# Patient Record
Sex: Female | Born: 1945 | Race: White | Hispanic: No | Marital: Married | Smoking: Never smoker
Health system: Southern US, Community
[De-identification: ages and names within clinical notes are randomized; demographics above are authoritative.]

## PROBLEM LIST (undated history)

## (undated) DIAGNOSIS — K219 Gastro-esophageal reflux disease without esophagitis: Secondary | ICD-10-CM

## (undated) DIAGNOSIS — R200 Anesthesia of skin: Secondary | ICD-10-CM

## (undated) DIAGNOSIS — K2270 Barrett's esophagus without dysplasia: Secondary | ICD-10-CM

## (undated) DIAGNOSIS — K449 Diaphragmatic hernia without obstruction or gangrene: Secondary | ICD-10-CM

## (undated) DIAGNOSIS — E039 Hypothyroidism, unspecified: Secondary | ICD-10-CM

## (undated) DIAGNOSIS — M75102 Unspecified rotator cuff tear or rupture of left shoulder, not specified as traumatic: Secondary | ICD-10-CM

## (undated) DIAGNOSIS — E785 Hyperlipidemia, unspecified: Secondary | ICD-10-CM

## (undated) DIAGNOSIS — K5790 Diverticulosis of intestine, part unspecified, without perforation or abscess without bleeding: Secondary | ICD-10-CM

## (undated) DIAGNOSIS — H9190 Unspecified hearing loss, unspecified ear: Secondary | ICD-10-CM

## (undated) DIAGNOSIS — M0520 Rheumatoid vasculitis with rheumatoid arthritis of unspecified site: Secondary | ICD-10-CM

## (undated) HISTORY — PX: HAMMER TOE SURGERY: SHX385

## (undated) HISTORY — PX: CATARACT EXTRACTION, BILATERAL: SHX1313

## (undated) HISTORY — DX: Diaphragmatic hernia without obstruction or gangrene: K44.9

## (undated) HISTORY — PX: IUD REMOVAL: SHX5392

## (undated) HISTORY — DX: Unspecified rotator cuff tear or rupture of left shoulder, not specified as traumatic: M75.102

## (undated) HISTORY — DX: Rheumatoid vasculitis with rheumatoid arthritis of unspecified site: M05.20

## (undated) HISTORY — DX: Unspecified hearing loss, unspecified ear: H91.90

## (undated) HISTORY — DX: Diverticulosis of intestine, part unspecified, without perforation or abscess without bleeding: K57.90

## (undated) HISTORY — PX: TUBAL LIGATION: SHX77

## (undated) HISTORY — DX: Anesthesia of skin: R20.0

## (undated) HISTORY — DX: Gastro-esophageal reflux disease without esophagitis: K21.9

## (undated) HISTORY — DX: Hyperlipidemia, unspecified: E78.5

## (undated) HISTORY — PX: TONSILLECTOMY: SUR1361

## (undated) HISTORY — DX: Hypothyroidism, unspecified: E03.9

## (undated) HISTORY — PX: INTRAUTERINE DEVICE INSERTION: SHX323

## (undated) HISTORY — PX: KNEE ARTHROPLASTY: SHX992

## (undated) HISTORY — DX: Barrett's esophagus without dysplasia: K22.70

---

## 1992-04-10 HISTORY — PX: MANDIBLE SURGERY: SHX707

## 1998-01-29 ENCOUNTER — Ambulatory Visit (HOSPITAL_COMMUNITY): Admission: RE | Admit: 1998-01-29 | Discharge: 1998-01-29 | Payer: Self-pay | Admitting: Obstetrics and Gynecology

## 1998-02-27 ENCOUNTER — Ambulatory Visit (HOSPITAL_COMMUNITY): Admission: RE | Admit: 1998-02-27 | Discharge: 1998-02-27 | Payer: Self-pay | Admitting: Obstetrics and Gynecology

## 1998-03-04 ENCOUNTER — Ambulatory Visit (HOSPITAL_COMMUNITY): Admission: RE | Admit: 1998-03-04 | Discharge: 1998-03-04 | Payer: Self-pay | Admitting: Obstetrics and Gynecology

## 1998-03-13 ENCOUNTER — Ambulatory Visit (HOSPITAL_COMMUNITY): Admission: RE | Admit: 1998-03-13 | Discharge: 1998-03-13 | Payer: Self-pay | Admitting: Obstetrics and Gynecology

## 1998-03-13 HISTORY — PX: LEFT OOPHORECTOMY: SHX1961

## 1998-08-06 ENCOUNTER — Other Ambulatory Visit: Admission: RE | Admit: 1998-08-06 | Discharge: 1998-08-06 | Payer: Self-pay | Admitting: Gastroenterology

## 1998-11-13 ENCOUNTER — Other Ambulatory Visit: Admission: RE | Admit: 1998-11-13 | Discharge: 1998-11-13 | Payer: Self-pay | Admitting: Gynecology

## 1999-05-21 ENCOUNTER — Encounter: Admission: RE | Admit: 1999-05-21 | Discharge: 1999-05-21 | Payer: Self-pay | Admitting: Gynecology

## 2000-01-27 ENCOUNTER — Other Ambulatory Visit: Admission: RE | Admit: 2000-01-27 | Discharge: 2000-01-27 | Payer: Self-pay | Admitting: Gynecology

## 2000-08-27 ENCOUNTER — Other Ambulatory Visit: Admission: RE | Admit: 2000-08-27 | Discharge: 2000-08-27 | Payer: Self-pay | Admitting: Gastroenterology

## 2000-10-06 ENCOUNTER — Encounter: Payer: Self-pay | Admitting: Gynecology

## 2000-10-06 ENCOUNTER — Encounter: Admission: RE | Admit: 2000-10-06 | Discharge: 2000-10-06 | Payer: Self-pay | Admitting: Gynecology

## 2001-03-29 ENCOUNTER — Other Ambulatory Visit: Admission: RE | Admit: 2001-03-29 | Discharge: 2001-03-29 | Payer: Self-pay | Admitting: Gynecology

## 2001-07-01 ENCOUNTER — Encounter: Payer: Self-pay | Admitting: Gastroenterology

## 2001-07-01 ENCOUNTER — Ambulatory Visit (HOSPITAL_COMMUNITY): Admission: RE | Admit: 2001-07-01 | Discharge: 2001-07-01 | Payer: Self-pay | Admitting: Gastroenterology

## 2002-03-28 ENCOUNTER — Encounter: Admission: RE | Admit: 2002-03-28 | Discharge: 2002-03-28 | Payer: Self-pay | Admitting: Gynecology

## 2002-03-28 ENCOUNTER — Encounter: Payer: Self-pay | Admitting: Gynecology

## 2002-04-27 ENCOUNTER — Other Ambulatory Visit: Admission: RE | Admit: 2002-04-27 | Discharge: 2002-04-27 | Payer: Self-pay | Admitting: Gynecology

## 2003-06-08 ENCOUNTER — Encounter: Admission: RE | Admit: 2003-06-08 | Discharge: 2003-06-08 | Payer: Self-pay | Admitting: Gynecology

## 2003-06-11 ENCOUNTER — Other Ambulatory Visit: Admission: RE | Admit: 2003-06-11 | Discharge: 2003-06-11 | Payer: Self-pay | Admitting: Gynecology

## 2004-01-03 ENCOUNTER — Ambulatory Visit (HOSPITAL_COMMUNITY): Admission: RE | Admit: 2004-01-03 | Discharge: 2004-01-03 | Payer: Self-pay | Admitting: Orthopedic Surgery

## 2004-01-03 ENCOUNTER — Ambulatory Visit (HOSPITAL_BASED_OUTPATIENT_CLINIC_OR_DEPARTMENT_OTHER): Admission: RE | Admit: 2004-01-03 | Discharge: 2004-01-03 | Payer: Self-pay | Admitting: Orthopedic Surgery

## 2004-11-03 ENCOUNTER — Other Ambulatory Visit: Admission: RE | Admit: 2004-11-03 | Discharge: 2004-11-03 | Payer: Self-pay | Admitting: Gynecology

## 2004-11-26 ENCOUNTER — Ambulatory Visit: Payer: Self-pay | Admitting: Gastroenterology

## 2005-11-21 ENCOUNTER — Emergency Department (HOSPITAL_COMMUNITY): Admission: EM | Admit: 2005-11-21 | Discharge: 2005-11-21 | Payer: Self-pay | Admitting: Emergency Medicine

## 2006-09-01 ENCOUNTER — Ambulatory Visit: Payer: Self-pay | Admitting: Gastroenterology

## 2007-04-06 ENCOUNTER — Ambulatory Visit: Payer: Self-pay | Admitting: Internal Medicine

## 2007-05-06 ENCOUNTER — Encounter: Payer: Self-pay | Admitting: Internal Medicine

## 2007-05-06 ENCOUNTER — Ambulatory Visit: Payer: Self-pay | Admitting: Internal Medicine

## 2007-11-04 DIAGNOSIS — E039 Hypothyroidism, unspecified: Secondary | ICD-10-CM | POA: Insufficient documentation

## 2007-11-04 DIAGNOSIS — E785 Hyperlipidemia, unspecified: Secondary | ICD-10-CM | POA: Insufficient documentation

## 2007-11-04 DIAGNOSIS — K219 Gastro-esophageal reflux disease without esophagitis: Secondary | ICD-10-CM | POA: Insufficient documentation

## 2007-11-04 DIAGNOSIS — K5909 Other constipation: Secondary | ICD-10-CM | POA: Insufficient documentation

## 2007-11-04 DIAGNOSIS — K5730 Diverticulosis of large intestine without perforation or abscess without bleeding: Secondary | ICD-10-CM | POA: Insufficient documentation

## 2007-11-04 DIAGNOSIS — K625 Hemorrhage of anus and rectum: Secondary | ICD-10-CM | POA: Insufficient documentation

## 2007-11-04 DIAGNOSIS — K2270 Barrett's esophagus without dysplasia: Secondary | ICD-10-CM | POA: Insufficient documentation

## 2007-11-04 DIAGNOSIS — K2980 Duodenitis without bleeding: Secondary | ICD-10-CM | POA: Insufficient documentation

## 2007-11-04 DIAGNOSIS — K449 Diaphragmatic hernia without obstruction or gangrene: Secondary | ICD-10-CM | POA: Insufficient documentation

## 2009-01-03 ENCOUNTER — Telehealth: Payer: Self-pay | Admitting: Internal Medicine

## 2009-01-09 ENCOUNTER — Ambulatory Visit: Payer: Self-pay | Admitting: Internal Medicine

## 2009-01-09 DIAGNOSIS — R1319 Other dysphagia: Secondary | ICD-10-CM | POA: Insufficient documentation

## 2009-01-14 ENCOUNTER — Encounter: Payer: Self-pay | Admitting: Internal Medicine

## 2009-01-14 ENCOUNTER — Ambulatory Visit (HOSPITAL_COMMUNITY): Admission: RE | Admit: 2009-01-14 | Discharge: 2009-01-14 | Payer: Self-pay | Admitting: Internal Medicine

## 2009-03-18 ENCOUNTER — Encounter: Payer: Self-pay | Admitting: Internal Medicine

## 2009-10-30 ENCOUNTER — Encounter: Admission: RE | Admit: 2009-10-30 | Discharge: 2009-10-30 | Payer: Self-pay | Admitting: Family Medicine

## 2009-10-30 ENCOUNTER — Encounter (INDEPENDENT_AMBULATORY_CARE_PROVIDER_SITE_OTHER): Payer: Self-pay | Admitting: *Deleted

## 2009-11-15 ENCOUNTER — Ambulatory Visit: Payer: Self-pay | Admitting: Internal Medicine

## 2009-11-27 ENCOUNTER — Encounter: Payer: Self-pay | Admitting: Internal Medicine

## 2009-12-09 ENCOUNTER — Telehealth: Payer: Self-pay | Admitting: Internal Medicine

## 2010-07-14 ENCOUNTER — Ambulatory Visit: Payer: Self-pay | Admitting: Internal Medicine

## 2010-07-15 ENCOUNTER — Telehealth: Payer: Self-pay | Admitting: Internal Medicine

## 2010-09-09 NOTE — Progress Notes (Signed)
Summary: rx ?'s  Medications Added ZEGERID 40-1100 MG CAPS (OMEPRAZOLE-SODIUM BICARBONATE) Take 1 tablet by mouth once daily (please d/c prescription for omeprazole)       Phone Note Call from Patient Call back at [phone]   Caller: Patient Call For: Dr. Juanda Chance Reason for Call: Talk to Nurse Summary of Call: pt says correct med is not at pharmacy[facility] [place] needs Omeprazole with Baking Soda Initial call taken by: Vallarie Mare,  July 15, 2010 2:25 PM  Follow-up for Phone Call        Dr Juanda Chance, your office note states that patient is to recieve prilosec which is what was sent. Patient states that you told her we would send zegerid. Is that what was intended to be sent? Follow-up by: Lamona Curl CMA Duncan Dull),  July 15, 2010 3:56 PM  Additional Follow-up for Phone Call Additional follow up Details #1::        It is a misunderstanding. Zegrid will be expensive, we discussed that. If she wants Zegrid, please send Zegrid  #30, 1 po, once daily, 3 refills Additional Follow-up by: Hart Carwin MD,  July 15, 2010 5:10 PM    Additional Follow-up for Phone Call Additional follow up Details #2::    Patient states that she would like to go ahead and get Zegerid prescription. I have d/c omeprazole prescription and have sent a new prescription for Zegerid to patient's pharmacy. Follow-up by: Lamona Curl CMA Duncan Dull),  July 16, 2010 8:57 AM  New/Updated Medications: ZEGERID 40-1100 MG CAPS (OMEPRAZOLE-SODIUM BICARBONATE) Take 1 tablet by mouth once daily (please d/c prescription for omeprazole) Prescriptions: ZEGERID 40-1100 MG CAPS (OMEPRAZOLE-SODIUM BICARBONATE) Take 1 tablet by mouth once daily (please d/c prescription for omeprazole)  #30 x 3   Entered by:   Lamona Curl CMA (AAMA)   Authorized by:   Hart Carwin MD   Signed by:   Lamona Curl CMA (AAMA) on 07/16/2010   Method used:   [facility]        [facility]  [place] [address] [facility]       [address] [place] [address]       [place], [place]  [address]       Ph: [phone] or [phone]       Fax: [phone]   RxID:   [id number]

## 2010-09-09 NOTE — Letter (Signed)
[facility]   Imported By: Lester [place] 12/02/2009 10:15:17  _____________________________________________________________________  External Attachment:    Type:   Image     Comment:   External Document

## 2010-09-09 NOTE — Assessment & Plan Note (Signed)
Summary: acid reflux--ch.    History of Present Illness Visit Type: Follow-up Visit Primary GI MD: Lina Sar MD Primary Provider: Bernardo Heater, MD Chief Complaint: GERD- Prevacid ineffective, patient wants to try something else History of Present Illness:   This is a 65 year old white female who has irritable bowel syndrome with predominant constipation and gastroesophageal reflux disease. Her most recent CT scan of the abdomen in April 2011 showed an increased stool burden. Her last colonoscopy by Dr. Marina Goodell in September 2008 was normal. She was told to have a tortuous colon. She was initially diagnosed with Barrett's esophagus but  an endoscopy in September 2008 showed only mild inflammation at the GE junction. A barium esophagram shows normal motility and no evidence of a stricture. An upper abdominal ultrasound showed a gallbladder wall thickness of 2-3 mm, a common bile duct of 4.2 mm and poorly visualized pancreas. Other medical problems include dyslipidemia, diverticulosis and hypothyroidism.   GI Review of Systems    Reports abdominal pain, acid reflux, and  bloating.     Location of  Abdominal pain: left side.    Denies belching, chest pain, dysphagia with liquids, dysphagia with solids, heartburn, loss of appetite, nausea, vomiting, vomiting blood, weight loss, and  weight gain.      Reports constipation.     Denies anal fissure, black tarry stools, change in bowel habit, diarrhea, diverticulosis, fecal incontinence, heme positive stool, hemorrhoids, irritable bowel syndrome, jaundice, light color stool, liver problems, rectal bleeding, and  rectal pain.    Current Medications (verified): 1)  Prevacid 30 Mg  Cpdr (Lansoprazole) .Marland Kitchen.. 1 Tablet By Mouth Once Daily (Please D/c Any 30 Day Prescription's For Prevacid) 2)  Levoxyl 75 Mcg Tabs (Levothyroxine Sodium) .... Take 1 Tablet By Mouth Once Daily 3)  Zyrtec Allergy 10 Mg Tabs (Cetirizine Hcl) .... One Tablet By Mouth Once  Daily 4)  Lovastatin 20 Mg Tabs (Lovastatin) .... One Tablet By Mouth Once Daily 5)  Restasis 0.05 % Emul (Cyclosporine) .... 2 Drops Once Daily 6)  Astelin 137 Mcg/spray Soln (Azelastine Hcl) .... As Needed 7)  Align  Caps (Misc Intestinal Flora Regulat) .... One Capsule By Mouth Once Daily 8)  Super B Complex  Tabs (B Complex-C) .... One Tablet By Mouth Once Daily 9)  Aspirin 81 Mg  Tabs (Aspirin) .... One Tablet By Mouth Once Daily 10)  Vitamin C 1000 Mg Tabs (Ascorbic Acid) .... 2 Tablets By Mouth Once Daily 11)  Calcium 500/d 500-200 Mg-Unit Tabs (Calcium Carbonate-Vitamin D) .... 2 Tablets By Mouth Once Daily 12)  Multivitamins   Tabs (Multiple Vitamin) .... One Tablet By Mouth Once Daily 13)  Biotin 5000 5 Mg Caps (Biotin) .... 2 Tablets By Mouth Once Daily 14)  Magnesium 500 Mg Tabs (Magnesium) .... Once Daily 15)  Bentyl 20 Mg Tabs (Dicyclomine Hcl) .... One Tablet By Mouth Every Night (Please C/d Any 30 Day Prescription's W/refills For Bentyl) 16)  Allergy Medication 12.5 Mg/87ml Liqd (Diphenhydramine Hcl) .... Injection Once Weekly 17)  Zyrtec Hives Relief 10 Mg Tabs (Cetirizine Hcl) .... Once Daily  Allergies (verified): 1)  ! Prednisone  Past History:  Past Medical History: Reviewed history from 11/04/2007 and no changes required. 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 Jaw Surgery Tonsillectomy Tubal Ligation Palett Expansion IUD Removal  Family History: Reviewed history from 01/09/2009 and no changes required. Family History of Heart Disease: Mother, Aunt x 2 No FH of Colon Cancer:  Social History: Reviewed history from 01/09/2009  and no changes required. Married Illicit Drug Use - no Patient has never smoked.  Alcohol Use - no Daily Caffeine Use  Review of Systems       The patient complains of allergy/sinus and back pain.  The patient denies anemia, anxiety-new, arthritis/joint pain, blood in urine, breast changes/lumps, change in vision, confusion, cough, coughing up blood, depression-new, fainting, fatigue, fever, headaches-new, hearing problems, heart murmur, heart rhythm changes, itching, menstrual pain, muscle pains/cramps, night sweats, nosebleeds, pregnancy symptoms, shortness of breath, skin rash, sleeping problems, sore throat, swelling of feet/legs, swollen lymph glands, thirst - excessive , urination - excessive , urination changes/pain, urine leakage, vision changes, and voice change.         Pertinent positive and negative review of systems were noted in the above HPI. All other ROS was otherwise negative.   Vital Signs:  Patient profile:   65 year old female Height:      61 inches Weight:      149.38 pounds BMI:     28.33 Pulse rate:   72 / minute Pulse rhythm:   regular BP sitting:   110 / 64  (left arm) Cuff size:   regular  Vitals Entered By: June McMurray CMA Duncan Dull) (July 14, 2010 1:52 PM)  Physical Exam  General:  Well developed, well nourished, no acute distress. Eyes:  PERRLA, no icterus. Mouth:  blue nevus right upper lip Neck:  Supple; no masses or thyromegaly. Lungs:  Clear throughout to auscultation. Heart:  Regular rate and rhythm; no murmurs, rubs,  or bruits. Abdomen:  soft nontender abdomen with normoactive bowel sounds. Minimal discomfort left upper quadrant. No pulsations. No CVA tenderness. Liver edge at costal margin. Rectal:  normal perianal area with soft Hemoccult negative stool. Extremities:  No clubbing, cyanosis, edema or deformities noted. Skin:  Intact without significant lesions or rashes. Psych:  Alert and cooperative. Normal mood and  affect.   Impression & Recommendations:  Problem # 1:  BARRETTS ESOPHAGUS (ICD-530.85) There was  no evidence of Barrett's esophagus on the last upper endoscopy in 2008. She wants  to switch from Prevacid to omeprazole 20 mg daily. She will continue on antireflux measures.  Problem # 2:  RECTAL BLEEDING (ICD-569.3) Paient had Hemoccult negative stool today. She has irritable bowel syndrome with predominant constipation. We will call in MiraLax 17 g daily and she can titrate the dose to modify her bowel habits.  Patient Instructions: 1)  Continue a high-fiber diet. 2)  Continue fiber supplements. 3)  MiraLax 17 g daily. You may titrate the dose as needed. 4)  Switch from Prevacid to Prilosec 20 mg daily. 5)  Recall colonoscopy September 2018. 6)  Copy sent to :Dr Kirtland Bouchard.Kaplan  7)  The medication list was reviewed and reconciled.  All changed / newly prescribed medications were explained.  A complete medication list was provided to the patient / caregiver. Prescriptions: OMEPRAZOLE 20 MG CPDR (OMEPRAZOLE) Take 1 tablet by mouth once daily  #30 x 2   Entered by:   Lamona Curl CMA (AAMA)   Authorized by:   Hart Carwin MD   Signed by:   Lamona Curl CMA (AAMA) on 07/14/2010   Method used:   [facility]        [facility]  [place] [address]  [address]       [place], [place]  [address]       Ph: [phone] or [phone]       Fax: [phone]   RxID:   [id number] MIRALAX  POWD (POLYETHYLENE GLYCOL 3350) Dissolve 17 grams (1 capful) in at least 8 ounces water/juice and drink once daily. May titrate the dose as needed.  #527 grams x 2   Entered by:   Lamona Curl CMA (AAMA)   Authorized by:   Hart Carwin MD   Signed by:   Lamona Curl CMA (AAMA) on 07/14/2010   Method used:   [facility]        [facility]  [place] [address] [facility]       [address]       [place], [place]  [address]       Ph: [phone] or [phone]       Fax:  [phone]   RxID:   [id number]

## 2010-09-09 NOTE — Progress Notes (Signed)
Summary: refill  Medications Added PREVACID 30 MG  CPDR (LANSOPRAZOLE) 1 tablet by mouth once daily (please d/c any 30 day prescription's for prevacid) BENTYL 20 MG TABS (DICYCLOMINE HCL) one tablet by mouth every morning. (please c/d any 30 day prescription's w/refills for bentyl)       Phone Note Call from Patient Call back at [facility] Phone [phone]   Caller: Patient Call For: Dr. Juanda Chance Reason for Call: Talk to Nurse Summary of Call: Lansoprazole DR and Dicyclomine... need 90 day supply... [facility] [place] Initial call taken by: Vallarie Mare,  Dec 09, 2009 12:30 PM    New/Updated Medications: PREVACID 30 MG  CPDR (LANSOPRAZOLE) 1 tablet by mouth once daily (please d/c any 30 day prescription's for prevacid) BENTYL 20 MG TABS (DICYCLOMINE HCL) one tablet by mouth every morning. (please c/d any 30 day prescription's w/refills for bentyl) Prescriptions: BENTYL 20 MG TABS (DICYCLOMINE HCL) one tablet by mouth every morning. (please c/d any 30 day prescription's w/refills for bentyl)  #90 x 3   Entered by:   Lamona Curl CMA (AAMA)   Authorized by:   Hart Carwin MD   Signed by:   Lamona Curl CMA (AAMA) on 12/09/2009   Method used:   [facility]        [facility]  [place] [address] [facility]       [address]       [place], [place]  [address]       Ph: [phone] or [phone]       Fax: [phone]   RxID:   [id number] PREVACID 30 MG  CPDR (LANSOPRAZOLE) 1 tablet by mouth once daily (please d/c any 30 day prescription's for prevacid)  #90 x 1   Entered by:   Lamona Curl CMA (AAMA)   Authorized by:   Hart Carwin MD   Signed by:   Lamona Curl CMA (AAMA) on 12/09/2009   Method used:   [facility]        [facility]  [place] [address]       [address]       [place], [place]  [address]       Ph: [phone] or [phone]       Fax: [phone]   RxID:   [id number]

## 2010-09-09 NOTE — Assessment & Plan Note (Signed)
Summary: GI PROBLEMS/YF    History of Present Illness Visit Type: Follow-up Visit Primary GI MD: Lina Sar MD Primary Provider: Bernardo Heater, MD Chief Complaint: Sluggish bowels x2 weeks, bloating & gas, patient never fells that she empties her bowels History of Present Illness:   65 year old white female with him gastroesophageal reflux currently controlled on Prevacid 30 mg once a day. She has developed abdominal bloating, frequent soft stools and an uncomfortable feeling in her lower abdomen which has not gone away with probiotics or with the laxative. CT Scan of the abdomen last week was essentially normal except for increased stool in her colon. Last colonoscopy in September 2008 was normal. She is being treated for low back pain , s/p  steroid injection. She feels that her symptoms started after the steroid injection   GI Review of Systems    Reports bloating and  nausea.      Denies abdominal pain, acid reflux, belching, chest pain, dysphagia with liquids, dysphagia with solids, heartburn, loss of appetite, vomiting, vomiting blood, weight loss, and  weight gain.        Denies anal fissure, black tarry stools, change in bowel habit, constipation, diarrhea, diverticulosis, fecal incontinence, heme positive stool, hemorrhoids, irritable bowel syndrome, jaundice, light color stool, liver problems, rectal bleeding, and  rectal pain. Visit Type:  Follow-up Visit Primary Care Provider:  Bernardo Heater, MD  Chief Complaint:  Sluggish bowels x2 weeks, bloating & gas, and patient never fells that she empties her bowels.  History of Present Illness: ristian    Current Medications (verified): 1)  Prevacid 30 Mg  Cpdr (Lansoprazole) .Marland Kitchen.. 1 Tablet By Mouth Once Daily 2)  Levoxyl 75 Mcg Tabs (Levothyroxine Sodium) .... Take 1 Tablet By Mouth Once Daily 3)  Singulair 10 Mg Tabs (Montelukast Sodium) .... One Tablet By Mouth Once Daily 4)  Zyrtec Allergy 10 Mg Tabs (Cetirizine Hcl)  .... One Tablet By Mouth Once Daily 5)  Lovastatin 20 Mg Tabs (Lovastatin) .... One Tablet By Mouth Once Daily 6)  Restasis 0.05 % Emul (Cyclosporine) .... 2 Drops Once Daily 7)  Astelin 137 Mcg/spray Soln (Azelastine Hcl) .... As Needed 8)  Align  Caps (Misc Intestinal Flora Regulat) .... One Capsule By Mouth Once Daily 9)  Super B Complex  Tabs (B Complex-C) .... One Tablet By Mouth Once Daily 10)  Aspirin 81 Mg  Tabs (Aspirin) .... One Tablet By Mouth Once Daily 11)  Vitamin C 1000 Mg Tabs (Ascorbic Acid) .... 2 Tablets By Mouth Once Daily 12)  Calcium 500/d 500-200 Mg-Unit Tabs (Calcium Carbonate-Vitamin D) .... 2 Tablets By Mouth Once Daily 13)  Multivitamins   Tabs (Multiple Vitamin) .... One Tablet By Mouth Once Daily 14)  Biotin 5000 5 Mg Caps (Biotin) .... 2 Tablets By Mouth Once Daily 15)  Magnesium 500 Mg Tabs (Magnesium) .... Once Daily  Allergies (verified): 1)  ! Prednisone  Past History:  Past Medical History: Reviewed history from 11/04/2007 and no changes required. 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 Jaw Surgery Tonsillectomy Tubal Ligation Palett Expansion IUD Removal  Family History: Reviewed history from 01/09/2009 and no changes required. Family History of Heart Disease: Mother, Aunt x 2 No FH of Colon Cancer:  Social History: Reviewed history from 01/09/2009 and no changes required. Married Illicit Drug Use -  no Patient has never smoked.  Alcohol Use - no Daily Caffeine Use  Review of Systems       The patient complains of allergy/sinus, arthritis/joint pain, back pain, night sweats, nosebleeds, and urine  leakage.  The patient denies anemia, anxiety-new, blood in urine, breast changes/lumps, change in vision, confusion, cough, coughing up blood, depression-new, fainting, fatigue, fever, headaches-new, hearing problems, heart murmur, heart rhythm changes, itching, menstrual pain, muscle pains/cramps, pregnancy symptoms, shortness of breath, skin rash, sleeping problems, sore throat, swelling of feet/legs, swollen lymph glands, thirst - excessive , urination - excessive , urination changes/pain, vision changes, and voice change.         Pertinent positive and negative review of systems were noted in the above HPI. All other ROS was otherwise negative.   Vital Signs:  Patient profile:   65 year old female Height:      61 inches Weight:      146.13 pounds BMI:     27.71 Pulse rate:   84 / minute Pulse rhythm:   regular BP sitting:   106 / 70  (left arm) Cuff size:   regular  Vitals Entered By: June McMurray CMA Duncan Dull) (November 15, 2009 11:22 AM)  Physical Exam  General:  Well developed, well nourished, no acute distress. Mouth:  No deformity or lesions, dentition normal. Neck:  Supple; no masses or thyromegaly. Lungs:  Clear throughout to auscultation. Heart:  Regular rate and rhythm; no murmurs, rubs,  or bruits. Abdomen:  soft abdomen with tenderness along the left colon especially sigmoid colon and left lower quadrant. Bowel sounds are hyperactive and there is mild tympany in lower abdomen. There is no rebound. Liver edge at costal margin Rectal:  normal rectal tone, small amount of well-formed Hemoccult-negative stool in the ampulla Msk:  Symmetrical with no gross deformities. Normal posture. Neurologic:  Alert and  oriented x4;  grossly normal neurologically. Skin:  Intact without significant lesions or rashes. Psych:  Alert and cooperative. Normal mood and affect.   Impression & Recommendations:  Problem # 1:  DYSPHAGIA (ICD-787.29) no symptoms, continue Prevacid 30 mg  daily  Problem # 2:  CONSTIPATION, CHRONIC (ICD-564.09) irritable bowel syndrome. Will add Bentyl 20 mg q.a.m. and Benefiber  one heaping teaspoon every morning. Continuing probiotics daily. She is up to date on  her colonoscopy  Patient Instructions: 1)  Bentyl 20 mg q.a.m. 2)  Probiotics daily 3)  Benefiber one heaping teaspoon daily 4)  Call back if no improvement in 2 weeks 5)  Copy sent to : Dr Bernardo Heater 6)  The medication list was reviewed and reconciled.  All changed / newly prescribed medications were explained.  A complete medication list was provided to the patient / caregiver. Prescriptions: BENTYL 20 MG TABS (DICYCLOMINE HCL) one tablet by mouth every morning  #30 x 11   Entered and Authorized by:   Hart Carwin MD   Signed by:   Hart Carwin MD on 11/16/2009   Method used:   [facility]        [facility]  [place] [address] [facility]       [address]       [place], [place]  [address]       Ph: [phone] or [phone]       Fax: [phone]   RxID:   [id number]

## 2010-12-23 NOTE — Assessment & Plan Note (Signed)
Linda Berg                         GASTROENTEROLOGY OFFICE NOTE   Linda, Berg Linda Berg                MRN:          [id number]  DATE:04/06/2007                            DOB:          [birth year]    This is a 65 year old white female with a history of gastroesophageal  reflux disease with questionable Barrett's esophagus, hypothyroidism,  dyslipidemia, incidental diverticulosis and chronic constipation.  She  has been a longstanding patient of Dr. Victorino Dike.  She is a new  patient to me.   CHIEF COMPLAINT:  Today is loose stools, lower abdominal discomfort and  minor rectal bleeding.   Her last colonoscopy was performed in November of 2002.  This was normal  except for internal hemorrhoids, followup in 5 years recommended.  She  has had 3 separate endoscopies.  One in 1999, one in 2002 and one in  2004.  The initial exam revealed evidence of Barrett's esophagus on  biopsy, subsequent exams did not demonstrate Barrett's esophagus.  For  her reflux disease she has been maintained on Prevacid.  On Prevacid she  does have occasional breakthrough heartburn.  No nausea, vomiting or  dysphagia.  Earlier this year the patient was placed on Amitiza for  constipation.  Taking 2 Amitiza a day led to diarrhea.  She subsequently  changed to 1 Amitiza a day which resulted in more regulated bowel  habits.  However, in recent weeks she began to develop loose stools.  She discontinued Amitiza approximately 2-3 weeks ago.  She also  discontinued Citrucel.  She describes her stools as still loose and  fragmented.  She has seen some red blood on the tissue only.  She also  complains of increased intestinal gas as well as lower abdominal and  back pain.  Prior ST-T scan in 2002 was negative.  She denies recent  antibiotic exposure, travel or weight loss.  She is not self medicated  for her symptoms.  She is concerned about the possibility of gallbladder  disease, ulcer disease or cancer.   ALLERGIES:  PREDNISONE INTOLERANCE.   CURRENT MEDICATIONS:  1. Prevacid 30 mg daily.  2. Synthroid 50 mcg daily.  3. Singulair 10 mg daily.  4. Zyrtec 10 mg daily.  5. Lovastatin 20 mg daily.  6. Restasis eye drops.  7. Metronidazole eye drops.  8. Allergy shots once weekly.  9. Calcium.  10.Vitamin C.  11.B-complex.  12.Biotin.  13.Multivitamin.  14.Astelin spray.  15.Baby aspirin.   PHYSICAL EXAMINATION:  Well-appearing female in no acute distress.  Blood pressure is 112/78, heart rate is 80 and regular, weight is 151.2  pounds.  HEENT:  Sclerae are anicteric, conjunctivae are pink, oral mucosa is  intact, there is a birthmark on the lower side of the right lip.  LUNGS:  Clear to auscultation and percussion.  HEART:  Regular without murmur.  ABDOMEN:  Soft, nontender, nondistended with good bowel sounds.  No  organomegaly, mass or hernia.  RECTAL:  Deferred.  EXTREMITIES:  Without edema.   IMPRESSION:  1. Recent problems with loose stools of uncertain clinical      significance.  No worrisome features.  Suspect variant of irritable      bowel.  2. Minor intermittent rectal bleeding.  Likely due to hemorrhoids.      Rule out more significant mucosal pathology such as proctitis or      neoplasia.  3. Gastroesophageal reflux disease.  Some breakthrough symptoms on      Prevacid.  4. Questionable history of Barrett's esophagus.   RECOMMENDATIONS:  1. Empiric trial of the probiotic Align 1 daily for 2 weeks.  2. Schedule colonoscopy to evaluate change in bowel habits, bleeding      and provide neoplasia screening.  The nature of the procedure as      well as the risks, benefits and alternatives have been reviewed.      She understood and agreed to proceed.  3. Continue Prevacid.  4. Reflux precautions.  5. Schedule upper endoscopy to survey for possible Barrett's and      evaluate persistent reflux symptoms despite proton pump  inhibitors.  6. Ongoing general medical care with Dr. Larina Bras.     Linda Berg. Linda Goodell, MD  Electronically Signed    Linda Berg  DD: 04/06/2007  DT: 04/06/2007  Job #: [id number]   cc:   Linda Gins, MD

## 2010-12-26 NOTE — Op Note (Signed)
NAME:  Linda Berg, Linda Berg                        ACCOUNT NO.:  [id number]   MEDICAL RECORD NO.:  [id number]                   PATIENT TYPE:  AMB   LOCATION:  DSC                                  FACILITY:  [facility]   PHYSICIAN:  Katy Fitch. Naaman Plummer., M.D.          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   OPERATION:  1. Debridement of mucous cyst, left long finger distal interphalangeal     joint.  2. Distal interphalangeal joint arthrotomy with removal of loose body and     debridement of marginal osteophytes, radial aspect of left long finger     distal interphalangeal joint.   OPERATING SURGEON:  Katy Fitch. Sypher, M.D.   ASSISTANT:  Linda Berg, P.A.   ANESTHESIA:  0.25% Marcaine and 2% lidocaine metacarpal head-level block  supplemented by IV sedation, supervising anesthesiologist is Burna Forts, M.D.   INDICATIONS:  Linda Berg is a 65 year old woman referred by Dr. Venancio Poisson for evaluation and management of a mucous cyst involving the left long  finger DIP joint region.  She had had a history of a waxing and waning  mucous cyst.  She did not have substantial nail deformity.   Dr. Nicholas Lose kindly referred her for an upper extremity orthopedic consult.   X-ray of the left long finger demonstrated marginal osteophytes with bone-on-  bone arthropathy and what appeared to be a probable loose body.   PROCEDURE:  Linda Berg is brought to the operating room and placed in  the supine position upon  the operating table.  Following light sedation  0.25% Marcaine and 2% lidocaine were infiltrated at the metacarpal head  level to obtain a digital block.   When anesthesia was satisfactory, the procedure commenced with routine  Betadine scrub and paint, followed by exsanguination of the long finger with  a gauze wrap and placement of a strip of Esmarch bandage at the base of the  finger as a digital tourniquet.   The procedure commenced with a curvilinear incorporating the cyst.  Subcutaneous tissues were carefully divided revealing the extensor tendon  and radial collateral ligament region.   The capsule between the radial collateral ligament and the central extensor  tendon slip was excised.  A significant osteophyte was noted on the base of  the distal phalanx.  This was removed by the House curette, followed by  extrusion of a sizeable loose body.   The wound was then irrigated thoroughly with irrigation of the DIP joint  until no other debris was encountered.   The wound was then repaired with interrupted suture of 5-0 nylon.   A compressive dressing was applied with Xeroflo, sterile gauze, and Coban.   For aftercare Linda Berg is advised to elevate her hand for 24 hours.  She  will work on finger range of motion exercises and return to the office for  follow-up for suture removal in seven to nine days.                                               Katy Fitch Naaman Plummer., M.D.    RVS/MEDQ  D:  01/03/2004  T:  01/04/2004  Job:  [id number]

## 2010-12-26 NOTE — Assessment & Plan Note (Signed)
[facility]                         GASTROENTEROLOGY OFFICE NOTE   [name], [name]                MRN:          [id number]  DATE:09/01/2006                            DOB:          February 18, 1946    Linda Berg comes in on January 23 and says she has more indigestion and also  some constipation. Her symptoms are basically constipation. She says has  not gotten any better. She has had this chronic constipation secondary  to her elongated redundant colon for quite some time. 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 She also has GERD with  questionable Barrett's.   PHYSICAL EXAMINATION:  Today, she is 153, blood pressure is 104/68,  pulse 88 and regular.  NECK and HEENT are  basically unchanged.  EXTREMITIES: Are unremarkable.  RECTAL: Deferred.   IMPRESSION:  I have recommended that she take the Amitiza 2 times a day  or add some FloraQ because she said she is having so much gas and I  questioned about using some Xifaxan, but I told her take Senokot with  senna every night to get her bowel activity to be more regulated. She  takes a number of pills which could be influential here which include  Prevacid, Synthroid, Singulair, Zyrtec, lovastatin, Restasis,  metronidazole, allergy shots, multiple vitamins along with a baby  aspirin. Hopefully the above measures will be helpful to her. To let us  know some time in the near future if she is not improved. Hopefully this  will get rid of some of her indigestion as well. What she talked to me  about was mostly problems with gas.     Ulyess Mort, MD  Electronically Signed    SML/MedQ  DD: 09/01/2006  DT: 09/01/2006  Job #: [id number]

## 2011-03-05 ENCOUNTER — Other Ambulatory Visit: Payer: Self-pay | Admitting: Gynecology

## 2011-08-14 ENCOUNTER — Other Ambulatory Visit: Payer: Self-pay | Admitting: Internal Medicine

## 2011-08-18 ENCOUNTER — Telehealth: Payer: Self-pay | Admitting: *Deleted

## 2011-08-18 NOTE — Telephone Encounter (Signed)
Message copied by Richardson Chiquito on Tue Aug 18, 2011  8:06 AM ------      Message from: Hart Carwin      Created: Mon Aug 17, 2011 10:15 PM      Regarding: recall EGD       Barrett's esophagus on EGD 1999, no Barrett's inn 2002,2004 and 2008. Normal Ba esophagram 2010. GERD refractory to PPI's. Last time we discussed recall EGD  , we planned 5 year recall, which will be in Sept 2013.      ----- Message -----         From: Vernia Buff, CMA         Sent: 08/17/2011   8:28 AM           To: Hart Carwin, MD            Dr Juanda Chance-      This patient apparently has a questionable history of Barretts esophagus (although I cannot see what date this was seen originally). A procedure by Dr Marina Goodell in 2008 showed questionable short segment Barrett's but was not confirmed on biopsy. Does patient need repeat EGD with ? History Barrett's or is she okay without one. I dont see a recall in the system, so just wondering.Marland KitchenMarland Kitchen

## 2011-08-28 ENCOUNTER — Other Ambulatory Visit: Payer: Self-pay | Admitting: *Deleted

## 2011-08-28 MED ORDER — OMEPRAZOLE 20 MG PO CPDR: 20.0000 mg | DELAYED_RELEASE_CAPSULE | Freq: Every day | ORAL | Status: DC

## 2011-08-28 NOTE — Telephone Encounter (Signed)
rx sent

## 2011-12-25 HISTORY — PX: KNEE ARTHROSCOPY: SUR90

## 2012-02-03 ENCOUNTER — Other Ambulatory Visit: Payer: Self-pay | Admitting: *Deleted

## 2012-02-03 MED ORDER — OMEPRAZOLE 20 MG PO CPDR: 20.0000 mg | DELAYED_RELEASE_CAPSULE | Freq: Every day | ORAL | Status: DC

## 2012-04-18 ENCOUNTER — Other Ambulatory Visit: Payer: Self-pay | Admitting: *Deleted

## 2012-04-18 MED ORDER — OMEPRAZOLE 20 MG PO CPDR: DELAYED_RELEASE_CAPSULE | ORAL | Status: DC

## 2012-07-13 ENCOUNTER — Ambulatory Visit: Payer: Self-pay | Admitting: Gynecology

## 2012-07-20 ENCOUNTER — Other Ambulatory Visit: Payer: Self-pay | Admitting: Obstetrics & Gynecology

## 2012-07-20 DIAGNOSIS — Z1231 Encounter for screening mammogram for malignant neoplasm of breast: Secondary | ICD-10-CM

## 2012-08-24 ENCOUNTER — Other Ambulatory Visit: Payer: Self-pay | Admitting: *Deleted

## 2012-08-24 MED ORDER — OMEPRAZOLE 20 MG PO CPDR: DELAYED_RELEASE_CAPSULE | ORAL | Status: DC

## 2012-08-24 NOTE — Telephone Encounter (Signed)
Patient has been advised that we have not seen her in a little over 2 years. We need to see her in the office for further refills. Patient has scheduled an appointment for 09/09/12 @ 2 pm and I will send a 1 month supply of omeprazole to [facility] in [place] until we can see her in the office for follow up.

## 2012-08-25 ENCOUNTER — Ambulatory Visit: Payer: Self-pay

## 2012-08-25 ENCOUNTER — Encounter: Payer: Self-pay | Admitting: *Deleted

## 2012-09-07 ENCOUNTER — Ambulatory Visit
Admission: RE | Admit: 2012-09-07 | Discharge: 2012-09-07 | Disposition: A | Discharge status: Home / Self Care | Payer: Medicare Other | Source: Ambulatory Visit | Attending: Obstetrics & Gynecology | Admitting: Obstetrics & Gynecology

## 2012-09-07 DIAGNOSIS — Z1231 Encounter for screening mammogram for malignant neoplasm of breast: Secondary | ICD-10-CM

## 2012-09-08 ENCOUNTER — Other Ambulatory Visit: Payer: Self-pay | Admitting: Obstetrics & Gynecology

## 2012-09-08 DIAGNOSIS — R928 Other abnormal and inconclusive findings on diagnostic imaging of breast: Secondary | ICD-10-CM

## 2012-09-09 ENCOUNTER — Encounter: Payer: Self-pay | Admitting: Internal Medicine

## 2012-09-09 ENCOUNTER — Ambulatory Visit (INDEPENDENT_AMBULATORY_CARE_PROVIDER_SITE_OTHER): Payer: Medicare Other | Admitting: Internal Medicine

## 2012-09-09 VITALS — BP 96/54 | HR 80 | Ht 61.0 in | Wt 152.2 lb

## 2012-09-09 DIAGNOSIS — K3189 Other diseases of stomach and duodenum: Secondary | ICD-10-CM

## 2012-09-09 DIAGNOSIS — K2270 Barrett's esophagus without dysplasia: Secondary | ICD-10-CM

## 2012-09-09 DIAGNOSIS — K5900 Constipation, unspecified: Secondary | ICD-10-CM

## 2012-09-09 DIAGNOSIS — R1013 Epigastric pain: Secondary | ICD-10-CM

## 2012-09-09 MED ORDER — OMEPRAZOLE 20 MG PO CPDR: 20.0000 mg | DELAYED_RELEASE_CAPSULE | Freq: Every day | ORAL | Status: DC

## 2012-09-09 MED ORDER — POLYETHYLENE GLYCOL 3350 17 GM/SCOOP PO POWD: 17.0000 g | Freq: Every day | ORAL | Status: DC

## 2012-09-09 NOTE — Progress Notes (Signed)
Linda Berg 01/07/1946 MRN [id number]   History of Present Illness:  This is a 67 year old white female who comes for refills of omeprazole 20 mg daily. She has history of Barrett's esophagus in 1999 on an upper endoscopy which showed intestinal metaplasia. Subsequent endoscopies in 2002, 2004 and 2008 did not show any evidence of Barrett's esophagus. She is complaining of belching and dyspepsia. Her colonoscopy in September 2008 was normal. There is a positive family history of gallbladder disease in her father. She has had chronic constipation for which she takes fiber caps 3 and day, magnesium tablets 250 mg twice a day, and a  probiotic. She denies nocturnal cough, dysphagia or odynophagia.   Past Medical History  Diagnosis Date  . GERD (gastroesophageal reflux disease)   . Barrett esophagus   . Hiatal hernia   . Diverticulosis   . Dyslipidemia   . Hypothyroidism    Past Surgical History  Procedure Date  . Mandible surgery 9/93    pallete expansion  . Tonsillectomy   . Tubal ligation   . Intrauterine device insertion   . Iud removal   . Left oophorectomy 03/13/98  . Knee arthroscopy 12/25/11    left    reports that she has never smoked. She has never used smokeless tobacco. She reports that she does not drink alcohol or use illicit drugs. family history includes Heart disease in her maternal aunt and mother.  There is no history of Colon cancer. Allergies  Allergen Reactions  . Prednisone         Review of Systems: Positive for constipation negative for rectal bleeding  The remainder of the 10 point ROS is negative except as outlined in H&P   Physical Exam: General appearance  Well developed, in no distress. Eyes- non icteric. HEENT nontraumatic, normocephalic. Mouth no lesions, tongue papillated, no cheilosis. Neck supple without adenopathy, thyroid not enlarged, no carotid bruits, no JVD. Lungs Clear to auscultation bilaterally. Cor normal S1, normal S2,  regular rhythm, no murmur,  quiet precordium. Abdomen: Soft mildly distended with increased tympany in upper abdomen. Normoactive bowel sounds. No tenderness or mass. Liver edge at costal margin. Rectal: Not done. Extremities no pedal edema. Skin no lesions. Neurological alert and oriented x 3. Psychological normal mood and affect.  Assessment and Plan:  Problem #1 Dyspepsia and bloating with history of gastroesophageal reflux. We will refill her omeprazole 20 mg daily. We will also obtain an upper abdominal ultrasound to rule out symptomatic gallbladder disease. If that is negative, patient may wish to have a repeat endoscopy. She will make a decision on that after the ultrasound.  Problem #2 Chronic constipation. We will start MiraLax 9-17 g several times a week. She is to continue fiber supplements, fluids and increase her activity.  Problem #3 Rheumatoid arthritis on meloxicam and Plaquanil. She is followed by Dr. Arlyce Dice.   09/09/2012 Linda Berg

## 2012-09-09 NOTE — Patient Instructions (Addendum)
We have sent the following medications to your mail in pharmacy: Omeprazole Miralax  You have been scheduled for an abdominal ultrasound at [facility] Radiology (1st floor of hospital) on Tuesday, 09/13/12 at 8:30 am. Please arrive 15 minutes prior to your appointment for registration. Make certain not to have anything to eat or drink 6 hours prior to your appointment. Should you need to reschedule your appointment, please contact radiology at [phone]. This test typically takes about 30 minutes to perform.   CC: Dr Mady Gemma

## 2012-09-13 ENCOUNTER — Ambulatory Visit (HOSPITAL_COMMUNITY): Payer: Medicare Other

## 2012-09-13 ENCOUNTER — Ambulatory Visit (HOSPITAL_COMMUNITY)
Admission: RE | Admit: 2012-09-13 | Discharge: 2012-09-13 | Disposition: A | Discharge status: Home / Self Care | Payer: Medicare Other | Source: Ambulatory Visit | Attending: Internal Medicine | Admitting: Internal Medicine

## 2012-09-13 DIAGNOSIS — R1013 Epigastric pain: Secondary | ICD-10-CM

## 2012-09-13 DIAGNOSIS — K3189 Other diseases of stomach and duodenum: Secondary | ICD-10-CM | POA: Insufficient documentation

## 2012-09-21 ENCOUNTER — Ambulatory Visit
Admission: RE | Admit: 2012-09-21 | Discharge: 2012-09-21 | Disposition: A | Discharge status: Home / Self Care | Payer: Medicare Other | Source: Ambulatory Visit | Attending: Obstetrics & Gynecology | Admitting: Obstetrics & Gynecology

## 2012-09-21 DIAGNOSIS — R928 Other abnormal and inconclusive findings on diagnostic imaging of breast: Secondary | ICD-10-CM

## 2013-02-24 ENCOUNTER — Other Ambulatory Visit (INDEPENDENT_AMBULATORY_CARE_PROVIDER_SITE_OTHER): Payer: Medicare Other

## 2013-02-24 ENCOUNTER — Encounter: Payer: Self-pay | Admitting: Physician Assistant

## 2013-02-24 ENCOUNTER — Telehealth: Payer: Self-pay

## 2013-02-24 ENCOUNTER — Telehealth: Payer: Self-pay | Admitting: Internal Medicine

## 2013-02-24 ENCOUNTER — Ambulatory Visit (INDEPENDENT_AMBULATORY_CARE_PROVIDER_SITE_OTHER): Payer: Medicare Other | Admitting: Physician Assistant

## 2013-02-24 ENCOUNTER — Ambulatory Visit (INDEPENDENT_AMBULATORY_CARE_PROVIDER_SITE_OTHER)
Admission: RE | Admit: 2013-02-24 | Discharge: 2013-02-24 | Disposition: A | Discharge status: Home / Self Care | Payer: Medicare Other | Source: Ambulatory Visit | Attending: Physician Assistant | Admitting: Physician Assistant

## 2013-02-24 VITALS — BP 112/70 | HR 96 | Ht 61.0 in | Wt 151.0 lb

## 2013-02-24 DIAGNOSIS — R112 Nausea with vomiting, unspecified: Secondary | ICD-10-CM

## 2013-02-24 DIAGNOSIS — K5900 Constipation, unspecified: Secondary | ICD-10-CM

## 2013-02-24 DIAGNOSIS — R109 Unspecified abdominal pain: Secondary | ICD-10-CM

## 2013-02-24 LAB — CBC WITH DIFFERENTIAL/PLATELET
Basophils Relative: 0.6 % (ref 0.0–3.0)
Eosinophils Relative: 2.7 % (ref 0.0–5.0)
HCT: 41.5 % (ref 36.0–46.0)
Lymphs Abs: 1.4 10*3/uL (ref 0.7–4.0)
MCV: 97.2 fl (ref 78.0–100.0)
Monocytes Relative: 9.8 % (ref 3.0–12.0)
Platelets: 295 10*3/uL (ref 150.0–400.0)
RBC: 4.27 Mil/uL (ref 3.87–5.11)
WBC: 4.9 10*3/uL (ref 4.5–10.5)

## 2013-02-24 LAB — BASIC METABOLIC PANEL
BUN: 15 mg/dL (ref 6–23)
CO2: 30 mEq/L (ref 19–32)
Calcium: 9.5 mg/dL (ref 8.4–10.5)
Glucose, Bld: 86 mg/dL (ref 70–99)
Potassium: 4.7 mEq/L (ref 3.5–5.1)
Sodium: 138 mEq/L (ref 135–145)

## 2013-02-24 MED ORDER — NA SULFATE-K SULFATE-MG SULF 17.5-3.13-1.6 GM/177ML PO SOLN: ORAL | Status: DC

## 2013-02-24 NOTE — Progress Notes (Signed)
Subjective:    Patient ID: Linda Berg, female    DOB: July 07, 1946, 67 y.o.   MRN: [id number]  HPI  Linda Berg is a 67 year old white female known to Dr. Lina Sar  with history of chronic GERD, rheumatoid arthritis, and chronic constipation. She had colonoscopy done in September of 2008 which was a normal exam She has had several upper endoscopies because of finding of Barrett's esophagus remotely in 1999, however subsequent endoscopies have not shown any evidence of Barrett's including the last exam in 2008. Patient comes in today as an urgent add-on with concerns about a bowel blockage . Says she went out of town a week or so ago and became constipated and then last week and developed intense crampy pain across her lower abdomen associated with nausea and vomiting after she had returned home from vacation. Since that time despite taking MiraLax on a daily basis and using some rectal suppositories she has been unable to have a bowel movement. She says she has passed a tiny bit of stool and a lot of mucus. She complains of lower abdominal crampy type pain. Her appetite has been decreased and she says she's been eating very little but has not had any further nausea and vomiting. She feels that she has had some chills but no fever. He has no urinary symptoms. She has had a bilateral tubal ligation and an oophorectomy.    Review of Systems  Constitutional: Positive for appetite change.  HENT: Negative.   Eyes: Negative.   Respiratory: Negative.   Gastrointestinal: Positive for nausea, abdominal pain and constipation.  Endocrine: Negative.   Genitourinary: Negative.   Musculoskeletal: Negative.   Skin: Negative.   Allergic/Immunologic: Negative.   Neurological: Negative.   Hematological: Negative.    Outpatient Encounter Prescriptions as of 02/24/2013  Medication Sig Dispense Refill  . AMBULATORY NON FORMULARY MEDICATION ALLERGY SHOTS Once a week      . aspirin 81 MG tablet Take 81 mg by  mouth 2 (two) times daily.      Marland Kitchen azelastine (ASTELIN) 137 MCG/SPRAY nasal spray Place 1 spray into the nose 2 (two) times daily. Use in each nostril as directed      . B Complex-C (SUPER B COMPLEX PO) Take 1 tablet by mouth daily.      . Biotin 5000 MCG TABS Take 1 tablet by mouth 2 (two) times daily.      . Calcium Carb-Cholecalciferol (CALCIUM-VITAMIN D) 600-400 MG-UNIT TABS Take 1 tablet by mouth 2 (two) times daily.      . cetirizine (ZYRTEC) 10 MG tablet Take 10 mg by mouth at bedtime.      . Coenzyme Q10 (CO Q 10) 100 MG CAPS Take 1 capsule by mouth 2 (two) times daily.      . cycloSPORINE (RESTASIS) 0.05 % ophthalmic emulsion Place 1 drop into both eyes 2 (two) times daily.      . hydroxychloroquine (PLAQUENIL) 200 MG tablet Take 200 mg by mouth 2 (two) times daily.      Boris Lown Oil 300 MG CAPS Take 1 capsule by mouth daily.      Marland Kitchen levothyroxine (SYNTHROID, LEVOTHROID) 75 MCG tablet Take 75 mcg by mouth daily.      Marland Kitchen loratadine (CLARITIN) 10 MG tablet Take 10 mg by mouth every morning.      . lovastatin (MEVACOR) 20 MG tablet Take 20 mg by mouth daily.      . Magnesium 250 MG TABS Take 1 tablet by mouth 2 (  two) times daily.      Marland Kitchen omeprazole (PRILOSEC) 20 MG capsule Take 1 capsule (20 mg total) by mouth daily. Take 1 capsule by mouth once daily.  90 capsule  2  . polyethylene glycol powder (GLYCOLAX/MIRALAX) powder Take 17 g by mouth daily.  1581 g  1  . Probiotic Product (PROBIOTIC DAILY PO) Take 1 tablet by mouth 2 (two) times daily.      . traMADol (ULTRAM) 50 MG tablet Take 50 mg by mouth as needed for pain.      . vitamin C (ASCORBIC ACID) 500 MG tablet Take 500 mg by mouth 2 (two) times daily.      . [DISCONTINUED] meloxicam (MOBIC) 15 MG tablet Take 15 mg by mouth daily.       No facility-administered encounter medications on file as of 02/24/2013.   Allergies  Allergen Reactions  . Prednisone        Patient Active Problem List   Diagnosis Date Noted  . DYSPHAGIA  01/09/2009  . HYPOTHYROIDISM 11/04/2007  . DYSLIPIDEMIA 11/04/2007  . GERD 11/04/2007  . 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/04/2007  . DUODENITIS, WITHOUT HEMORRHAGE 11/04/2007  . HIATAL HERNIA 11/04/2007  . DIVERTICULOSIS, COLON 11/04/2007  . CONSTIPATION, CHRONIC 11/04/2007  . RECTAL BLEEDING 11/04/2007   History  Substance Use Topics  . Smoking status: Never Smoker   . Smokeless tobacco: Never Used  . Alcohol Use: No   family history includes Heart disease in her maternal aunt and mother.  There is no history of Colon cancer.  Objective:   Physical Exam  well-developed older white female in no acute distress, pleasant blood pressure 112/70 pulse 96 height 5 foot 1 weight 151. HEENT; nontraumatic normocephalic EOMI PERRLA sclera anicteric, Supple; no JVD, Cardiovascular; regular rate and rhythm with S1-S2 no murmur or gallop, Pulmonary; clear bilaterally, Abdomen; soft bowel sounds are present but hypoactive she has tenderness bilaterally in the lower quadrants nontender no focal tenderness no guarding or rebound no mass or hepatosplenomegaly, Rectal; exam there is some stool at the tip of the examining finger but no impaction stool is Hemoccult negative, Extremities; no clubbing cyanosis or edema skin warm and dry, Psych; mood and affect normal and appropriate        Assessment & Plan:  #56  67 year old female with history of chronic constipation now with exacerbation over the past week and inability to have a bowel movement despite laxative use. She had one episode of nausea and vomiting 5 days ago but none since Lower lobe partial obstruction though feel this is unlikely. I suspect she has a significant obstipation Peary #2 chronic GERD #3 rheumatoid arthritis  Plan; will obtain abdominal films now as well as CBC and BMET If no obstruction on plain films will ask her to follow a full liquid diet and purge her bowel with a movie prepped or suprep-then if symptoms improve does advance diet as  tolerated If lower abdominal pain persists despite bowel prep and good results with prep she will need CT of the abdomen and pelvis   Addendum; CBC and BMET both normal Plain abdominal films-no evidence of obstruction she does have a moderate to marked stool burden consistent with constipation.

## 2013-02-24 NOTE — Telephone Encounter (Signed)
Left message on her cell # [phone] that blood work from today normal.

## 2013-02-24 NOTE — Progress Notes (Signed)
Reviewed. Consider Sitz mark study for colonic transit time

## 2013-02-24 NOTE — Patient Instructions (Addendum)
Your physician has requested that you go to the basement for the following lab work before leaving today: CBC/diff, BMET  Please  go down to the basement for abdominal films and come back up for results.  Today we are providing you with a suprep today to use for a bowel purge.  Follow the directions on the box.  Use a full liquid diet until after your bowel purge.  I appreciate the opportunity to care for you.

## 2013-02-24 NOTE — Telephone Encounter (Signed)
Patient was out of town last week. States she started having abdominal pain and vomiting on Sunday and Monday. She then felt a little better. Pain came back the next day. She has used stool softener and Miralax with only small amount of stool and mucous being passed. Scheduled with Mike Gip, PA at 3:30 PM.

## 2013-02-27 ENCOUNTER — Telehealth: Payer: Self-pay | Admitting: Internal Medicine

## 2013-02-27 ENCOUNTER — Other Ambulatory Visit: Payer: Self-pay | Admitting: Obstetrics & Gynecology

## 2013-02-27 DIAGNOSIS — R921 Mammographic calcification found on diagnostic imaging of breast: Secondary | ICD-10-CM

## 2013-02-27 NOTE — Telephone Encounter (Signed)
Spoke with patient and the Suprep helped her. She is feeling better.

## 2013-02-27 NOTE — Telephone Encounter (Signed)
Good.

## 2013-03-20 ENCOUNTER — Ambulatory Visit
Admission: RE | Admit: 2013-03-20 | Discharge: 2013-03-20 | Disposition: A | Discharge status: Home / Self Care | Payer: Medicare Other | Source: Ambulatory Visit | Attending: Obstetrics & Gynecology | Admitting: Obstetrics & Gynecology

## 2013-03-20 DIAGNOSIS — R921 Mammographic calcification found on diagnostic imaging of breast: Secondary | ICD-10-CM

## 2013-06-21 ENCOUNTER — Other Ambulatory Visit: Payer: Self-pay | Admitting: Internal Medicine

## 2013-09-11 ENCOUNTER — Telehealth: Payer: Self-pay | Admitting: *Deleted

## 2013-09-11 DIAGNOSIS — K824 Cholesterolosis of gallbladder: Secondary | ICD-10-CM

## 2013-09-11 NOTE — Telephone Encounter (Signed)
Scheduled at [facility] radiology with Alisha on 09/19/13 at 9:30 AM. NPO after midnight. Left a message for patient to call me.

## 2013-09-11 NOTE — Telephone Encounter (Signed)
Message copied by Daphine Deutscher on Mon Sep 11, 2013  1:59 PM ------      Message from: Daphine Deutscher      Created: Tue Sep 13, 2012  4:39 PM       Scheduled Ultrasound abdomen complete 1 year f/u gallbladder polyps.DB ------

## 2013-09-11 NOTE — Telephone Encounter (Signed)
Patient given appointment date and time. She will reschedule the appointment due to a conflict. Patient given radiology number.

## 2013-09-14 ENCOUNTER — Ambulatory Visit (HOSPITAL_COMMUNITY)
Admission: RE | Admit: 2013-09-14 | Discharge: 2013-09-14 | Disposition: A | Discharge status: Home / Self Care | Payer: Medicare Other | Source: Ambulatory Visit | Attending: Internal Medicine | Admitting: Internal Medicine

## 2013-09-14 DIAGNOSIS — K824 Cholesterolosis of gallbladder: Secondary | ICD-10-CM | POA: Insufficient documentation

## 2013-09-18 ENCOUNTER — Other Ambulatory Visit: Payer: Self-pay | Admitting: Obstetrics & Gynecology

## 2013-09-18 DIAGNOSIS — R921 Mammographic calcification found on diagnostic imaging of breast: Secondary | ICD-10-CM

## 2013-09-19 ENCOUNTER — Ambulatory Visit (HOSPITAL_COMMUNITY): Payer: Medicare Other

## 2013-10-02 ENCOUNTER — Ambulatory Visit
Admission: RE | Admit: 2013-10-02 | Discharge: 2013-10-02 | Disposition: A | Discharge status: Home / Self Care | Payer: Medicare Other | Source: Ambulatory Visit | Attending: Obstetrics & Gynecology | Admitting: Obstetrics & Gynecology

## 2013-10-02 DIAGNOSIS — R921 Mammographic calcification found on diagnostic imaging of breast: Secondary | ICD-10-CM

## 2013-12-07 ENCOUNTER — Other Ambulatory Visit: Payer: Self-pay | Admitting: Internal Medicine

## 2014-01-01 ENCOUNTER — Encounter: Payer: Self-pay | Admitting: Internal Medicine

## 2014-03-29 ENCOUNTER — Encounter: Payer: Self-pay | Admitting: Cardiology

## 2014-04-23 ENCOUNTER — Ambulatory Visit
Admission: RE | Admit: 2014-04-23 | Discharge: 2014-04-23 | Disposition: A | Discharge status: Home / Self Care | Payer: Medicare Other | Source: Ambulatory Visit | Attending: Cardiology | Admitting: Cardiology

## 2014-04-23 ENCOUNTER — Ambulatory Visit (INDEPENDENT_AMBULATORY_CARE_PROVIDER_SITE_OTHER): Payer: Medicare Other | Admitting: Cardiology

## 2014-04-23 ENCOUNTER — Encounter: Payer: Self-pay | Admitting: Cardiology

## 2014-04-23 VITALS — BP 110/64 | HR 70 | Ht 61.0 in | Wt 147.0 lb

## 2014-04-23 DIAGNOSIS — R079 Chest pain, unspecified: Secondary | ICD-10-CM

## 2014-04-23 DIAGNOSIS — E039 Hypothyroidism, unspecified: Secondary | ICD-10-CM

## 2014-04-23 DIAGNOSIS — M069 Rheumatoid arthritis, unspecified: Secondary | ICD-10-CM

## 2014-04-23 NOTE — Patient Instructions (Signed)
Your physician recommends that you continue on your current medications as directed. Please refer to the Current Medication list given to you today.  Your physician has requested that you have a lexiscan myoview. For further information please visit [email or web address] Please follow instruction sheet, as given.  A chest x-ray takes a picture of the organs and structures inside the chest, including the heart, lungs, and blood vessels. This test can show several things, including, whether the heart is enlarges; whether fluid is building up in the lungs; and whether pacemaker / defibrillator leads are still in place. [facility]  Follow up as needed

## 2014-04-23 NOTE — Progress Notes (Signed)
Linda Berg Date of Birth:  1946-02-16 [facility] [address] [place], [place]  [address] [phone]        Fax   [phone]   History of Present Illness: This pleasant 68 year old woman is seen by me for the first time today at the request of Dr. Benedetto Goad for evaluation of chest pain.  The patient has been having unusual chest pain.  He wakes arrived between 3 AM and 5 AM in the morning.  The episodes last about 30 minutes.  They seemed to start in the interscapular area and radiated around the right side of her chest into her substernal area.  She describes it as being very intense and frightening for her.  She takes extra aspirin and prays.  She does not give any history of exertional chest discomfort.  She is unable to exercise vigorously because of her rheumatoid arthritis however.  Her chest symptoms are occasionally radiating down into the upper left arm and up into the jaw. Risk factors reveal that there is a strong family history of coronary disease on her mothers side.  Her mother herself died of rheumatic heart disease at age 20.  Her maternal grandfather died of a heart attack and 2 of her maternal aunts died of heart attacks.  Her father died of a stroke at age 61 The patient is not diabetic.  She has never smoked.  She is being treated for hypercholesterolemia with lovastatin. The patient has a history of palpitations which have improved since her thyroid replacement medication was recently adjusted.  Dr. Margaretmary Bayley is her endocrinologist.  Current Outpatient Prescriptions  Medication Sig Dispense Refill  . AMBULATORY NON FORMULARY MEDICATION ALLERGY SHOTS Once a week      . aspirin 81 MG tablet Take 81 mg by mouth 2 (two) times daily.      Marland Kitchen azelastine (ASTELIN) 137 MCG/SPRAY nasal spray Place 1 spray into the nose 2 (two) times daily. Use in each nostril as directed      . B Complex-C (SUPER B COMPLEX PO) Take 1 tablet by mouth daily.       . Biotin 5000 MCG TABS Take 1 tablet by mouth 2 (two) times daily.      . Calcium Carb-Cholecalciferol (CALCIUM-VITAMIN D) 600-400 MG-UNIT TABS Take 1 tablet by mouth 2 (two) times daily.      . cetirizine (ZYRTEC) 10 MG tablet Take 10 mg by mouth at bedtime.      . Cholecalciferol (D3 ADULT) 1000 UNITS CHEW Chew by mouth.      . Coenzyme Q10 (CO Q 10) 100 MG CAPS Take 1 capsule by mouth 2 (two) times daily.      . cycloSPORINE (RESTASIS) 0.05 % ophthalmic emulsion Place 1 drop into both eyes 2 (two) times daily.      . hydroxychloroquine (PLAQUENIL) 200 MG tablet Take 200 mg by mouth 2 (two) times daily.      Boris Lown Oil 300 MG CAPS Take 1 capsule by mouth daily.      Marland Kitchen lovastatin (MEVACOR) 20 MG tablet Take 20 mg by mouth daily.      . Magnesium 250 MG TABS Take 1 tablet by mouth 2 (two) times daily.      Marland Kitchen oxybutynin (DITROPAN) 5 MG tablet Take 5 mg by mouth every 8 (eight) hours as needed for bladder spasms.      . pantoprazole (PROTONIX) 40 MG tablet Take 40 mg by mouth daily.      Marland Kitchen  Probiotic Product (PROBIOTIC DAILY PO) Take 1 tablet by mouth 2 (two) times daily.      Marland Kitchen thyroid (ARMOUR) 60 MG tablet Take 60 mg by mouth daily before breakfast.      . vitamin C (ASCORBIC ACID) 500 MG tablet Take 500 mg by mouth 2 (two) times daily.      . Zinc 50 MG CAPS Take by mouth.       No current facility-administered medications for this visit.    Allergies  Allergen Reactions  . Prednisone Other (See Comments)    Stomach spasms  . Terbinafine Hcl Rash    Patient Active Problem List   Diagnosis Date Noted  . DYSPHAGIA 01/09/2009  . HYPOTHYROIDISM 11/04/2007  . DYSLIPIDEMIA 11/04/2007  . GERD 11/04/2007  . 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/04/2007  . DUODENITIS, WITHOUT HEMORRHAGE 11/04/2007  . HIATAL HERNIA 11/04/2007  . DIVERTICULOSIS, COLON 11/04/2007  . CONSTIPATION, CHRONIC 11/04/2007  . RECTAL BLEEDING 11/04/2007    History  Smoking status  . Never Smoker   Smokeless tobacco  .  Never Used    History  Alcohol Use No    Family History  Problem Relation Age of Onset  . Colon cancer Neg Hx   . Heart disease Mother   . Heart disease Maternal Aunt     aunt x 2    Review of Systems: Constitutional: no fever chills diaphoresis or fatigue or change in weight.  Head and neck: no hearing loss, no epistaxis, no photophobia or visual disturbance. Respiratory: No cough, shortness of breath or wheezing. Cardiovascular: No chest pain peripheral edema, palpitations. Gastrointestinal: No abdominal distention, no abdominal pain, no change in bowel habits hematochezia or melena. Genitourinary: No dysuria, no frequency, no urgency, no nocturia. Musculoskeletal:No arthralgias, no back pain, no gait disturbance or myalgias. Neurological: No dizziness, no headaches, no numbness, no seizures, no syncope, no weakness, no tremors. Hematologic: No lymphadenopathy, no easy bruising. Psychiatric: No confusion, no hallucinations, no sleep disturbance.    Physical Exam: Filed Vitals:   04/23/14 0943  BP: 110/64  Pulse: 70  The patient appears to be in no distress.  Head and neck exam reveals that the pupils are equal and reactive.  The extraocular movements are full.  There is no scleral icterus.  Mouth and pharynx are benign.  No lymphadenopathy.  No carotid bruits.  The jugular venous pressure is normal.  Thyroid is not enlarged or tender.  Chest is clear to percussion and auscultation.  No rales or rhonchi.  Expansion of the chest is symmetrical.  Heart reveals no abnormal lift or heave.  First and second heart sounds are normal.  There is no murmur gallop rub or click.  The abdomen is soft and nontender.  Bowel sounds are normoactive.  There is no hepatosplenomegaly or mass.  There are no abdominal bruits.  Extremities reveal no phlebitis or edema.  Pedal pulses are good.  There is no cyanosis or clubbing.  Neurologic exam is normal strength and no lateralizing weakness.   No sensory deficits.  Integument reveals no rash  EKG shows normal sinus rhythm and is within normal limits.  Assessment / Plan: 1. atypical chest pain 2. rheumatoid arthritis on Plaquenil 3. hypercholesterolemia on lovastatin 4. Palpitations 5. hypothyroidism on thyroid replacement  Plan: We will get a chest x-ray.  We will have her return for a Lexiscan Myoview stress test.  Many thanks for the opportunity to see this pleasant woman with you.  We will be in touch regarding the  results of her studies.

## 2014-04-27 ENCOUNTER — Encounter: Payer: Self-pay | Admitting: Cardiology

## 2014-05-03 ENCOUNTER — Ambulatory Visit (HOSPITAL_COMMUNITY): Payer: Medicare Other | Attending: Internal Medicine | Admitting: Radiology

## 2014-05-03 VITALS — BP 99/75 | Ht 61.0 in | Wt 144.0 lb

## 2014-05-03 DIAGNOSIS — R002 Palpitations: Secondary | ICD-10-CM | POA: Insufficient documentation

## 2014-05-03 DIAGNOSIS — R079 Chest pain, unspecified: Secondary | ICD-10-CM | POA: Diagnosis present

## 2014-05-03 DIAGNOSIS — E785 Hyperlipidemia, unspecified: Secondary | ICD-10-CM | POA: Insufficient documentation

## 2014-05-03 MED ORDER — TECHNETIUM TC 99M SESTAMIBI GENERIC - CARDIOLITE
33.0000 | Freq: Once | INTRAVENOUS | Status: AC | PRN
  Administered 2014-05-03: 33 via INTRAVENOUS

## 2014-05-03 MED ORDER — TECHNETIUM TC 99M SESTAMIBI GENERIC - CARDIOLITE
11.0000 | Freq: Once | INTRAVENOUS | Status: AC | PRN
  Administered 2014-05-03: 11 via INTRAVENOUS

## 2014-05-03 MED ORDER — REGADENOSON 0.4 MG/5ML IV SOLN
0.4000 mg | Freq: Once | INTRAVENOUS | Status: AC
  Administered 2014-05-03: 0.4 mg via INTRAVENOUS

## 2014-05-03 NOTE — Progress Notes (Signed)
[facility] SITE 3 NUCLEAR MED [address] [place], [place] [address] [phone]    Cardiology Nuclear Med Study  Linda Berg is a 68 y.o. female     MRN : [id number]     DOB: 09-14-1945  Procedure Date: 05/03/2014  Nuclear Med Background Indication for Stress Test:  Evaluation for Ischemia History:  12/22/00 MPI: EF: 79%  Cardiac Risk Factors: Lipids  Symptoms:  Chest Pain and Palpitations   Nuclear Pre-Procedure Caffeine/Decaff Intake:  None> 12 hrs NPO After: 9:00pm   Lungs:  clear O2 Sat: 99% on room air. IV 0.9% NS with Angio Cath:  24g  IV Site: L Forearm, tolerated well  IV Started by:  Irean Hong, RN  Chest Size (in):  38 Cup Size: B  Height: 5\' 1"  (1.549 m)  Weight:  144 lb (65.318 kg)  BMI:  Body mass index is 27.22 kg/(m^2). Tech Comments:  N/A    Nuclear Med Study 1 or 2 day study: 1 day  Stress Test Type:  Lexiscan  Reading MD: N/A  Order Authorizing Provider:  , MD  Resting Radionuclide: Technetium 10m Sestamibi  Resting Radionuclide Dose: 11.0 mCi   Stress Radionuclide:  Technetium 94m Sestamibi  Stress Radionuclide Dose: 33.0 mCi           Stress Protocol Rest HR: 70 Stress HR: 101  Rest BP: 99/75 Stress BP: 89/57  Exercise Time (min): n/a METS: n/a   Predicted Max HR: 152 bpm % Max HR: 66.45 bpm Rate Pressure Product: 9999   Dose of Adenosine (mg):  n/a Dose of Lexiscan: 0.4 mg  Dose of Atropine (mg): n/a Dose of Dobutamine: n/a mcg/kg/min (at max HR)  Stress Test Technologist: 84m, EMT-P  Nuclear Technologist:  Milana Na, CNMT     Rest Procedure:  Myocardial perfusion imaging was performed at rest 45 minutes following the intravenous administration of Technetium 69m Sestamibi. Rest ECG: NSR - Normal EKG  Stress Procedure:  The patient received IV Lexiscan 0.4 mg over 15-seconds.  Technetium 27m Sestamibi injected at 30-seconds. This patient was sob with the Lexiscan injection.  Quantitative spect images were obtained after a 45 minute delay. Stress ECG: No significant change from baseline ECG  QPS Raw Data Images:  Mild breast attenuation.  Normal left ventricular size. Stress Images:  Normal homogeneous uptake in all areas of the myocardium. Rest Images:  Normal homogeneous uptake in all areas of the myocardium. Subtraction (SDS):  No evidence of ischemia. Transient Ischemic Dilatation (Normal <1.22):  0.98 Lung/Heart Ratio (Normal <0.45):  0.32  Quantitative Gated Spect Images QGS EDV:  64 ml QGS ESV:  22 ml  Impression Exercise Capacity:  Lexiscan with no exercise. BP Response:  Resting hypotension with BP 87/56mmHg with no change during Lexiscan infusion Clinical Symptoms:  There is dyspnea. ECG Impression:  No significant ST segment change suggestive of ischemia. Comparison with Prior Nuclear Study: No images to compare  Overall Impression:  Low risk stress nuclear study No obvious areas of ischemia but this is a poor quality study with diffusely reduced radiotracer uptake and siginificant extra cardiac uptake.    LV Ejection Fraction: 66%.  LV Wall Motion:  NL LV Function; NL Wall Motion  Signed: 59m, MD [facility]

## 2014-05-07 ENCOUNTER — Telehealth: Payer: Self-pay | Admitting: *Deleted

## 2014-05-07 NOTE — Telephone Encounter (Signed)
Advised patient of results.  

## 2014-05-07 NOTE — Telephone Encounter (Signed)
Message copied by Burnell Blanks on Mon May 07, 2014 12:52 PM ------      Message from: Cassell Clement      Created: Fri May 04, 2014  8:30 PM       Please report.  Stress test normal.  Send report to Dr. Benedetto Goad ------

## 2014-09-05 ENCOUNTER — Ambulatory Visit (INDEPENDENT_AMBULATORY_CARE_PROVIDER_SITE_OTHER): Payer: Medicare Other | Admitting: Neurology

## 2014-09-05 ENCOUNTER — Encounter: Payer: Self-pay | Admitting: Neurology

## 2014-09-05 VITALS — BP 118/80 | HR 104 | Ht 61.0 in | Wt 146.0 lb

## 2014-09-05 DIAGNOSIS — K209 Esophagitis, unspecified without bleeding: Secondary | ICD-10-CM | POA: Insufficient documentation

## 2014-09-05 DIAGNOSIS — M1990 Unspecified osteoarthritis, unspecified site: Secondary | ICD-10-CM | POA: Insufficient documentation

## 2014-09-05 DIAGNOSIS — N3000 Acute cystitis without hematuria: Secondary | ICD-10-CM | POA: Insufficient documentation

## 2014-09-05 DIAGNOSIS — R202 Paresthesia of skin: Secondary | ICD-10-CM

## 2014-09-05 DIAGNOSIS — R05 Cough: Secondary | ICD-10-CM | POA: Insufficient documentation

## 2014-09-05 DIAGNOSIS — R002 Palpitations: Secondary | ICD-10-CM | POA: Insufficient documentation

## 2014-09-05 DIAGNOSIS — R200 Anesthesia of skin: Secondary | ICD-10-CM | POA: Insufficient documentation

## 2014-09-05 DIAGNOSIS — Z0000 Encounter for general adult medical examination without abnormal findings: Secondary | ICD-10-CM | POA: Insufficient documentation

## 2014-09-05 DIAGNOSIS — R3915 Urgency of urination: Secondary | ICD-10-CM | POA: Insufficient documentation

## 2014-09-05 DIAGNOSIS — R32 Unspecified urinary incontinence: Secondary | ICD-10-CM | POA: Insufficient documentation

## 2014-09-05 DIAGNOSIS — H9190 Unspecified hearing loss, unspecified ear: Secondary | ICD-10-CM | POA: Insufficient documentation

## 2014-09-05 DIAGNOSIS — R059 Cough, unspecified: Secondary | ICD-10-CM | POA: Insufficient documentation

## 2014-09-05 DIAGNOSIS — E785 Hyperlipidemia, unspecified: Secondary | ICD-10-CM | POA: Insufficient documentation

## 2014-09-05 DIAGNOSIS — M791 Myalgia, unspecified site: Secondary | ICD-10-CM | POA: Insufficient documentation

## 2014-09-05 DIAGNOSIS — Z87898 Personal history of other specified conditions: Secondary | ICD-10-CM | POA: Insufficient documentation

## 2014-09-05 DIAGNOSIS — L719 Rosacea, unspecified: Secondary | ICD-10-CM | POA: Insufficient documentation

## 2014-09-05 DIAGNOSIS — R399 Unspecified symptoms and signs involving the genitourinary system: Secondary | ICD-10-CM | POA: Insufficient documentation

## 2014-09-05 DIAGNOSIS — R109 Unspecified abdominal pain: Secondary | ICD-10-CM | POA: Insufficient documentation

## 2014-09-05 NOTE — Progress Notes (Signed)
note

## 2014-09-05 NOTE — Progress Notes (Signed)
PATIENT: Linda Berg DOB: 10-Aug-1946  HISTORICAL  Linda Berg is 69 yo LH FEMALE, REFERRED BY PA Mady Gemma for evaluation of acute onset left facial numbness  She has past medical history of hyperlipidemia, rheumatoid arthritis, taking Plaquenil,  December 20 second 2015, she woke up and noticed left facial numbness, left arm hand paresthesia, she denies dysarthria, no weakness, symptoms has gradually improved over the past few weeks,  She already taking aspirin 81 mg twice a day    REVIEW OF SYSTEMS: Full 14 system review of systems performed and notable only for as above  ALLERGIES: Allergies  Allergen Reactions  . Prednisone Other (See Comments)    Stomach spasms  . Terbinafine Hcl Rash    HOME MEDICATIONS: Current Outpatient Prescriptions  Medication Sig Dispense Refill  . AMBULATORY NON FORMULARY MEDICATION ALLERGY SHOTS Once a week    . aspirin 81 MG tablet Take 81 mg by mouth 2 (two) times daily.    Marland Kitchen azelastine (ASTELIN) 137 MCG/SPRAY nasal spray Place 1 spray into the nose 2 (two) times daily. Use in each nostril as directed    . B Complex-C (SUPER B COMPLEX PO) Take 1 tablet by mouth daily.    . Biotin 5000 MCG TABS Take 1 tablet by mouth 2 (two) times daily.    . Calcium Carb-Cholecalciferol (CALCIUM-VITAMIN D) 600-400 MG-UNIT TABS Take 1 tablet by mouth 2 (two) times daily.    . cetirizine (ZYRTEC) 10 MG tablet Take 10 mg by mouth at bedtime.    . Cholecalciferol (D3 ADULT) 1000 UNITS CHEW Chew by mouth.    . Coenzyme Q10 (CO Q 10) 100 MG CAPS Take 1 capsule by mouth 2 (two) times daily.    . cycloSPORINE (RESTASIS) 0.05 % ophthalmic emulsion Place 1 drop into both eyes 2 (two) times daily.    . hydroxychloroquine (PLAQUENIL) 200 MG tablet Take 200 mg by mouth 2 (two) times daily.    Boris Lown Oil 300 MG CAPS Take 1 capsule by mouth daily.    Marland Kitchen lovastatin (MEVACOR) 20 MG tablet Take 20 mg by mouth daily.    . Magnesium 250 MG TABS Take 1 tablet  by mouth 2 (two) times daily.    Marland Kitchen oxybutynin (DITROPAN) 5 MG tablet Take 5 mg by mouth every 8 (eight) hours as needed for bladder spasms.    . pantoprazole (PROTONIX) 40 MG tablet Take 40 mg by mouth daily.    . Probiotic Product (PROBIOTIC DAILY PO) Take 1 tablet by mouth 2 (two) times daily.    Marland Kitchen thyroid (ARMOUR) 60 MG tablet Take 60 mg by mouth daily before breakfast.    . vitamin C (ASCORBIC ACID) 500 MG tablet Take 500 mg by mouth 2 (two) times daily.    . Zinc 50 MG CAPS Take by mouth.     No current facility-administered medications for this visit.    PAST MEDICAL HISTORY: Past Medical History  Diagnosis Date  . GERD (gastroesophageal reflux disease)   . Barrett esophagus   . Hiatal hernia   . Diverticulosis   . Dyslipidemia   . Hypothyroidism   . Hearing loss   . Rheumatoid artheritis.   . Facial numbness     PAST SURGICAL HISTORY: Past Surgical History  Procedure Laterality Date  . Mandible surgery  9/93    pallete expansion  . Tonsillectomy    . Tubal ligation    . Intrauterine device insertion    . Iud removal    .  Left oophorectomy  03/13/98  . Knee arthroscopy  12/25/11    left    FAMILY HISTORY: Family History  Problem Relation Age of Onset  . Colon cancer Neg Hx   . Heart disease Mother   . Heart disease Maternal Aunt     aunt x 2  . Stroke Father     SOCIAL HISTORY:  History   Social History  . Marital Status: Married    Spouse Name: N/A    Number of Children: 2  . Years of Education: 12   Occupational History  . retired    Social History Main Topics  . Smoking status: Never Smoker   . Smokeless tobacco: Never Used  . Alcohol Use: No  . Drug Use: No  . Sexual Activity: Not on file   Other Topics Concern  . Not on file   Social History Narrative   Live with husband at home.   Left-handed.   1 cup coffee/day     PHYSICAL EXAM   Filed Vitals:   09/05/14 0857  BP: 118/80  Pulse: 104  Height: 5\' 1"  (1.549 m)  Weight: 146  lb (66.225 kg)    Not recorded      Body mass index is 27.6 kg/(m^2).   Generalized: In no acute distress  Neck: Supple, no carotid bruits   Cardiac: Regular rate rhythm  Pulmonary: Clear to auscultation bilaterally  Musculoskeletal: No deformity  Neurological examination  Mentation: Alert oriented to time, place, history taking, and causual conversation  Cranial nerve II-XII: Pupils were equal round reactive to light. Extraocular movements were full.  Visual field were full on confrontational test. Bilateral fundi were sharp.  Facial sensation and strength were normal. Hearing was intact to finger rubbing bilaterally. Uvula tongue midline.  Head turning and shoulder shrug and were normal and symmetric.Tongue protrusion into cheek strength was normal.  Motor: Normal tone, bulk and strength.  Sensory: Intact to fine touch, pinprick, preserved vibratory sensation, and proprioception at toes.  Coordination: Normal finger to nose, heel-to-shin bilaterally there was no truncal ataxia  Gait: Rising up from seated position without assistance, normal stance, without trunk ataxia, moderate stride, good arm swing, smooth turning, able to perform tiptoe, and heel walking without difficulty.   Romberg signs: Negative  Deep tendon reflexes: Brachioradialis 2/2, biceps 2/2, triceps 2/2, patellar 2/2, Achilles 2/2, plantar responses were flexor bilaterally.   DIAGNOSTIC DATA (LABS, IMAGING, TESTING) - I reviewed patient records, labs, notes, testing and imaging myself where available.  Lab Results  Component Value Date   WBC 4.9 02/24/2013   HGB 14.0 02/24/2013   HCT 41.5 02/24/2013   MCV 97.2 02/24/2013   PLT 295.0 02/24/2013      Component Value Date/Time   NA 138 02/24/2013 1632   K 4.7 02/24/2013 1632   CL 102 02/24/2013 1632   CO2 30 02/24/2013 1632   GLUCOSE 86 02/24/2013 1632   BUN 15 02/24/2013 1632   CREATININE 1.0 02/24/2013 1632   CALCIUM 9.5 02/24/2013 1632     ASSESSMENT AND PLAN  Linda Berg is a 69 y.o. 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  No orders of the defined types were placed in this encounter.  No Follow-up on file.  Levert Feinstein, M.D. Ph.D.  [facility] [address], [address] [place], [place] [address] [phone]

## 2014-09-17 DIAGNOSIS — H9319 Tinnitus, unspecified ear: Secondary | ICD-10-CM | POA: Insufficient documentation

## 2014-09-18 ENCOUNTER — Ambulatory Visit
Admission: RE | Admit: 2014-09-18 | Discharge: 2014-09-18 | Disposition: A | Discharge status: Home / Self Care | Payer: Medicare Other | Source: Ambulatory Visit | Attending: Neurology | Admitting: Neurology

## 2014-09-18 DIAGNOSIS — E785 Hyperlipidemia, unspecified: Secondary | ICD-10-CM

## 2014-09-18 DIAGNOSIS — R202 Paresthesia of skin: Secondary | ICD-10-CM

## 2014-09-18 MED ORDER — GADOBENATE DIMEGLUMINE 529 MG/ML IV SOLN: 14.0000 mL | Freq: Once | INTRAVENOUS | Status: AC | PRN

## 2014-09-20 ENCOUNTER — Telehealth: Payer: Self-pay | Admitting: Neurology

## 2014-09-20 NOTE — Telephone Encounter (Signed)
Spoke to patient - she verbalized understanding of results.  She will continue aspirin 325mg  and keep her follow up appt in March.

## 2014-09-20 NOTE — Telephone Encounter (Signed)
Linda Berg, please call patient, no significant abnormality on MRI of brain, normal MRA of brain and neck

## 2014-10-11 ENCOUNTER — Ambulatory Visit (INDEPENDENT_AMBULATORY_CARE_PROVIDER_SITE_OTHER): Payer: Medicare Other | Admitting: Neurology

## 2014-10-11 ENCOUNTER — Encounter: Payer: Self-pay | Admitting: Neurology

## 2014-10-11 VITALS — BP 112/64 | HR 68 | Ht 61.0 in | Wt 147.0 lb

## 2014-10-11 DIAGNOSIS — E785 Hyperlipidemia, unspecified: Secondary | ICD-10-CM | POA: Insufficient documentation

## 2014-10-11 DIAGNOSIS — I679 Cerebrovascular disease, unspecified: Secondary | ICD-10-CM | POA: Diagnosis not present

## 2014-10-11 DIAGNOSIS — R202 Paresthesia of skin: Secondary | ICD-10-CM | POA: Insufficient documentation

## 2014-10-11 NOTE — Progress Notes (Signed)
PATIENT: Linda Berg DOB: Nov 22, 1945  HISTORICAL  Linda Berg is 69 yo LH FEMALE, REFERRED BY PA Linda Berg for evaluation of acute onset left facial numbness  She has past medical history of hyperlipidemia, rheumatoid arthritis, taking Plaquenil,  December 22nd 2015, she woke up and noticed left facial numbness, left arm hand paresthesia, she denies dysarthria, no weakness, symptoms has gradually improved over the past few weeks,  She already taking aspirin 81 mg twice a day  UPDATE October 11 2014: She has no left facial numbness, she is a retired Film/video editor, not exercise regularly. I have reviewed MRI brain Feb 9th 2016: There was periventricular small vessel disease, no acute lesions,   MRA of brain and neck was normal Myocardial perfusion study in September 2015, and EKG was normal  REVIEW OF SYSTEMS: Full 14 system review of systems performed and notable only for light sensitivity, hearing loss, cold, heat intolerance, environmental allergy, bladder incontinence constipation, joints pain, back pain, neck pain,  ALLERGIES: Allergies  Allergen Reactions  . Prednisone Other (See Comments)    Stomach spasms  . Terbinafine Hcl Rash    HOME MEDICATIONS: Current Outpatient Prescriptions  Medication Sig Dispense Refill  . AMBULATORY NON FORMULARY MEDICATION ALLERGY SHOTS Once a week    . aspirin 325 MG tablet Take 325 mg by mouth daily.    Marland Kitchen azelastine (ASTELIN) 137 MCG/SPRAY nasal spray Place 1 spray into the nose 2 (two) times daily. Use in each nostril as directed    . B Complex-C (SUPER B COMPLEX PO) Take 1 tablet by mouth daily.    . Biotin 5000 MCG TABS Take 1 tablet by mouth 2 (two) times daily.    . Calcium Carb-Cholecalciferol (CALCIUM-VITAMIN D) 600-400 MG-UNIT TABS Take 1 tablet by mouth 2 (two) times daily.    . cetirizine (ZYRTEC) 10 MG tablet Take 10 mg by mouth at bedtime.    . Cholecalciferol (D3 ADULT) 1000 UNITS CHEW Chew by mouth.    .  Coenzyme Q10 (CO Q 10) 100 MG CAPS Take 1 capsule by mouth 2 (two) times daily.    . cycloSPORINE (RESTASIS) 0.05 % ophthalmic emulsion Place 1 drop into both eyes 2 (two) times daily.    . hydroxychloroquine (PLAQUENIL) 200 MG tablet Take 200 mg by mouth 2 (two) times daily.    Boris Lown Oil 300 MG CAPS Take 1 capsule by mouth daily.    Marland Kitchen lovastatin (MEVACOR) 20 MG tablet Take 20 mg by mouth daily.    . Magnesium 250 MG TABS Take 1 tablet by mouth 2 (two) times daily.    . pantoprazole (PROTONIX) 40 MG tablet Take 40 mg by mouth daily.    . Probiotic Product (PROBIOTIC DAILY PO) Take 1 tablet by mouth 2 (two) times daily.    Marland Kitchen thyroid (ARMOUR) 60 MG tablet Take 60 mg by mouth daily before breakfast.    . vitamin C (ASCORBIC ACID) 500 MG tablet Take 500 mg by mouth 2 (two) times daily.    . Zinc 50 MG CAPS Take by mouth.     No current facility-administered medications for this visit.    PAST MEDICAL HISTORY: Past Medical History  Diagnosis Date  . GERD (gastroesophageal reflux disease)   . Barrett esophagus   . Hiatal hernia   . Diverticulosis   . Dyslipidemia   . Hypothyroidism   . Hearing loss   . Rheumatoid artheritis.   . Facial numbness     PAST  SURGICAL HISTORY: Past Surgical History  Procedure Laterality Date  . Mandible surgery  9/93    pallete expansion  . Tonsillectomy    . Tubal ligation    . Intrauterine device insertion    . Iud removal    . Left oophorectomy  03/13/98  . Knee arthroscopy  12/25/11    left    FAMILY HISTORY: Family History  Problem Relation Age of Onset  . Colon cancer Neg Hx   . Heart disease Mother   . Heart disease Maternal Aunt     aunt x 2  . Stroke Father     SOCIAL HISTORY:  History   Social History  . Marital Status: Married    Spouse Name: N/A  . Number of Children: 2  . Years of Education: 12   Occupational History  . retired    Social History Main Topics  . Smoking status: Never Smoker   . Smokeless tobacco:  Never Used  . Alcohol Use: No  . Drug Use: No  . Sexual Activity: Not on file   Other Topics Concern  . Not on file   Social History Narrative   Live with husband at home.   Left-handed.   1 cup coffee/day     PHYSICAL EXAM   Filed Vitals:   10/11/14 1102  BP: 112/64  Pulse: 68  Height: 5\' 1"  (1.549 m)  Weight: 147 lb (66.679 kg)    Not recorded      Body mass index is 27.79 kg/(m^2).  PHYSICAL EXAMNIATION:  Gen: NAD, conversant, well nourised, obese, well groomed                     Cardiovascular: Regular rate rhythm, no peripheral edema, warm, nontender. Eyes: Conjunctivae clear without exudates or hemorrhage Neck: Supple, no carotid bruise. Pulmonary: Clear to auscultation bilaterally   NEUROLOGICAL EXAM:  MENTAL STATUS: Speech:    Speech is normal; fluent and spontaneous with normal comprehension.  Cognition:    The patient is oriented to person, place, and time;     recent and remote memory intact;     language fluent;     normal attention, concentration,     fund of knowledge.  CRANIAL NERVES: CN II: Visual fields are full to confrontation. Fundoscopic exam is normal with sharp discs and no vascular changes. Venous pulsations are present bilaterally. Pupils are 4 mm and briskly reactive to light. Visual acuity is 20/20 bilaterally. CN III, IV, VI: extraocular movement are normal. No ptosis. CN V: Facial sensation is intact to pinprick in all 3 divisions bilaterally. Corneal responses are intact.  CN VII: Face is symmetric with normal eye closure and smile. CN VIII: Hearing is normal to rubbing fingers CN IX, X: Palate elevates symmetrically. Phonation is normal. CN XI: Head turning and shoulder shrug are intact CN XII: Tongue is midline with normal movements and no atrophy.  MOTOR: There is no pronator drift of out-stretched arms. Muscle bulk and tone are normal. Muscle strength is normal.   Shoulder abduction Shoulder external rotation Elbow  flexion Elbow extension Wrist flexion Wrist extension Finger abduction Hip flexion Knee flexion Knee extension Ankle dorsi flexion Ankle plantar flexion  R 5 5 5 5 5 5 5 5 5 5 5 5   L 5 5 5 5 5 5 5 5 5 5 5 5     REFLEXES: Reflexes are 2+ and symmetric at the biceps, triceps, knees, and ankles. Plantar responses are flexor.  SENSORY: Light  touch, pinprick, position sense, and vibration sense are intact in fingers and toes.  COORDINATION: Rapid alternating movements and fine finger movements are intact. There is no dysmetria on finger-to-nose and heel-knee-shin. There are no abnormal or extraneous movements.   GAIT/STANCE: Posture is normal. Gait is steady with normal steps, base, arm swing, and turning. Heel and toe walking are normal. Tandem gait is normal.  Romberg is absent.    DIAGNOSTIC DATA (LABS, IMAGING, TESTING) - I reviewed patient records, labs, notes, testing and imaging myself where available.  Lab Results  Component Value Date   WBC 4.9 02/24/2013   HGB 14.0 02/24/2013   HCT 41.5 02/24/2013   MCV 97.2 02/24/2013   PLT 295.0 02/24/2013      Component Value Date/Time   NA 138 02/24/2013 1632   K 4.7 02/24/2013 1632   CL 102 02/24/2013 1632   CO2 30 02/24/2013 1632   GLUCOSE 86 02/24/2013 1632   BUN 15 02/24/2013 1632   CREATININE 1.0 02/24/2013 1632   CALCIUM 9.5 02/24/2013 1632    ASSESSMENT AND PLAN  LYNNA ZAMORANO is a 69 y.o. female  with vascular risk factors of aging, hyperlipidemia, presenting with acute onset of left-sided hemiparesthesia, most suggestive of her right hemisphere small vessel disease, We have reviewed MRI I of the brain together today, mild small vessel disease, no acute lesions, MRA of the brain and neck was normal,  She is to continue her aspirin 81 mg daily moderate exercise, keep well hydration, continue work with her primary care to adjust vascular risk factor, only return to clinic for new issues.  Levert Feinstein, M.D.  Ph.D.  [facility] [address], [address] [place], [place] [address] [phone]

## 2014-11-05 ENCOUNTER — Other Ambulatory Visit: Payer: Self-pay

## 2014-11-05 DIAGNOSIS — Z1231 Encounter for screening mammogram for malignant neoplasm of breast: Secondary | ICD-10-CM

## 2014-11-15 ENCOUNTER — Ambulatory Visit
Admission: RE | Admit: 2014-11-15 | Discharge: 2014-11-15 | Disposition: A | Discharge status: Home / Self Care | Payer: Medicare Other | Source: Ambulatory Visit

## 2014-11-15 DIAGNOSIS — Z1231 Encounter for screening mammogram for malignant neoplasm of breast: Secondary | ICD-10-CM

## 2015-02-04 ENCOUNTER — Other Ambulatory Visit: Payer: Self-pay

## 2015-02-04 IMAGING — US US ABDOMEN COMPLETE
1 series · 14 of 25 positions shown · non-contrast
Comparison: none

Abdominal ultrasound
HISTORY: Dyspepsia

[Series 1: us abdomen complete · 0.32mm/px · 14 of 87 slices shown]
[im 1/87]
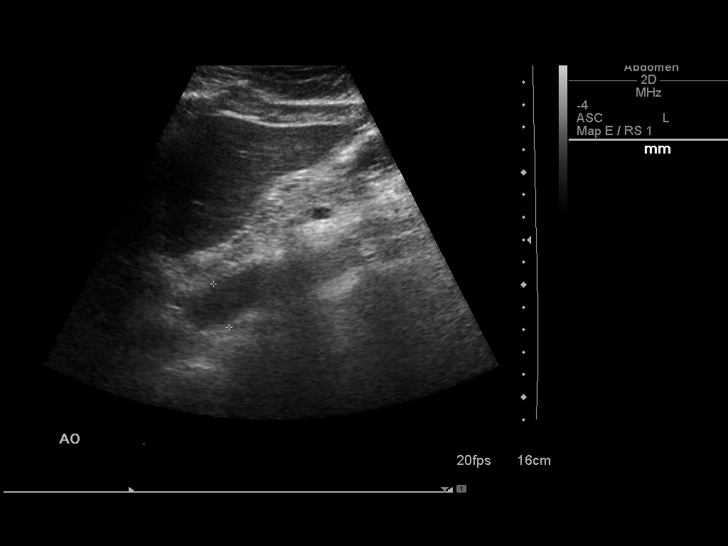
[im 8/87]
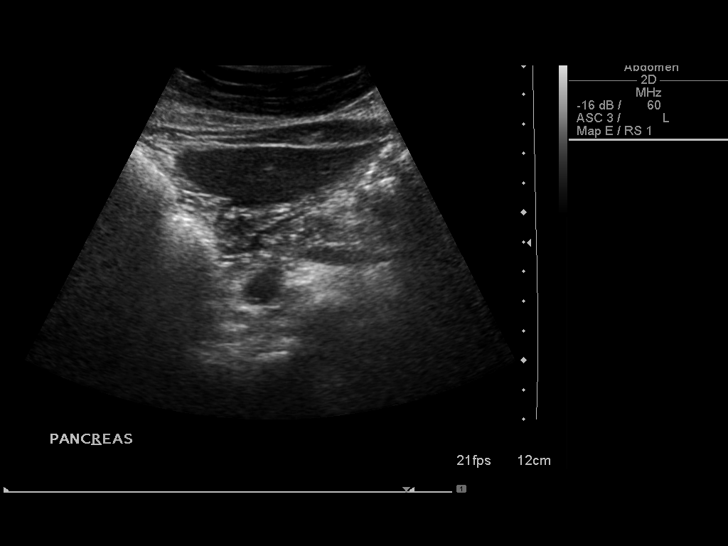
[im 15/87]
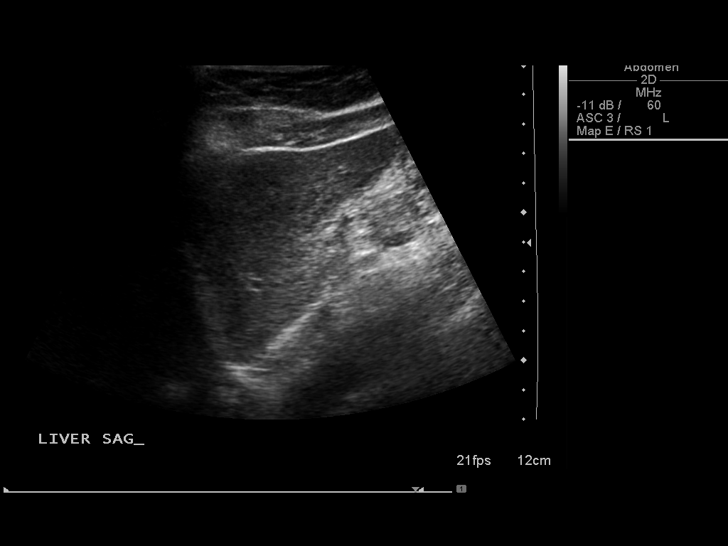
[im 22/87]
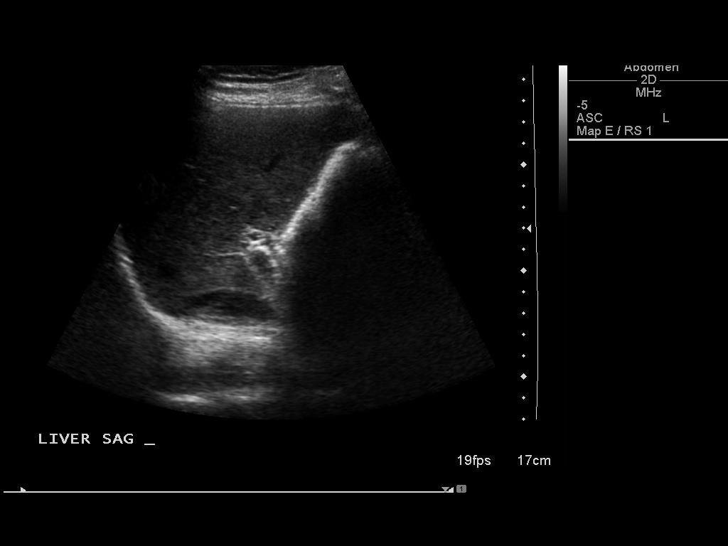
[im 29/87]
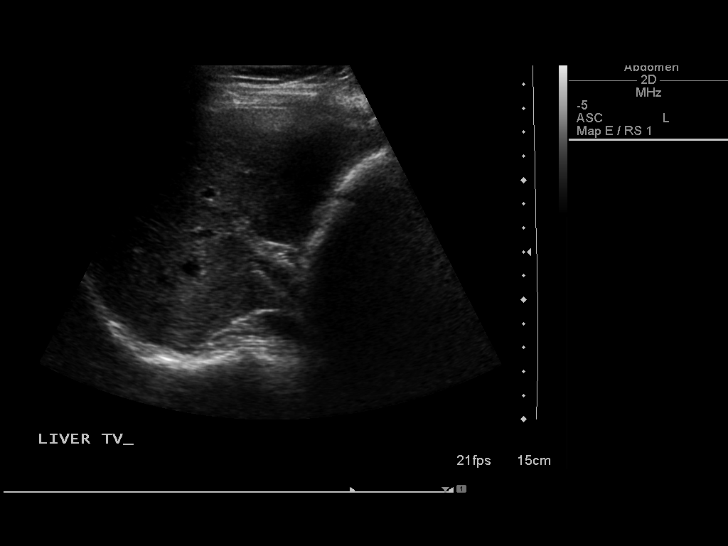
[im 33/87]
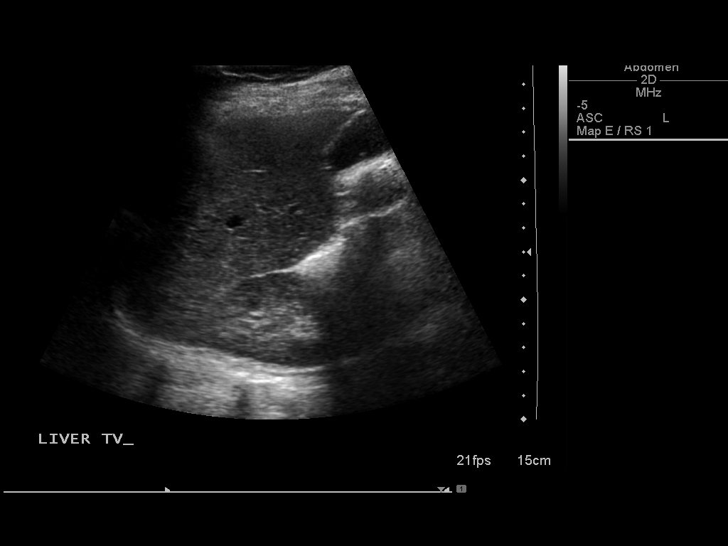
[im 40/87]
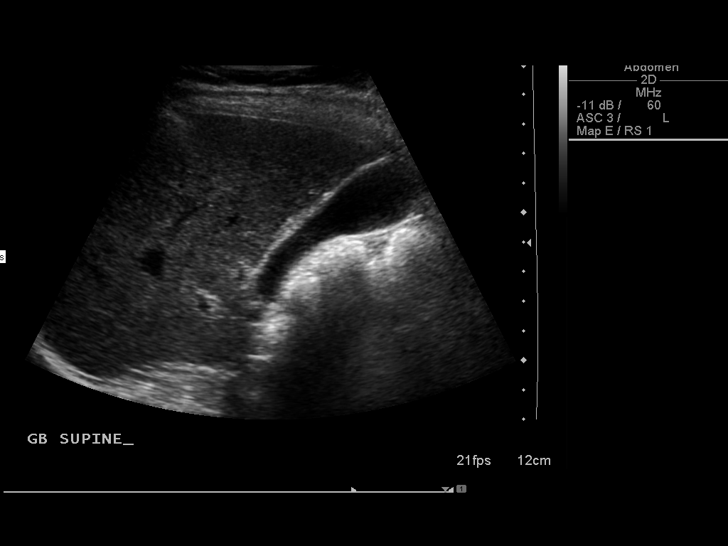
[im 47/87]
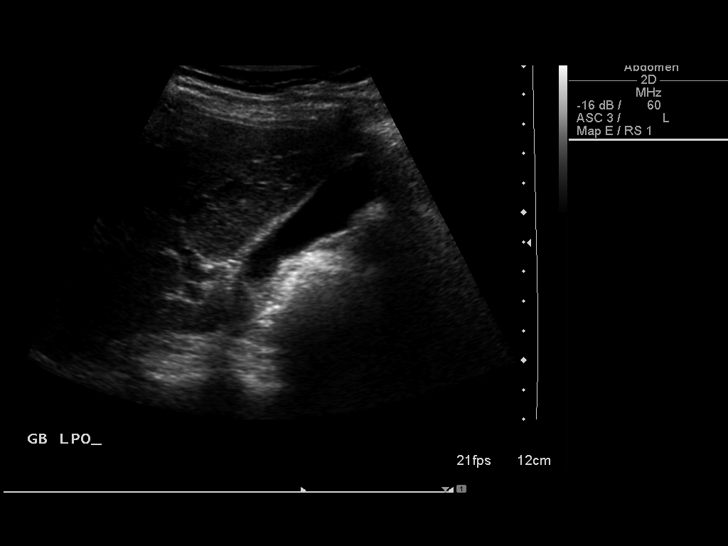
[im 54/87]
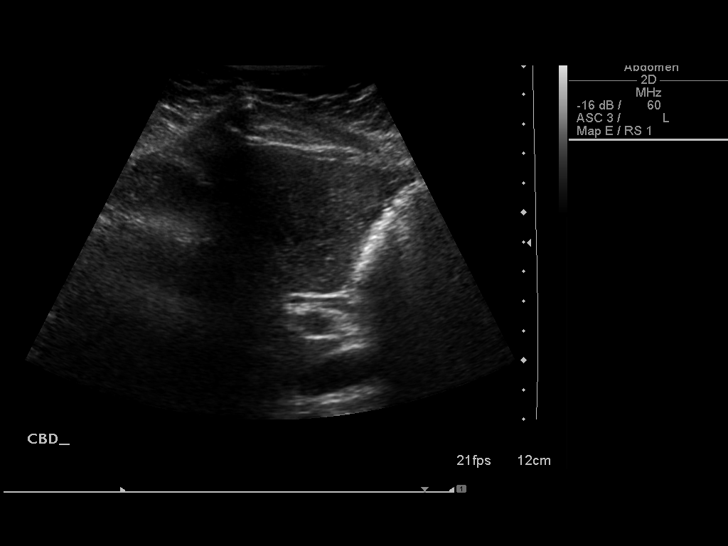
[im 58/87]
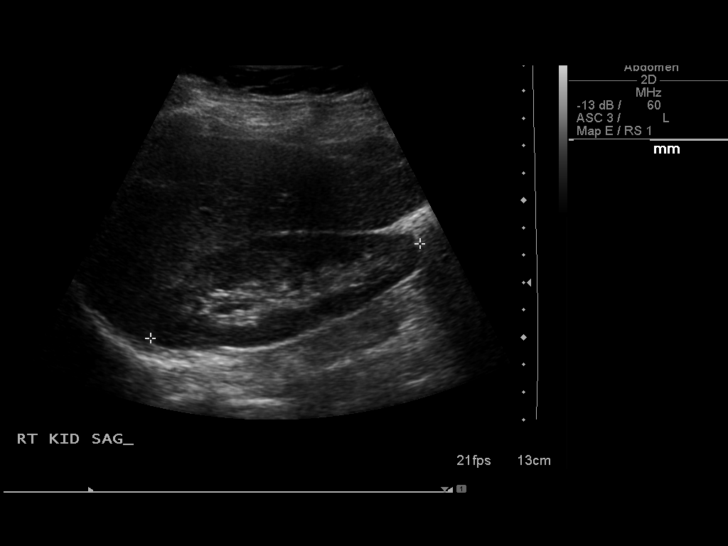
[im 65/87]
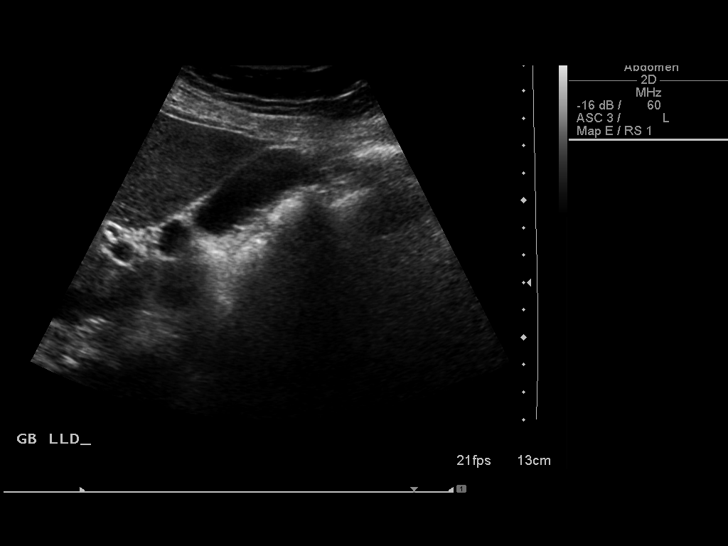
[im 72/87]
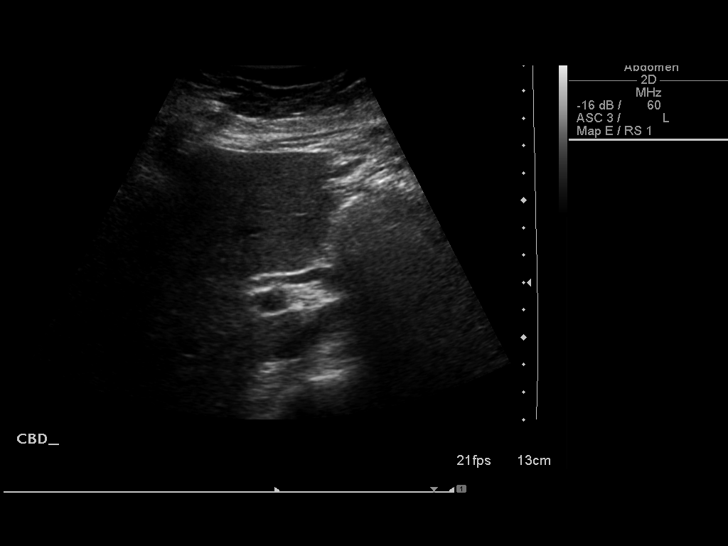
[im 79/87]
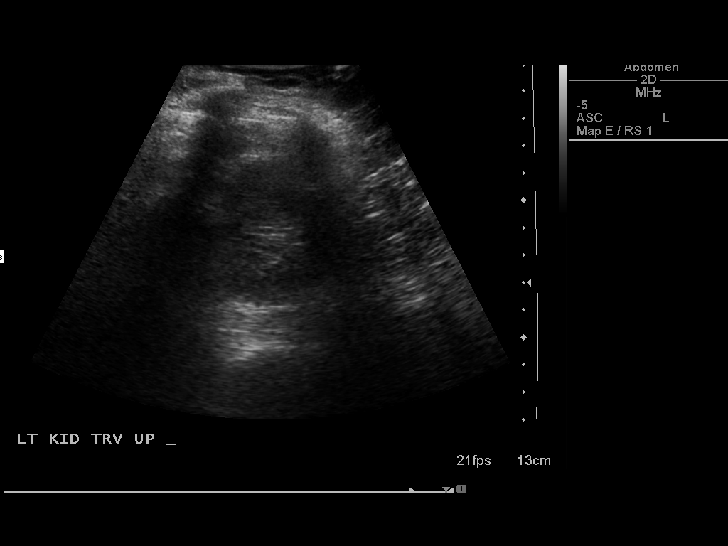
[im 87/87]
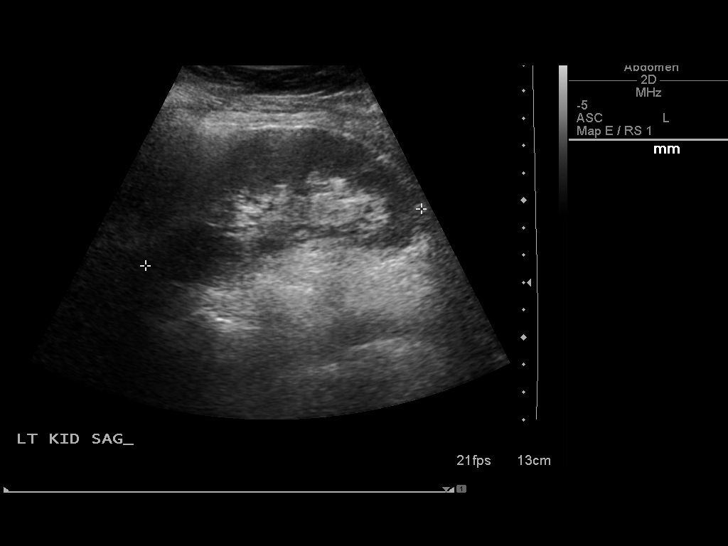

[14 of 25 positions shown; findings below may reference images not displayed]

FINDINGS: Gallbladder is visualized in multiple projections.
Within the gallbladder, there are several nonmoving and
nonshadowing echogenic foci measuring between 2 and 3 mm in size,,
probably representing small polyps.  There are no echogenic foci in
the gallbladder which move and shadow as is expected with
gallstones. There is no gallbladder wall thickening or
pericholecystic fluid collection.

There is no intrahepatic, common hepatic, common bile duct
dilatation.  The pancreas appears normal.  No focal liver lesions
are identified.

Spleen is normal in size and homogeneous in echotexture.  Kidneys
bilaterally appear normal.  There is no ascites.  Aorta is
nonaneurysmal.  Inferior vena cava appears normal.
CONCLUSION: Small echogenic foci in the gallbladder which probably
represent small polyps.  Given this finding, a followup study in 4
to 6 months to assess for stability would be warranted.  If there
is concern for gallbladder pathology beyond findings described on
this study, nuclear medicine hepatobiliary imaging study could be
helpful to further assess.

Study otherwise unremarkable.

## 2015-03-05 ENCOUNTER — Ambulatory Visit (INDEPENDENT_AMBULATORY_CARE_PROVIDER_SITE_OTHER): Payer: Medicare Other | Admitting: Internal Medicine

## 2015-03-05 ENCOUNTER — Other Ambulatory Visit (INDEPENDENT_AMBULATORY_CARE_PROVIDER_SITE_OTHER): Payer: Medicare Other

## 2015-03-05 ENCOUNTER — Encounter: Payer: Self-pay | Admitting: Internal Medicine

## 2015-03-05 VITALS — BP 104/68 | HR 84 | Ht 61.0 in | Wt 147.0 lb

## 2015-03-05 DIAGNOSIS — R197 Diarrhea, unspecified: Secondary | ICD-10-CM | POA: Diagnosis not present

## 2015-03-05 DIAGNOSIS — R143 Flatulence: Secondary | ICD-10-CM

## 2015-03-05 DIAGNOSIS — IMO0001 Reserved for inherently not codable concepts without codable children: Secondary | ICD-10-CM

## 2015-03-05 DIAGNOSIS — R1084 Generalized abdominal pain: Secondary | ICD-10-CM | POA: Diagnosis not present

## 2015-03-05 DIAGNOSIS — K824 Cholesterolosis of gallbladder: Secondary | ICD-10-CM

## 2015-03-05 DIAGNOSIS — K625 Hemorrhage of anus and rectum: Secondary | ICD-10-CM

## 2015-03-05 DIAGNOSIS — K2270 Barrett's esophagus without dysplasia: Secondary | ICD-10-CM | POA: Diagnosis not present

## 2015-03-05 DIAGNOSIS — K921 Melena: Secondary | ICD-10-CM | POA: Diagnosis not present

## 2015-03-05 LAB — CBC WITH DIFFERENTIAL/PLATELET
Basophils Absolute: 0 10*3/uL (ref 0.0–0.1)
Basophils Relative: 0.3 % (ref 0.0–3.0)
EOS ABS: 0.1 10*3/uL (ref 0.0–0.7)
EOS PCT: 2.4 % (ref 0.0–5.0)
HEMATOCRIT: 40.7 % (ref 36.0–46.0)
HEMOGLOBIN: 13.8 g/dL (ref 12.0–15.0)
LYMPHS PCT: 31.7 % (ref 12.0–46.0)
Lymphs Abs: 1.5 10*3/uL (ref 0.7–4.0)
MCHC: 33.8 g/dL (ref 30.0–36.0)
MCV: 94.1 fl (ref 78.0–100.0)
Monocytes Absolute: 0.5 10*3/uL (ref 0.1–1.0)
Monocytes Relative: 10.1 % (ref 3.0–12.0)
NEUTROS ABS: 2.6 10*3/uL (ref 1.4–7.7)
Neutrophils Relative %: 55.5 % (ref 43.0–77.0)
PLATELETS: 228 10*3/uL (ref 150.0–400.0)
RBC: 4.33 Mil/uL (ref 3.87–5.11)
RDW: 13 % (ref 11.5–15.5)
WBC: 4.7 10*3/uL (ref 4.0–10.5)

## 2015-03-05 MED ORDER — DICYCLOMINE HCL 10 MG PO CAPS: 10.0000 mg | ORAL_CAPSULE | Freq: Three times a day (TID) | ORAL | Status: DC | PRN

## 2015-03-05 MED ORDER — HYDROCORTISONE ACE-PRAMOXINE 2.5-1 % RE CREA: 1.0000 "application " | TOPICAL_CREAM | Freq: Three times a day (TID) | RECTAL | Status: DC

## 2015-03-05 NOTE — Progress Notes (Signed)
Linda Berg 03-May-1946 [id number]  Note: This dictation was prepared with Dragon digital system. Any transcriptional errors that result from this procedure are unintentional.   History of Present Illness: This is a 69 year old white female with dyspepsia, gastroesophageal reflux, upper abdominal pain radiating to her back which occurs on daily basis. She also has an irritable bowel syndrome and has been having diarrhea  alternating with constipation and low volume rectal bleeding with almost each bowel movement. There is a positive family history of gallbladder disease in her father. She had a diagnosis of Barrett's esophagus in 1999 which showed metaplasia at the GE junction. Subsequent endoscopies in 2002, 2004 and 2008 were negative for Barrett's esophagus. Last colonoscopy in September 2008 was normal. Upper abdominal ultrasound in February 2015 showed gallbladder polyps, common bile duct was 3 mm and gallbladder wall was normal. She continues to have constant indigestion especially postprandially. She has tried  pantoprazole without improvement. She is currently on Pepcid 20 mg twice a day. She is also on Plaquenil for rheumatoid arthritis. She was seen in July 2014 by Eloisa Northern for constipation and was started on MiraLAX    Past Medical History  Diagnosis Date  . GERD (gastroesophageal reflux disease)   . Barrett esophagus   . Hiatal hernia   . Diverticulosis   . Dyslipidemia   . Hypothyroidism   . Hearing loss   . Rheumatoid arteritis   . Facial numbness     Past Surgical History  Procedure Laterality Date  . Mandible surgery  9/93    pallete expansion  . Tonsillectomy    . Tubal ligation    . Intrauterine device insertion    . Iud removal    . Left oophorectomy  03/13/98  . Knee arthroscopy  12/25/11    left    Allergies  Allergen Reactions  . Prednisone Other (See Comments)    Stomach spasms  . Terbinafine Hcl Rash    Family history and social history have  been reviewed.  Review of Systems: Burning pain in upper abdomen radiating along both costal margins. Low volume rectal bleeding.  The remainder of the 10 point ROS is negative except as outlined in the H&P  Physical Exam: General Appearance Well developed, in no distress Eyes  Non icteric  HEENT  Non traumatic, normocephalic  Mouth No lesion, tongue papillated, no cheilosis Neck Supple without adenopathy, thyroid not enlarged, no carotid bruits, no JVD Lungs Clear to auscultation bilaterally COR Normal S1, normal S2, regular rhythm, no murmur, quiet precordium Abdomen soft diffusely tender in left lower middle quadrant and across the upper abdomen. Her edge at costal margin. Normal active bowel sounds. Rectal and anoscopic exam revealed normal anal canal no bleeding or fissures. No hemorrhoids. Formed Hemoccult-negative stool at 5 cm. The rectal ampulla appears normal there is no evidence of proctitis. Extremities  No pedal edema Skin No lesions Neurological Alert and oriented x 3 Psychological Normal mood and affect  Assessment and Plan:   69 year old white female with dyspepsia indigestion and gallbladder polyps as well as family history of gallbladder disease.  Diffuse abdominal pain as well as rectal bleeding with negative anoscopic exam and Hemoccult-negative stool. We will proceed with CT scan of the abdomen and the pelvis with IV and oral contrast. We will also check CBC, sprue panel and metabolic panel  History of Barrett's esophagus in 1999 but no Barrett's esophagus on 3 subsequent upper endoscopies. She will likely need repeat endoscopy but she is not ready  to do that yet. She will use Gaviscon 2 every 2-3 hours in addition to famotidine. She is reluctant to take PPIs because of possible side effects and reports of Alzheimers disease.  Changing bowel habits. Start Bentyl 10 mg when necessary while we are awaiting results of the blood test, she is due for a colonoscopy ,last  exam September 2008. No cause for rectal bleeding found on anoscopic exam. This will have to be evaluated  by Dr.Nandigam . Endoscopy and colonoscopy may be done at the same time     Lina Sar 03/05/2015

## 2015-03-05 NOTE — Patient Instructions (Signed)
Your physician has requested that you go to the basement for the following lab work before leaving today: Cmet, CBC and Celiac panel.  You can take Gaviscon over the counter 1-2 tablets by mouth as needed.  We have sent the following medications to your pharmacy for you to pick up at your convenience:Analpram and Bentyl.  You have been scheduled for a CT scan of the abdomen and pelvis at [facility] ([address]---this is in the same building as Press [facility]).   You are scheduled on 03/07/15 at 2:30pm. You should arrive 15 minutes prior to your appointment time for registration. Please follow the written instructions below on the day of your exam:  WARNING: IF YOU ARE ALLERGIC TO IODINE/X-RAY DYE, PLEASE NOTIFY RADIOLOGY IMMEDIATELY AT [phone]! YOU WILL BE GIVEN A 13 HOUR PREMEDICATION PREP.  1) Do not eat or drink anything after 10:30am (4 hours prior to your test) 2) You have been given 2 bottles of oral contrast to drink. The solution may taste better if refrigerated, but do NOT add ice or any other liquid to this solution. Shake well before drinking.    Drink 1 bottle of contrast @ 12:30pm (2 hours prior to your exam)  Drink 1 bottle of contrast @ 1:30pm (1 hour prior to your exam)  You may take any medications as prescribed with a small amount of water except for the following: Metformin, Glucophage, Glucovance, Avandamet, Riomet, Fortamet, Actoplus Met, Janumet, Glumetza or Metaglip. The above medications must be held the day of the exam AND 48 hours after the exam.  The purpose of you drinking the oral contrast is to aid in the visualization of your intestinal tract. The contrast solution may cause some diarrhea. Before your exam is started, you will be given a small amount of fluid to drink. Depending on your individual set of symptoms, you may also receive an intravenous injection of x-ray contrast/dye. Plan on being at [facility] for 30 minutes or  long, depending on the type of exam you are having performed.  This test typically takes 30-45 minutes to complete.  If you have any questions regarding your exam or if you need to reschedule, you may call the CT department at [phone] between the hours of 8:00 am and 5:00 pm, Monday-Friday.  ________________________________________________________________________

## 2015-03-06 LAB — COMPREHENSIVE METABOLIC PANEL
ALBUMIN: 4.5 g/dL (ref 3.5–5.2)
ALK PHOS: 75 U/L (ref 39–117)
ALT: 24 U/L (ref 0–35)
AST: 21 U/L (ref 0–37)
BUN: 20 mg/dL (ref 6–23)
CO2: 29 meq/L (ref 19–32)
Calcium: 9.8 mg/dL (ref 8.4–10.5)
Chloride: 103 mEq/L (ref 96–112)
Creatinine, Ser: 0.82 mg/dL (ref 0.40–1.20)
GFR: 73.49 mL/min (ref >60.00)
Glucose, Bld: 89 mg/dL (ref 70–99)
Potassium: 4.8 mEq/L (ref 3.5–5.1)
SODIUM: 140 meq/L (ref 135–145)
TOTAL PROTEIN: 7.7 g/dL (ref 6.0–8.3)
Total Bilirubin: 0.3 mg/dL (ref 0.2–1.2)

## 2015-03-06 LAB — CELIAC PANEL 10
Endomysial Screen: NEGATIVE
Gliadin IgA: 6 Units (ref <20)
Gliadin IgG: 2 Units (ref <20)
IGA: 196 mg/dL (ref 69–380)
Tissue Transglut Ab: 2 U/mL (ref <6)
Tissue Transglutaminase Ab, IgA: 1 U/mL (ref <4)

## 2015-03-07 ENCOUNTER — Ambulatory Visit (INDEPENDENT_AMBULATORY_CARE_PROVIDER_SITE_OTHER)
Admission: RE | Admit: 2015-03-07 | Discharge: 2015-03-07 | Disposition: A | Discharge status: Home / Self Care | Payer: Medicare Other | Source: Ambulatory Visit | Attending: Internal Medicine | Admitting: Internal Medicine

## 2015-03-07 ENCOUNTER — Encounter: Payer: Self-pay | Admitting: Internal Medicine

## 2015-03-07 DIAGNOSIS — K824 Cholesterolosis of gallbladder: Secondary | ICD-10-CM | POA: Diagnosis not present

## 2015-03-07 DIAGNOSIS — R143 Flatulence: Secondary | ICD-10-CM | POA: Diagnosis not present

## 2015-03-07 DIAGNOSIS — R1084 Generalized abdominal pain: Secondary | ICD-10-CM | POA: Diagnosis not present

## 2015-03-07 DIAGNOSIS — R197 Diarrhea, unspecified: Secondary | ICD-10-CM | POA: Diagnosis not present

## 2015-03-07 DIAGNOSIS — IMO0001 Reserved for inherently not codable concepts without codable children: Secondary | ICD-10-CM

## 2015-04-09 ENCOUNTER — Other Ambulatory Visit: Payer: Self-pay | Admitting: *Deleted

## 2015-07-11 ENCOUNTER — Encounter: Payer: Self-pay | Admitting: Gastroenterology

## 2015-07-11 ENCOUNTER — Ambulatory Visit (INDEPENDENT_AMBULATORY_CARE_PROVIDER_SITE_OTHER): Payer: Medicare Other | Admitting: Gastroenterology

## 2015-07-11 VITALS — BP 108/60 | HR 78 | Ht 61.0 in | Wt 149.5 lb

## 2015-07-11 DIAGNOSIS — K5900 Constipation, unspecified: Secondary | ICD-10-CM | POA: Diagnosis not present

## 2015-07-11 DIAGNOSIS — K219 Gastro-esophageal reflux disease without esophagitis: Secondary | ICD-10-CM

## 2015-07-11 DIAGNOSIS — K589 Irritable bowel syndrome without diarrhea: Secondary | ICD-10-CM | POA: Diagnosis not present

## 2015-07-11 MED ORDER — ESOMEPRAZOLE MAGNESIUM 40 MG PO PACK: 40.0000 mg | PACK | ORAL | Status: DC | PRN

## 2015-07-11 NOTE — Progress Notes (Signed)
Linda Berg    [id number]    1945/11/02  Primary Care Physician:KAPLAN,KRISTEN, PA-C  Referring Physician: Richmond Campbell, PA-C [address], [place] [address]  Chief complaint:  Dyspepsia, GERD, constipation  HPI:  69 year old white female with dyspepsia, gastroesophageal reflux, here for follow-up visit. She also has an irritable bowel syndrome and has been having constipation alternating with diarrhea She had a diagnosis of Barrett's esophagus in 1999 which showed metaplasia at the GE junction. Subsequent endoscopies in 2002, 2004 and 2008 were negative for Barrett's esophagus. Last colonoscopy in September 2008 was normal. Upper abdominal ultrasound in February 2015 showed ?probable tiny gallbladder polyps, common bile duct was 3 mm and gallbladder wall was normal.  She is currently on Pepcid 20 mg twice a day and feels she continues to have reflux symptoms and is taking over-the-counter Zegrid as needed. She is also on Plaquenil for rheumatoid arthritis.  She is taking Benefiber for constipation and is currently not on any laxatives    Outpatient Encounter Prescriptions as of 07/11/2015  Medication Sig  . AMBULATORY NON FORMULARY MEDICATION ALLERGY SHOTS Once a week  . aspirin 81 MG tablet Take 81 mg by mouth 2 (two) times daily.  Marland Kitchen azelastine (ASTELIN) 137 MCG/SPRAY nasal spray Place 1 spray into the nose 2 (two) times daily. Use in each nostril as directed  . B Complex-C (SUPER B COMPLEX PO) Take 1 tablet by mouth daily.  . Biotin 5000 MCG TABS Take 1 tablet by mouth 1 day or 1 dose.   . cetirizine (ZYRTEC) 10 MG tablet Take 10 mg by mouth at bedtime.  . Cholecalciferol (D3 ADULT) 1000 UNITS CHEW Chew by mouth.  . Coenzyme Q10 (CO Q 10) 100 MG CAPS Take 1 capsule by mouth 2 (two) times daily.  . cycloSPORINE (RESTASIS) 0.05 % ophthalmic emulsion Place 1 drop into both eyes 2 (two) times daily.  . famotidine (PEPCID) 20 MG tablet Take 20 mg by  mouth daily.   . hydroxychloroquine (PLAQUENIL) 200 MG tablet Take 200 mg by mouth daily.   Boris Lown Oil 300 MG CAPS Take 1 capsule by mouth daily.  Marland Kitchen lovastatin (MEVACOR) 20 MG tablet Take 20 mg by mouth daily.  . Magnesium 250 MG TABS Take 1 tablet by mouth 2 (two) times daily.  . Probiotic Product (PROBIOTIC DAILY PO) Take 1 tablet by mouth daily.   Marland Kitchen thyroid (ARMOUR) 60 MG tablet Take 60 mg by mouth daily before breakfast.  . vitamin C (ASCORBIC ACID) 500 MG tablet Take 500 mg by mouth 2 (two) times daily.  . Zinc 50 MG CAPS Take by mouth.  . [DISCONTINUED] Calcium Carb-Cholecalciferol (CALCIUM-VITAMIN D) 600-400 MG-UNIT TABS Take 1 tablet by mouth 1 day or 1 dose.   . [DISCONTINUED] dicyclomine (BENTYL) 10 MG capsule Take 1 capsule (10 mg total) by mouth 3 (three) times daily as needed for spasms.  . [DISCONTINUED] hydrocortisone-pramoxine (ANALPRAM HC) 2.5-1 % rectal cream Place 1 application rectally 3 (three) times daily.   No facility-administered encounter medications on file as of 07/11/2015.    Allergies as of 07/11/2015 - Review Complete 07/11/2015  Allergen Reaction Noted  . Prednisone Other (See Comments) 01/08/2009  . Terbinafine hcl Rash 04/23/2014    Past Medical History  Diagnosis Date  . GERD (gastroesophageal reflux disease)   . Barrett esophagus   . Hiatal hernia   . Diverticulosis   . Dyslipidemia   . Hypothyroidism   .  Hearing loss   . Rheumatoid arteritis   . Facial numbness     Past Surgical History  Procedure Laterality Date  . Mandible surgery  9/93    pallete expansion  . Tonsillectomy    . Tubal ligation    . Intrauterine device insertion    . Iud removal    . Left oophorectomy  03/13/98  . Knee arthroscopy  12/25/11    left    Family History  Problem Relation Age of Onset  . Colon cancer Neg Hx   . Heart disease Mother   . Heart disease Maternal Aunt     aunt x 2  . Stroke Father   . Irritable bowel syndrome Son   . GER disease Son      Social History   Social History  . Marital Status: Married    Spouse Name: N/A  . Number of Children: 2  . Years of Education: 12   Occupational History  . retired    Social History Main Topics  . Smoking status: Never Smoker   . Smokeless tobacco: Never Used  . Alcohol Use: No  . Drug Use: No  . Sexual Activity: Not on file   Other Topics Concern  . Not on file   Social History Narrative   Live with husband at home.   Left-handed.   1 cup coffee/day      Review of systems: Review of Systems  Constitutional: Negative for fever and chills.  HENT: Negative.   Eyes: Negative for blurred vision.  Respiratory: Negative for cough, shortness of breath and wheezing.   Cardiovascular: Negative for chest pain and palpitations.  Gastrointestinal: as per HPI Genitourinary: Negative for dysuria, urgency, frequency and hematuria.  Musculoskeletal: Negative for myalgias, back pain and joint pain.  Skin: Negative for itching and rash.  Neurological: Negative for dizziness, tremors, focal weakness, seizures and loss of consciousness.  Endo/Heme/Allergies: Negative for environmental allergies.  Psychiatric/Behavioral: Negative for depression, suicidal ideas and hallucinations.  All other systems reviewed and are negative.   Physical Exam: Filed Vitals:   07/11/15 1110  BP: 108/60  Pulse: 78   Gen:      No acute distress HEENT:  EOMI, sclera anicteric Neck:     No masses; no thyromegaly Lungs:    Clear to auscultation bilaterally; normal respiratory effort CV:         Regular rate and rhythm; no murmurs Abd:      + bowel sounds; soft, non-tender; no palpable masses,    distension Ext:    No edema; adequate peripheral perfusion Skin:      Warm and dry; no rash Neuro: alert and oriented x 3 Psych: normal mood and affect  Data Reviewed:  Reviewed pertinent GI workup in the past   Assessment and Plan/Recommendations:  69 year old female with history of  irritable bowel syndrome and chronic GERD here for follow-up GERD: Continue Pepcid and also advised patient to take PPI as needed She is afraid of  continuous PPI and the long term side effects of possible osteoporosis, Alzheimer's and kidney disease IBS predominant constipation: The patient was given samples of Linzess 145 g daily, if she does not have diarrhea will send the prescription for the medication Also advised patient to limit fiber intake, small frequent meals Sleep with head end elevation and dinner 3 hours before bedtime Return in 3 months   K. Scherry Ran , MD [phone] Mon-Fri 8a-5p [phone] after 5p, weekends, holidays

## 2015-07-11 NOTE — Patient Instructions (Addendum)
Linzess samples  Follow up in 3 months

## 2015-07-12 ENCOUNTER — Telehealth: Payer: Self-pay | Admitting: Gastroenterology

## 2015-07-12 NOTE — Telephone Encounter (Signed)
Returned patients call, Patient does not want packets that was sent in of her nexium  She wants capsules sent in       [facility] pharmacy and chaged rx to capsules

## 2015-08-16 ENCOUNTER — Other Ambulatory Visit: Payer: Self-pay | Admitting: *Deleted

## 2015-08-16 MED ORDER — ESOMEPRAZOLE MAGNESIUM 40 MG PO CPDR: 40.0000 mg | DELAYED_RELEASE_CAPSULE | Freq: Every day | ORAL | Status: DC

## 2015-09-11 ENCOUNTER — Telehealth: Payer: Self-pay | Admitting: *Deleted

## 2015-09-11 DIAGNOSIS — K824 Cholesterolosis of gallbladder: Secondary | ICD-10-CM

## 2015-09-11 NOTE — Telephone Encounter (Signed)
-----   Message from Napoleon Form, MD sent at 09/11/2015 10:00 AM EST ----- Yes she will need f/u abd ultrasound for surveillance. Ct probably couldn't pick them, report doesn't mention. Thanks VN ----- Message -----    From: Daphine Deutscher, RN    Sent: 09/11/2015   8:37 AM      To: Napoleon Form, MD  Dr. Lavon Paganini, I had a note that Dr. Juanda Chance recommended an [place] in one year to check gall bladder polyp. Does the patient need this? Sutton Hirsch ----- Message -----    From: Daphine Deutscher, RN    Sent: 09/11/2015      To: Daphine Deutscher, RN  Will be Nandigam's ----- Message -----    From: Daphine Deutscher, RN    Sent: 09/16/2015      To: Daphine Deutscher, RN  Needs ultrasound to f/u on gall bladder polyp per DB

## 2015-09-11 NOTE — Telephone Encounter (Signed)
Scheduled US abdomen at [facility] on 09/18/15 at 8:45 AM. NPO after midnight. Left a message for patient to call back.

## 2015-09-12 NOTE — Telephone Encounter (Signed)
Patient notified of appointment date, time and instructions. 

## 2015-09-18 ENCOUNTER — Ambulatory Visit (HOSPITAL_COMMUNITY)
Admission: RE | Admit: 2015-09-18 | Discharge: 2015-09-18 | Disposition: A | Discharge status: Home / Self Care | Payer: Medicare Other | Source: Ambulatory Visit | Attending: Gastroenterology | Admitting: Gastroenterology

## 2015-09-18 DIAGNOSIS — K824 Cholesterolosis of gallbladder: Secondary | ICD-10-CM | POA: Diagnosis present

## 2015-11-28 ENCOUNTER — Other Ambulatory Visit: Payer: Self-pay

## 2015-11-28 DIAGNOSIS — Z1231 Encounter for screening mammogram for malignant neoplasm of breast: Secondary | ICD-10-CM

## 2015-12-12 ENCOUNTER — Ambulatory Visit
Admission: RE | Admit: 2015-12-12 | Discharge: 2015-12-12 | Disposition: A | Discharge status: Home / Self Care | Payer: Medicare Other | Source: Ambulatory Visit

## 2015-12-12 DIAGNOSIS — Z1231 Encounter for screening mammogram for malignant neoplasm of breast: Secondary | ICD-10-CM

## 2016-06-14 ENCOUNTER — Other Ambulatory Visit: Payer: Self-pay | Admitting: Rheumatology

## 2016-06-15 ENCOUNTER — Telehealth: Payer: Self-pay | Admitting: Radiology

## 2016-06-15 NOTE — Telephone Encounter (Signed)
Patient needs follow up appt with Dr Deveshwar pls call to make appt. March 

## 2016-06-15 NOTE — Telephone Encounter (Signed)
Last visit 04/28/16 Next visit not scheduled, message sent for scheduling Labs 04/24/16 WNl Eye exam WNL 06/2015 Ok to refill per Dr Corliss Skains

## 2016-06-16 NOTE — Telephone Encounter (Signed)
LMOM for patient to call back and schedule March follow up.  

## 2016-08-28 ENCOUNTER — Other Ambulatory Visit: Payer: Self-pay | Admitting: Rheumatology

## 2016-08-28 DIAGNOSIS — Z79899 Other long term (current) drug therapy: Secondary | ICD-10-CM

## 2016-08-28 NOTE — Telephone Encounter (Signed)
Last visit 04/28/16 Next visit 10/19/16 Labs 04/24/16 WNL Eye exam was due in November / patient states she had this done in May 2017  Ok to refill per Dr Corliss Skains

## 2016-10-09 DIAGNOSIS — M19042 Primary osteoarthritis, left hand: Secondary | ICD-10-CM

## 2016-10-09 DIAGNOSIS — M8589 Other specified disorders of bone density and structure, multiple sites: Secondary | ICD-10-CM | POA: Insufficient documentation

## 2016-10-09 DIAGNOSIS — M19041 Primary osteoarthritis, right hand: Secondary | ICD-10-CM | POA: Insufficient documentation

## 2016-10-09 DIAGNOSIS — Z8639 Personal history of other endocrine, nutritional and metabolic disease: Secondary | ICD-10-CM | POA: Insufficient documentation

## 2016-10-09 DIAGNOSIS — Z8719 Personal history of other diseases of the digestive system: Secondary | ICD-10-CM | POA: Insufficient documentation

## 2016-10-09 DIAGNOSIS — Z8739 Personal history of other diseases of the musculoskeletal system and connective tissue: Secondary | ICD-10-CM | POA: Insufficient documentation

## 2016-10-09 DIAGNOSIS — Z79899 Other long term (current) drug therapy: Secondary | ICD-10-CM | POA: Insufficient documentation

## 2016-10-09 DIAGNOSIS — M0579 Rheumatoid arthritis with rheumatoid factor of multiple sites without organ or systems involvement: Secondary | ICD-10-CM | POA: Insufficient documentation

## 2016-10-09 DIAGNOSIS — M3500 Sicca syndrome, unspecified: Secondary | ICD-10-CM | POA: Insufficient documentation

## 2016-10-09 NOTE — Progress Notes (Signed)
Office Visit Note  Patient: Linda Berg             Date of Birth: 05-26-1946           MRN: [id number]             PCP: Tula Nakayama Referring: Aletha Halim., PA-C Visit Date: 10/19/2016 Occupation: _0 @    Subjective:  Pain in hands and shoulders   History of Present Illness: Linda Berg is a 71 y.o. female with history of rheumatoid arthritis area and she states she's been having some discomfort in her hands and shoulders. She denies any joint swelling. She also has some lower back discomfort. She denies any radiculopathy. She has some discomfort in bilateral trochanteric area especially when she is sleeping at nighttime.  Activities of Daily Living:  Patient reports morning stiffness for 43mnutes.   Patient Reports nocturnal pain.  Difficulty dressing/grooming: Denies Difficulty climbing stairs: Denies Difficulty getting out of chair: Denies Difficulty using hands for taps, buttons, cutlery, and/or writing: Denies   Review of Systems  Constitutional: Positive for fatigue. Negative for night sweats, weight gain, weight loss and weakness.  HENT: Negative for mouth sores, trouble swallowing, trouble swallowing, mouth dryness and nose dryness.   Eyes: Negative for pain, redness, visual disturbance and dryness.  Respiratory: Negative for cough, shortness of breath and difficulty breathing.   Cardiovascular: Negative for chest pain, palpitations, hypertension, irregular heartbeat and swelling in legs/feet.  Gastrointestinal: Positive for constipation. Negative for blood in stool and diarrhea.  Endocrine: Negative for increased urination.  Genitourinary: Negative for vaginal dryness.  Musculoskeletal: Positive for arthralgias, joint pain and morning stiffness. Negative for joint swelling, myalgias, muscle weakness, muscle tenderness and myalgias.  Skin: Negative for color change, rash, hair loss, skin tightness, ulcers and sensitivity to sunlight.    Allergic/Immunologic: Negative for susceptible to infections.  Neurological: Negative for dizziness, memory loss and night sweats.  Hematological: Negative for swollen glands.  Psychiatric/Behavioral: Positive for sleep disturbance. Negative for depressed mood. The patient is nervous/anxious.     PMFS History:  Patient Active Problem List   Diagnosis Date Noted  . Rheumatoid arthritis involving multiple sites with positive rheumatoid factor ([place] 10/09/2016  . Sicca syndrome, unspecified ([place] 10/09/2016  . Osteopenia of multiple sites 10/09/2016  . History of scoliosis 10/09/2016  . History of gastroesophageal reflux (GERD) 10/09/2016  . History of hypothyroidism 10/09/2016  . History of hyperlipidemia 10/09/2016  . High risk medication use 10/09/2016  . Primary osteoarthritis of both hands 10/09/2016  . Paresthesia 10/11/2014  . Hyperlipidemia 10/11/2014  . Small vessel disease, 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/11/2014  . Tinnitus 09/17/2014  . Abdominal pain 09/05/2014  . Acute cystitis 09/05/2014  . Arthritis 09/05/2014  . Cough 09/05/2014  . Esophagitis 09/05/2014  . Facial numbness 09/05/2014  . Difficulty hearing 09/05/2014  . H/O disease 09/05/2014  . HLD (hyperlipidemia) 09/05/2014  . Muscle ache 09/05/2014  . Symptoms involving urinary system 09/05/2014  . Awareness of heartbeats 09/05/2014  . Acne erythematosa 09/05/2014  . Absence of bladder continence 09/05/2014  . Urgency of micturation 09/05/2014  . Routine general medical examination at a health care facility 09/05/2014  . DYSPHAGIA 01/09/2009  . HYPOTHYROIDISM 11/04/2007  . DYSLIPIDEMIA 11/04/2007  . GERD 11/04/2007  . 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/04/2007  . DUODENITIS, WITHOUT HEMORRHAGE 11/04/2007  . HIATAL HERNIA 11/04/2007  . DIVERTICULOSIS, COLON 11/04/2007  . CONSTIPATION, CHRONIC 11/04/2007  . RECTAL BLEEDING 11/04/2007    Past Medical History:  Diagnosis Date  .  Barrett esophagus   . Diverticulosis   .  Dyslipidemia   . Facial numbness   . GERD (gastroesophageal reflux disease)   . Hearing loss   . Hiatal hernia   . Hypothyroidism   . Rheumatoid arteritis     Family History  Problem Relation Age of Onset  . Colon cancer Neg Hx   . Heart disease Mother   . Heart disease Maternal Aunt     aunt x 2  . Stroke Father   . Irritable bowel syndrome Son   . GER disease Son    Past Surgical History:  Procedure Laterality Date  . INTRAUTERINE DEVICE INSERTION    . IUD REMOVAL    . KNEE ARTHROSCOPY  12/25/11   left  . LEFT OOPHORECTOMY  03/13/98  . MANDIBLE SURGERY  9/93   pallete expansion  . TONSILLECTOMY    . TUBAL LIGATION     Social History   Social History Narrative   Live with husband at home.   Left-handed.   1 cup coffee/day     Objective: Vital Signs: BP 111/69   Pulse 67   Resp 12   Ht _0  (1.549 m)   Wt 152 lb (68.9 kg)   BMI 28.72 kg/m    Physical Exam  Constitutional: She is oriented to person, place, and time. She appears well-developed and well-nourished.  HENT:  Head: Normocephalic and atraumatic.  Eyes: Conjunctivae and EOM are normal.  Neck: Normal range of motion.  Cardiovascular: Normal rate, regular rhythm, normal heart sounds and intact distal pulses.   Pulmonary/Chest: Effort normal and breath sounds normal.  Abdominal: Soft. Bowel sounds are normal.  Lymphadenopathy:    She has no cervical adenopathy.  Neurological: She is alert and oriented to person, place, and time.  Skin: Skin is warm and dry. Capillary refill takes less than 2 seconds.  Psychiatric: She has a normal mood and affect. Her behavior is normal.  Nursing note and vitals reviewed.    Musculoskeletal Exam: C-spine and thoracic lumbar spine good range of motion. Shoulder joints elbow joints wrist joints with good range of motion. She has some thickening of her MCP joints with no synovitis. She has DIP PIP thickening in her hands consistent with osteoarthritis. Hip joints knee  joints ankles MTPs PIPs with good range of motion with no synovitis. She had tenderness on palpation over bilateral trochanteric bursa area consistent with trochanteric bursitis.  CDAI Exam: CDAI Homunculus Exam:   Joint Counts:  CDAI Tender Joint count: 0 CDAI Swollen Joint count: 0  Global Assessments:  Patient Global Assessment: 4 Provider Global Assessment: 2  CDAI Calculated Score: 6    Investigation: Findings:   Labs from April 05, 2012 show CMP as normal.  CBC with diff is normal.  TSH is normal.  Sed rate is normal at 35.  Uric acid is normal at 5.2.  Hepatitis panel is negative.  C3, C4 is negative.  UA is normal.  CCP is negative.  TB Gold is negative.  ENA is pending.  Rheumatoid factor is increased at 17.1.  ANA is positive but titer has not been done.  02/04/2015 X-rays done today, 2 views, show bilateral hands are normal.  Labs from 04/23/2016 show CMP with GFR is normal.  CBC with diff is normal 01/08/2016 eye exam done by Dr. Luretha Rued did not show any macular toxicity    Imaging: No results found.  Speciality Comments: No specialty comments available.    Procedures:  Large Joint Inj Date/Time: 10/19/2016 11:38 AM Performed by: Bo Merino Authorized by: Bo Merino   Consent Given by:  Patient Site marked: the procedure site was marked   Timeout: prior to procedure the correct patient, procedure, and site was verified   Indications:  Pain Location:  Hip Site:  R greater trochanter Prep: patient was prepped and draped in usual sterile fashion   Needle Size:  27 G Needle Length:  1.5 inches Approach:  Lateral Ultrasound Guidance: No   Fluoroscopic Guidance: No   Arthrogram: No   Medications:  40 mg triamcinolone acetonide 40 MG/ML; 1.5 mL lidocaine 1 % Aspiration Attempted: No   Aspirate amount (mL):  0 Patient tolerance:  Patient tolerated the procedure well with no immediate complications   Allergies: Terbinafine hcl and  Prednisone   Assessment / Plan:     Visit Diagnoses: Rheumatoid arthritis involving multiple sites with positive rheumatoid factor (HCC) - +RF - CCP. Patient has no synovitis on examination she appears to be doing well on low-dose Plaquenil.   High risk medication use - Plaquenil 268m/ day . Her labs are within normal limits and March 2018 and her eye exam was normal in May 2017 we will check her labs again in 5 months. - Plan: CBC with Differential/Platelet, CMP14+EGFR, CBC with Differential/Platelet, CMP14+EGFR  Trochanteric bursitis of both hips: She had a lot of discomfort in bilateral trochanteric area for which exercise were demonstrated and handout was given. After informed consent was obtained right trochanteric bursa was injected at described above. Side effects were reviewed.  Sicca syndrome, unspecified ([place]: Over-the-counter products were discussed.  Primary osteoarthritis of both hands [place] joint protection and muscle strengthening discussed.  History of scoliosis  Osteopenia of multiple sites - Dexa with Ob/ gyn  History of gastroesophageal reflux (GERD)  History of hypothyroidism  History of hyperlipidemia    Orders: Orders Placed This Encounter  Procedures  . Large Joint Injection/Arthrocentesis  . CBC with Differential/Platelet  . CMP14+EGFR   Meds ordered this encounter  Medications  . hydroxychloroquine (PLAQUENIL) 200 MG tablet    Sig: Take 1 tablet (200 mg total) by mouth daily.    Dispense:  90 tablet    Refill:  1    Face-to-face time spent with patient was 30 minutes. 50% of time was spent in counseling and coordination of care.  Follow-Up Instructions: Return in about 5 months (around 03/21/2017) for Rheumatoid arthritis.   SBo Merino MD  Note - This record has been created using DEditor, commissioning  Chart creation errors have been sought, but may not always  have been located. Such creation errors do not reflect on  the standard of  medical care.

## 2016-10-16 LAB — CBC WITH DIFFERENTIAL/PLATELET
Basophils Absolute: 0 10*3/uL (ref 0.0–0.2)
Basos: 1 %
EOS (ABSOLUTE): 0.1 10*3/uL (ref 0.0–0.4)
Eos: 3 %
HEMATOCRIT: 38.5 % (ref 34.0–46.6)
HEMOGLOBIN: 13.1 g/dL (ref 11.1–15.9)
IMMATURE GRANS (ABS): 0 10*3/uL (ref 0.0–0.1)
IMMATURE GRANULOCYTES: 0 %
LYMPHS: 33 %
Lymphocytes Absolute: 1.4 10*3/uL (ref 0.7–3.1)
MCH: 31.3 pg (ref 26.6–33.0)
MCHC: 34 g/dL (ref 31.5–35.7)
MCV: 92 fL (ref 79–97)
MONOCYTES: 7 %
Monocytes Absolute: 0.3 10*3/uL (ref 0.1–0.9)
NEUTROS PCT: 56 %
Neutrophils Absolute: 2.4 10*3/uL (ref 1.4–7.0)
PLATELETS: 257 10*3/uL (ref 150–379)
RBC: 4.18 x10E6/uL (ref 3.77–5.28)
RDW: 12.9 % (ref 12.3–15.4)
WBC: 4.2 10*3/uL (ref 3.4–10.8)

## 2016-10-16 LAB — CMP14+EGFR
A/G RATIO: 1.6 (ref 1.2–2.2)
ALBUMIN: 4.7 g/dL (ref 3.5–4.8)
ALT: 23 IU/L (ref 0–32)
AST: 20 IU/L (ref 0–40)
Alkaline Phosphatase: 105 IU/L (ref 39–117)
BUN / CREAT RATIO: 28 (ref 12–28)
BUN: 21 mg/dL (ref 8–27)
Bilirubin Total: <0.2 mg/dL (ref 0.0–1.2)
CALCIUM: 9.6 mg/dL (ref 8.7–10.3)
CO2: 25 mmol/L (ref 18–29)
Chloride: 100 mmol/L (ref 96–106)
Creatinine, Ser: 0.76 mg/dL (ref 0.57–1.00)
GFR calc Af Amer: 92 mL/min/{1.73_m2} (ref >59)
GFR, EST NON AFRICAN AMERICAN: 80 mL/min/{1.73_m2} (ref >59)
GLOBULIN, TOTAL: 2.9 g/dL (ref 1.5–4.5)
Glucose: 87 mg/dL (ref 65–99)
POTASSIUM: 3.9 mmol/L (ref 3.5–5.2)
Sodium: 142 mmol/L (ref 134–144)
TOTAL PROTEIN: 7.6 g/dL (ref 6.0–8.5)

## 2016-10-16 NOTE — Telephone Encounter (Signed)
wnl

## 2016-10-19 ENCOUNTER — Encounter: Payer: Self-pay | Admitting: Rheumatology

## 2016-10-19 ENCOUNTER — Ambulatory Visit (INDEPENDENT_AMBULATORY_CARE_PROVIDER_SITE_OTHER): Payer: Medicare Other | Admitting: Rheumatology

## 2016-10-19 VITALS — BP 111/69 | HR 67 | Resp 12 | Ht 61.0 in | Wt 152.0 lb

## 2016-10-19 DIAGNOSIS — M7062 Trochanteric bursitis, left hip: Secondary | ICD-10-CM

## 2016-10-19 DIAGNOSIS — M19042 Primary osteoarthritis, left hand: Secondary | ICD-10-CM | POA: Diagnosis not present

## 2016-10-19 DIAGNOSIS — M7061 Trochanteric bursitis, right hip: Secondary | ICD-10-CM

## 2016-10-19 DIAGNOSIS — M3500 Sicca syndrome, unspecified: Secondary | ICD-10-CM | POA: Diagnosis not present

## 2016-10-19 DIAGNOSIS — Z8739 Personal history of other diseases of the musculoskeletal system and connective tissue: Secondary | ICD-10-CM | POA: Diagnosis not present

## 2016-10-19 DIAGNOSIS — M8589 Other specified disorders of bone density and structure, multiple sites: Secondary | ICD-10-CM | POA: Diagnosis not present

## 2016-10-19 DIAGNOSIS — Z8639 Personal history of other endocrine, nutritional and metabolic disease: Secondary | ICD-10-CM | POA: Diagnosis not present

## 2016-10-19 DIAGNOSIS — Z79899 Other long term (current) drug therapy: Secondary | ICD-10-CM

## 2016-10-19 DIAGNOSIS — M0579 Rheumatoid arthritis with rheumatoid factor of multiple sites without organ or systems involvement: Secondary | ICD-10-CM

## 2016-10-19 DIAGNOSIS — M19041 Primary osteoarthritis, right hand: Secondary | ICD-10-CM | POA: Diagnosis not present

## 2016-10-19 DIAGNOSIS — Z8719 Personal history of other diseases of the digestive system: Secondary | ICD-10-CM | POA: Diagnosis not present

## 2016-10-19 MED ORDER — TRIAMCINOLONE ACETONIDE 40 MG/ML IJ SUSP
40.0000 mg | INTRAMUSCULAR | Status: AC | PRN
  Administered 2016-10-19: 40 mg via INTRA_ARTICULAR

## 2016-10-19 MED ORDER — LIDOCAINE HCL 1 % IJ SOLN
1.5000 mL | INTRAMUSCULAR | Status: AC | PRN
  Administered 2016-10-19: 1.5 mL

## 2016-10-19 MED ORDER — HYDROXYCHLOROQUINE SULFATE 200 MG PO TABS: 200.0000 mg | ORAL_TABLET | Freq: Every day | ORAL | 1 refills | Status: DC

## 2016-10-19 NOTE — Patient Instructions (Signed)
Trochanteric Bursitis Rehab Ask your health care provider which exercises are safe for you. Do exercises exactly as told by your health care provider and adjust them as directed. It is normal to feel mild stretching, pulling, tightness, or discomfort as you do these exercises, but you should stop right away if you feel sudden pain or your pain gets worse.Do not begin these exercises until told by your health care provider. Stretching exercises These exercises warm up your muscles and joints and improve the movement and flexibility of your hip. These exercises also help to relieve pain and stiffness. Exercise A: Iliotibial band stretch   1. Lie on your side with your left / right leg in the top position. 2. Bend your left / right knee and grab your ankle. 3. Slowly bring your knee back so your thigh is behind your body. 4. Slowly lower your knee toward the floor until you feel a gentle stretch on the outside of your left / right thigh. If you do not feel a stretch and your knee will not fall farther, place the heel of your other foot on top of your outer knee and pull your thigh down farther. 5. Hold this position for __________ seconds. 6. Slowly return to the starting position. Repeat __________ times. Complete this exercise __________ times a day. Strengthening exercises These exercises build strength and endurance in your hip and pelvis. Endurance is the ability to use your muscles for a long time, even after they get tired. Exercise B: Bridge (  hip extensors) 1. Lie on your back on a firm surface with your knees bent and your feet flat on the floor. 2. Tighten your buttocks muscles and lift your buttocks off the floor until your trunk is level with your thighs. You should feel the muscles working in your buttocks and the back of your thighs. If this exercise is too easy, try doing it with your arms crossed over your chest. 3. Hold this position for __________ seconds. 4. Slowly return to  the starting position. 5. Let your muscles relax completely between repetitions. Repeat __________ times. Complete this exercise __________ times a day. Exercise C: Squats ( knee extensors and  quadriceps) 1. Stand in front of a table, with your feet and knees pointing straight ahead. You may rest your hands on the table for balance but not for support. 2. Slowly bend your knees and lower your hips like you are going to sit in a chair.  Keep your weight over your heels, not over your toes.  Keep your lower legs upright so they are parallel with the table legs.  Do not let your hips go lower than your knees.  Do not bend lower than told by your health care provider.  If your hip pain increases, do not bend as low. 3. Hold this position for __________ seconds. 4. Slowly push with your legs to return to standing. Do not use your hands to pull yourself to standing. Repeat __________ times. Complete this exercise __________ times a day. Exercise D: Hip hike 1. Stand sideways on a bottom step. Stand on your left / right leg with your other foot unsupported next to the step. You can hold onto the railing or wall if needed for balance. 2. Keeping your knees straight and your torso square, lift your left / right hip up toward the ceiling. 3. Hold this position for __________ seconds. 4. Slowly let your left / right hip lower toward the floor, past the starting position.  Your foot should get closer to the floor. Do not lean or bend your knees. Repeat __________ times. Complete this exercise __________ times a day. Exercise E: Single leg stand 1. Stand near a counter or door frame that you can hold onto for balance as needed. It is helpful to stand in front of a mirror for this exercise so you can watch your hip. 2. Squeeze your left / right buttock muscles then lift up your other foot. Do not let your left / right hip push out to the side. 3. Hold this position for __________ seconds. Repeat  __________ times. Complete this exercise __________ times a day. This information is not intended to replace advice given to you by your health care provider. Make sure you discuss any questions you have with your health care provider. Document Released: 09/03/2004 Document Revised: 04/02/2016 Document Reviewed: 07/12/2015 Elsevier Interactive Patient Education  2017 [employer]. Supplements for OA Natural anti-inflammatories  You can purchase these at [employer], [employer] or online.  . Turmeric (capsules)  . Ginger (ginger root or capsules)  . Omega 3 (Fish, flax seeds, chia seeds, walnuts, almonds)  . Tart cherry (dried or extract)   Patient should be under the care of a physician while taking these supplements. This may not be reproduced without the permission of Dr. Pollyann Savoy.

## 2016-11-30 ENCOUNTER — Other Ambulatory Visit: Payer: Self-pay | Admitting: Obstetrics & Gynecology

## 2016-11-30 DIAGNOSIS — Z1231 Encounter for screening mammogram for malignant neoplasm of breast: Secondary | ICD-10-CM

## 2016-12-22 ENCOUNTER — Ambulatory Visit: Payer: Medicare Other

## 2017-01-06 ENCOUNTER — Ambulatory Visit
Admission: RE | Admit: 2017-01-06 | Discharge: 2017-01-06 | Disposition: A | Discharge status: Home / Self Care | Payer: Medicare Other | Source: Ambulatory Visit | Attending: Obstetrics & Gynecology | Admitting: Obstetrics & Gynecology

## 2017-01-06 DIAGNOSIS — Z1231 Encounter for screening mammogram for malignant neoplasm of breast: Secondary | ICD-10-CM

## 2017-03-16 DIAGNOSIS — M7061 Trochanteric bursitis, right hip: Secondary | ICD-10-CM | POA: Insufficient documentation

## 2017-03-16 DIAGNOSIS — M7062 Trochanteric bursitis, left hip: Secondary | ICD-10-CM

## 2017-03-16 NOTE — Progress Notes (Signed)
Office Visit Note  Patient: Linda Berg             Date of Birth: [birth year]           MRN: [id number]             PCP: Aletha Halim., PA-C Referring: Aletha Halim., PA-C Visit Date: 03/22/2017 Occupation: _0 @    Subjective:  Joint stiffness.   History of Present Illness: Linda Berg is a 71 y.o. female with history of sero positive rheumatoid arthritis. She states she had left foot fracture in June. She was under care of Dr. Doran Durand and was in a boot for a while. She still have some discomfort in her foot off and on. Arthritis is same without any joint swelling. She states with prolonged walking and standing she gets some swelling in her ankle joints.  Activities of Daily Living:  Patient reports morning stiffness for 10  minutes.   Patient Denies nocturnal pain.  Difficulty dressing/grooming: Denies Difficulty climbing stairs: Denies Difficulty getting out of chair: Denies Difficulty using hands for taps, buttons, cutlery, and/or writing: Denies   Review of Systems  Constitutional: Positive for fatigue. Negative for night sweats, weight gain, weight loss and weakness.  HENT: Positive for mouth sores and mouth dryness. Negative for trouble swallowing, trouble swallowing and nose dryness.   Eyes: Positive for dryness. Negative for pain, redness and visual disturbance.  Respiratory: Negative for cough, shortness of breath and difficulty breathing.   Cardiovascular: Negative.  Negative for chest pain, palpitations, hypertension, irregular heartbeat and swelling in legs/feet.  Gastrointestinal: Positive for constipation. Negative for blood in stool and diarrhea.  Endocrine: Negative.  Negative for increased urination.  Genitourinary: Negative.  Negative for nocturia and vaginal dryness.  Musculoskeletal: Positive for arthralgias, joint pain, muscle weakness and morning stiffness. Negative for joint swelling, myalgias, muscle tenderness and myalgias.  Skin:  Negative.  Negative for color change, rash, hair loss, skin tightness, ulcers and sensitivity to sunlight.  Allergic/Immunologic: Negative for susceptible to infections.  Neurological: Negative.  Negative for dizziness, headaches, memory loss and night sweats.  Hematological: Negative.  Negative for swollen glands.  Psychiatric/Behavioral: Negative.  Negative for depressed mood and sleep disturbance. The patient is not nervous/anxious.     PMFS History:  Patient Active Problem List   Diagnosis Date Noted  . Trochanteric bursitis of both hips 03/16/2017  . Rheumatoid arthritis involving multiple sites with positive rheumatoid factor ([place]) 10/09/2016  . Sicca syndrome, unspecified ([place]) 10/09/2016  . Osteopenia of multiple sites 10/09/2016  . History of scoliosis 10/09/2016  . History of gastroesophageal reflux (GERD) 10/09/2016  . History of hypothyroidism 10/09/2016  . History of hyperlipidemia 10/09/2016  . High risk medication use 10/09/2016  . Primary osteoarthritis of both hands 10/09/2016  . Paresthesia 10/11/2014  . Hyperlipidemia 10/11/2014  . Small vessel disease, 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/11/2014  . Tinnitus 09/17/2014  . Abdominal pain 09/05/2014  . Acute cystitis 09/05/2014  . Arthritis 09/05/2014  . Cough 09/05/2014  . Esophagitis 09/05/2014  . Facial numbness 09/05/2014  . Difficulty hearing 09/05/2014  . H/O disease 09/05/2014  . HLD (hyperlipidemia) 09/05/2014  . Muscle ache 09/05/2014  . Symptoms involving urinary system 09/05/2014  . Awareness of heartbeats 09/05/2014  . Acne erythematosa 09/05/2014  . Absence of bladder continence 09/05/2014  . Urgency of micturation 09/05/2014  . Routine general medical examination at a health care facility 09/05/2014  . DYSPHAGIA 01/09/2009  . HYPOTHYROIDISM 11/04/2007  . DYSLIPIDEMIA 11/04/2007  .  GERD 11/04/2007  . 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/04/2007  . DUODENITIS, WITHOUT HEMORRHAGE 11/04/2007  . HIATAL HERNIA 11/04/2007  .  DIVERTICULOSIS, COLON 11/04/2007  . CONSTIPATION, CHRONIC 11/04/2007  . RECTAL BLEEDING 11/04/2007    Past Medical History:  Diagnosis Date  . Barrett esophagus   . Diverticulosis   . Dyslipidemia   . Facial numbness   . GERD (gastroesophageal reflux disease)   . Hearing loss   . Hiatal hernia   . Hypothyroidism   . Rheumatoid arteritis     Family History  Problem Relation Age of Onset  . Heart disease Mother   . Stroke Father   . Heart disease Maternal Aunt        aunt x 2  . Irritable bowel syndrome Son   . GER disease Son   . Colon cancer Neg Hx    Past Surgical History:  Procedure Laterality Date  . INTRAUTERINE DEVICE INSERTION    . IUD REMOVAL    . KNEE ARTHROSCOPY  12/25/11   left  . LEFT OOPHORECTOMY  03/13/98  . MANDIBLE SURGERY  9/93   pallete expansion  . TONSILLECTOMY    . TUBAL LIGATION     Social History   Social History Narrative   Live with husband at home.   Left-handed.   1 cup coffee/day     Objective: Vital Signs: BP 119/71   Pulse 76   Resp 14   Ht 5' 1.5" (1.562 m)   Wt 145 lb (65.8 kg)   BMI 26.95 kg/m    Physical Exam  Constitutional: She is oriented to person, place, and time. She appears well-developed and well-nourished.  HENT:  Head: Normocephalic and atraumatic.  Eyes: Conjunctivae and EOM are normal.  Neck: Normal range of motion.  Cardiovascular: Normal rate, regular rhythm, normal heart sounds and intact distal pulses.   Pulmonary/Chest: Effort normal and breath sounds normal.  Abdominal: Soft. Bowel sounds are normal.  Lymphadenopathy:    She has no cervical adenopathy.  Neurological: She is alert and oriented to person, place, and time.  Skin: Skin is warm and dry. Capillary refill takes less than 2 seconds.  Psychiatric: She has a normal mood and affect. Her behavior is normal.  Nursing note and vitals reviewed.    Musculoskeletal Exam: C-spine good range of motion. She is thoracic kyphosis and scoliosis.  Shoulder joints elbow joints wrist joint MCPs PIPs DIPs with good range of motion. No synovitis was noted. She has some thickening of PIP/DIP joints in her hands. Hip joints knee joints ankles MTPs PIPs DIPs with good range of motion with no synovitis.  CDAI Exam: CDAI Homunculus Exam:   Joint Counts:  CDAI Tender Joint count: 0 CDAI Swollen Joint count: 0  Global Assessments:  Patient Global Assessment: 2 Provider Global Assessment: 2  CDAI Calculated Score: 4    Investigation: Findings:  01/08/2016 no Plaquenil Toxicity on eye exam  03/19/2017 CBC normal, CMP normal CBC Latest Ref Rng & Units 03/19/2017 10/15/2016 03/05/2015  WBC 3.4 - 10.8 x10E3/uL 5.9 4.2 4.7  Hemoglobin 11.1 - 15.9 g/dL 13.6 13.1 13.8  Hematocrit 34.0 - 46.6 % 40.0 38.5 40.7  Platelets 150 - 379 x10E3/uL 281 257 228.0   CMP Latest Ref Rng & Units 03/19/2017 10/15/2016 03/05/2015  Glucose 65 - 99 mg/dL 72 87 89  BUN 8 - 27 mg/dL '20 21 20  '$ Creatinine 0.57 - 1.00 mg/dL 0.77 0.76 0.82  Sodium 134 - 144 mmol/L 144 142 140  Potassium 3.5 -  5.2 mmol/L 4.3 3.9 4.8  Chloride 96 - 106 mmol/L 103 100 103  CO2 20 - 29 mmol/L _0 Calcium 8.7 - 10.3 mg/dL 9.4 9.6 9.8  Total Protein 6.0 - 8.5 g/dL 7.0 7.6 7.7  Total Bilirubin 0.0 - 1.2 mg/dL 0.2 <0.2 0.3  Alkaline Phos 39 - 117 IU/L 87 105 75  AST 0 - 40 IU/L _1 ALT 0 - 32 IU/L _2 Imaging: No results found.  Speciality Comments: No specialty comments available.    Procedures:  No procedures performed Allergies: Terbinafine hcl and Prednisone   Assessment / Plan:     Visit Diagnoses: Rheumatoid arthritis involving multiple sites with positive rheumatoid factor , negative anti-CCP: Patient had no active synovitis on examination today she's been tolerating Plaquenil well.  High risk medication use - Plaquenil 200 mg per day . Her most recent labs were normal. She will have labs every 5 months. She will have eye exam on a yearly basis.- Plan:  CBC with Differential/Platelet, CMP14+EGFR  Sicca syndrome, unspecified ([place]): Over-the-counter products were discussed.  Primary osteoarthritis of both hands: Joint protection and muscle strengthening discussed.  Osteopenia of multiple sites: Patient reports that she had a bone density in May she will bring it next visit for review. Use of calcium and vitamin D along with resistive exercise was discussed.  Her other medical problems are listed as follows:  History of gastroesophageal reflux (GERD)/ Barretts Esophogus   History of hyperlipidemia  History of hypothyroidism  History of scoliosis  History of diverticulitis    Orders: Orders Placed This Encounter  Procedures  . CBC with Differential/Platelet  . CMP14+EGFR   No orders of the defined types were placed in this encounter.    Follow-Up Instructions: Return in about 5 months (around 08/22/2017) for Rheumatoid arthritis, Osteoarthritis.   Bo Merino, MD  Note - This record has been created using Editor, commissioning.  Chart creation errors have been sought, but may not always  have been located. Such creation errors do not reflect on  the standard of medical care.

## 2017-03-19 ENCOUNTER — Telehealth: Payer: Self-pay | Admitting: Rheumatology

## 2017-03-19 DIAGNOSIS — Z79899 Other long term (current) drug therapy: Secondary | ICD-10-CM

## 2017-03-19 NOTE — Telephone Encounter (Signed)
Labs released.  

## 2017-03-19 NOTE — Telephone Encounter (Signed)
Patient going to [facility] on [address] today if possible for labs. Please release orders.

## 2017-03-20 LAB — CMP14+EGFR
ALT: 22 IU/L (ref 0–32)
AST: 18 IU/L (ref 0–40)
Albumin/Globulin Ratio: 1.9 (ref 1.2–2.2)
Albumin: 4.6 g/dL (ref 3.5–4.8)
Alkaline Phosphatase: 87 IU/L (ref 39–117)
BUN/Creatinine Ratio: 26 (ref 12–28)
BUN: 20 mg/dL (ref 8–27)
Bilirubin Total: 0.2 mg/dL (ref 0.0–1.2)
CALCIUM: 9.4 mg/dL (ref 8.7–10.3)
CO2: 24 mmol/L (ref 20–29)
CREATININE: 0.77 mg/dL (ref 0.57–1.00)
Chloride: 103 mmol/L (ref 96–106)
GFR, EST AFRICAN AMERICAN: 90 mL/min/{1.73_m2} (ref >59)
GFR, EST NON AFRICAN AMERICAN: 78 mL/min/{1.73_m2} (ref >59)
GLOBULIN, TOTAL: 2.4 g/dL (ref 1.5–4.5)
Glucose: 72 mg/dL (ref 65–99)
Potassium: 4.3 mmol/L (ref 3.5–5.2)
SODIUM: 144 mmol/L (ref 134–144)
TOTAL PROTEIN: 7 g/dL (ref 6.0–8.5)

## 2017-03-20 LAB — CBC WITH DIFFERENTIAL/PLATELET
BASOS: 1 %
Basophils Absolute: 0 10*3/uL (ref 0.0–0.2)
EOS (ABSOLUTE): 0.1 10*3/uL (ref 0.0–0.4)
EOS: 2 %
HEMATOCRIT: 40 % (ref 34.0–46.6)
Hemoglobin: 13.6 g/dL (ref 11.1–15.9)
IMMATURE GRANS (ABS): 0 10*3/uL (ref 0.0–0.1)
IMMATURE GRANULOCYTES: 0 %
LYMPHS: 25 %
Lymphocytes Absolute: 1.5 10*3/uL (ref 0.7–3.1)
MCH: 31.5 pg (ref 26.6–33.0)
MCHC: 34 g/dL (ref 31.5–35.7)
MCV: 93 fL (ref 79–97)
MONOS ABS: 0.5 10*3/uL (ref 0.1–0.9)
Monocytes: 8 %
NEUTROS PCT: 64 %
Neutrophils Absolute: 3.8 10*3/uL (ref 1.4–7.0)
Platelets: 281 10*3/uL (ref 150–379)
RBC: 4.32 x10E6/uL (ref 3.77–5.28)
RDW: 13.3 % (ref 12.3–15.4)
WBC: 5.9 10*3/uL (ref 3.4–10.8)

## 2017-03-20 NOTE — Telephone Encounter (Signed)
WNLs

## 2017-03-22 ENCOUNTER — Encounter: Payer: Self-pay | Admitting: Rheumatology

## 2017-03-22 ENCOUNTER — Ambulatory Visit (INDEPENDENT_AMBULATORY_CARE_PROVIDER_SITE_OTHER): Payer: Medicare Other | Admitting: Rheumatology

## 2017-03-22 VITALS — BP 119/71 | HR 76 | Resp 14 | Ht 61.5 in | Wt 145.0 lb

## 2017-03-22 DIAGNOSIS — M19041 Primary osteoarthritis, right hand: Secondary | ICD-10-CM

## 2017-03-22 DIAGNOSIS — M8589 Other specified disorders of bone density and structure, multiple sites: Secondary | ICD-10-CM

## 2017-03-22 DIAGNOSIS — M3500 Sicca syndrome, unspecified: Secondary | ICD-10-CM | POA: Diagnosis not present

## 2017-03-22 DIAGNOSIS — M0579 Rheumatoid arthritis with rheumatoid factor of multiple sites without organ or systems involvement: Secondary | ICD-10-CM

## 2017-03-22 DIAGNOSIS — Z8719 Personal history of other diseases of the digestive system: Secondary | ICD-10-CM | POA: Diagnosis not present

## 2017-03-22 DIAGNOSIS — Z79899 Other long term (current) drug therapy: Secondary | ICD-10-CM

## 2017-03-22 DIAGNOSIS — M19042 Primary osteoarthritis, left hand: Secondary | ICD-10-CM | POA: Diagnosis not present

## 2017-03-22 DIAGNOSIS — Z8639 Personal history of other endocrine, nutritional and metabolic disease: Secondary | ICD-10-CM | POA: Diagnosis not present

## 2017-03-22 DIAGNOSIS — Z8739 Personal history of other diseases of the musculoskeletal system and connective tissue: Secondary | ICD-10-CM | POA: Diagnosis not present

## 2017-03-22 NOTE — Progress Notes (Signed)
Rheumatology Medication Review by a Pharmacist Does the patient feel that his/her medications are working for him/her?  Yes Has the patient been experiencing any side effects to the medications prescribed?  No Does the patient have any problems obtaining medications?  No  Issues to address at subsequent visits: None   Pharmacist comments:  Linda Berg is a pleasant 71 yo F who presents for follow up of rheumatoid arthritis.  She is currently taking hydroxychloroquine 200 mg daily.  Patient had standing labs on 03/19/17 which were normal.  She will be due for standing labs again in January 2019 and every 5 months.  Most recent hydroxychloroquine eye exam on file is from 01/08/16.  Patient confirms she had a hydroxychloroquine eye exam in May 2018 with Dr. Lorin Berg at [facility].  She confirms it was normal.  She has a copy of the report and will mail it back to [place] (I provided her with addressed envelope).  Patient denies any further questions or concerns regarding her medications at this time.   Linda Berg, Pharm.D., BCPS, CPP Clinical Pharmacist Pager: [phone] Phone: [phone] 03/22/2017 12:29 PM

## 2017-03-22 NOTE — Patient Instructions (Signed)
Standing Labs We placed an order today for your standing lab work.    Please come back and get your standing labs in January 2019 then every 5 months.  We have open lab Monday through Friday from 8:30-11:30 AM and 1:30-4 PM at the office of Dr. Pollyann Savoy.   The office is located at [address], [address], [place], [place] [address] No appointment is necessary.   Labs are drawn by [facility].  You may receive a bill from [facility] for your lab work. If you have any questions regarding directions or hours of operation,  please call [phone].

## 2017-03-25 ENCOUNTER — Telehealth: Payer: Self-pay | Admitting: Radiology

## 2017-03-25 NOTE — Telephone Encounter (Signed)
Copy of DEXA received ordered by PCP -2.2 Osteopenia I have sent for scanning  Patient has also provided normal Plaquenil eye exam, I have sent for scanning

## 2017-03-28 ENCOUNTER — Other Ambulatory Visit: Payer: Self-pay | Admitting: Rheumatology

## 2017-03-29 NOTE — Telephone Encounter (Signed)
03/22/17 last visit  09/01/17 next visit    CBC Latest Ref Rng & Units 03/19/2017 10/15/2016 03/05/2015  WBC 3.4 - 10.8 x10E3/uL 5.9 4.2 4.7  Hemoglobin 11.1 - 15.9 g/dL 99.2 42.6 83.4  Hematocrit 34.0 - 46.6 % 40.0 38.5 40.7  Platelets 150 - 379 x10E3/uL 281 257 228.0   CMP Latest Ref Rng & Units 03/19/2017 10/15/2016 03/05/2015  Glucose 65 - 99 mg/dL 72 87 89  BUN 8 - 27 mg/dL 20 21 20   Creatinine 0.57 - 1.00 mg/dL 1.96 2.22  Sodium 134 - 144 mmol/L 144 142 140  Potassium 3.5 - 5.2 mmol/L 4.3 3.9 4.8  Chloride 96 - 106 mmol/L 103 100 103  CO2 20 - 29 mmol/L 24 25 29   Calcium 8.7 - 10.3 mg/dL 9.4 9.6 9.8  Total Protein 6.0 - 8.5 g/dL 7.0 7.6 7.7  Total Bilirubin 0.0 - 1.2 mg/dL 0.2 9.79 0.3  Alkaline Phos 39 - 117 IU/L 87 105 75  AST 0 - 40 IU/L 18 20 21   ALT 0 - 32 IU/L 22 23 24    last eye exam was May 2017 will call her

## 2017-03-31 NOTE — Telephone Encounter (Addendum)
PLQ Eye exam has been scanned in WNL in 01/2017  Okay to refill per Dr. Corliss Skains

## 2017-05-14 ENCOUNTER — Encounter: Payer: Self-pay | Admitting: Gastroenterology

## 2017-06-17 ENCOUNTER — Other Ambulatory Visit: Payer: Self-pay | Admitting: Rheumatology

## 2017-06-17 NOTE — Telephone Encounter (Signed)
03/22/17 last visit  09/01/17 next visit  Labs: 03/19/17 WNL PLQ Eye Exam: WNL in 01/2017  Okay to refill per Dr. Corliss Skains

## 2017-07-08 ENCOUNTER — Encounter: Payer: Self-pay | Admitting: Physician Assistant

## 2017-07-08 ENCOUNTER — Ambulatory Visit: Payer: Medicare Other | Admitting: Physician Assistant

## 2017-07-08 VITALS — BP 108/62 | HR 66 | Ht 61.5 in | Wt 144.4 lb

## 2017-07-08 DIAGNOSIS — R1319 Other dysphagia: Secondary | ICD-10-CM

## 2017-07-08 DIAGNOSIS — Z1211 Encounter for screening for malignant neoplasm of colon: Secondary | ICD-10-CM | POA: Diagnosis not present

## 2017-07-08 DIAGNOSIS — K5900 Constipation, unspecified: Secondary | ICD-10-CM | POA: Diagnosis not present

## 2017-07-08 DIAGNOSIS — K22719 Barrett's esophagus with dysplasia, unspecified: Secondary | ICD-10-CM

## 2017-07-08 DIAGNOSIS — K219 Gastro-esophageal reflux disease without esophagitis: Secondary | ICD-10-CM | POA: Diagnosis not present

## 2017-07-08 MED ORDER — METOCLOPRAMIDE HCL 10 MG PO TABS: 10.0000 mg | ORAL_TABLET | ORAL | 0 refills | Status: DC

## 2017-07-08 MED ORDER — NA SULFATE-K SULFATE-MG SULF 17.5-3.13-1.6 GM/177ML PO SOLN: 1.0000 | ORAL | 0 refills | Status: DC

## 2017-07-08 NOTE — Progress Notes (Signed)
Chief Complaint: GERD, history of Barrett's esophagus, constipation, abdominal pain  HPI:  Linda Berg 71 year old female with a past medical history of Barrett's esophagus, reflux and others listed below,  who was referred to me by Richmond Campbell., PA-C for a complaint of reflux, history of Barrett's esophagus, constipation and abdominal pain.     Patient's last EGD was completed 05/06/07 by Dr. Marina Goodell for surveillance of Barrett's esophagus. There was a finding of a possible short segment of Barrett's as well as reflux. Pathology showed benign gastroesophageal junction mucosa with mild inflammation. No intestinal metaplasia, dysplasia or malignancy. Colonoscopy was completed the same day as well. Repeat was recommended 10 years.   Patient has most recently followed with Dr. Lavon Paganini, last seen 07/11/15 for history of dyspepsia and reflux. It was also discussed that she had irritable bowel syndrome with constipation alternating with diarrhea. At that time patient was continued on Pepcid and was advised to take a PPI when necessary. Patient was given samples of Linzess 145 mcg daily.   Today, the patient presents to clinic and explains that she has trouble with constipation. Patient tells me that she is using MiraLAX a half a dose per day which tends to help but she "never feels completely empty. The patient tells me that when she takes more than a half dose she will have loose stools. Patient completely avoids taking this medicine if she knows she is going out. Patient also describes abdominal pain related to constipation and sometimes radiates through to her back. Patient tells me sometimes she can have "severe pain", in fact she was in the ER for this over the past year. (cannot find report of this)    Patient does tell me that her reflux is moderately controlled with Prevacid when necessary and Gaviscon. The patient has been trying to "wean myself off the Prevacid", due to the fear of side effects.  Patient tells me that she will have breakthrough heartburn and reflux if she eats the wrong foods.    Patient also discusses dysphagia today telling me that both solids and liquids can seem to get hung on their way down her throat. Typically, they will eventually go down with sips of water and time, but the patient knows not to eat big meals and has also been taking small bites and chewing well.    Patient also reminds me that she is due for a screening colonoscopy.    Patient denies fever, chills, blood in her stool, melena, weight loss, anorexia, nausea, vomiting or symptoms that awaken her at night.    Past Medical History:  Diagnosis Date  . Barrett esophagus   . Diverticulosis   . Dyslipidemia   . Facial numbness   . GERD (gastroesophageal reflux disease)   . Hearing loss   . Hiatal hernia   . Hypothyroidism   . Rheumatoid arteritis     Past Surgical History:  Procedure Laterality Date  . INTRAUTERINE DEVICE INSERTION    . IUD REMOVAL    . KNEE ARTHROSCOPY  12/25/11   left  . LEFT OOPHORECTOMY  03/13/98  . MANDIBLE SURGERY  9/93   pallete expansion  . TONSILLECTOMY    . TUBAL LIGATION      Current Outpatient Medications  Medication Sig Dispense Refill  . AMBULATORY NON FORMULARY MEDICATION ALLERGY SHOTS Once a week    . aspirin 81 MG tablet Take 81 mg by mouth 2 (two) times daily.    Marland Kitchen azelastine (ASTELIN) 137 MCG/SPRAY nasal  spray Place 1 spray into the nose 2 (two) times daily. Use in each nostril as directed    . B Complex-C (SUPER B COMPLEX PO) Take 1 tablet by mouth daily.    . Biotin 5000 MCG TABS Take 1 tablet by mouth 1 day or 1 dose.     . cetirizine (ZYRTEC) 10 MG tablet Take 10 mg by mouth at bedtime.    . Cholecalciferol (D3 ADULT) 1000 UNITS CHEW Chew by mouth.    . Coenzyme Q10 (CO Q 10) 100 MG CAPS Take 1 capsule by mouth 2 (two) times daily.    . cycloSPORINE (RESTASIS) 0.05 % ophthalmic emulsion Place 1 drop into both eyes 2 (two) times daily.    .  famotidine (PEPCID) 20 MG tablet Take 20 mg by mouth daily.     . fluconazole (DIFLUCAN) 200 MG tablet     . hydroxychloroquine (PLAQUENIL) 200 MG tablet TAKE 1 TABLET BY MOUTH  DAILY 90 tablet 0  . Krill Oil 300 MG CAPS Take 1 capsule by mouth daily.    . lansoprazole (PREVACID) 15 MG capsule Take 15 mg by mouth daily at 12 noon.    . lovastatin (MEVACOR) 20 MG tablet Take 20 mg by mouth daily.    . Magnesium 250 MG TABS Take 1 tablet by mouth 2 (two) times daily.    . Probiotic Product (PROBIOTIC DAILY PO) Take 1 tablet by mouth daily.     Marland Kitchen thyroid (ARMOUR) 60 MG tablet Take 60 mg by mouth daily before breakfast.    . vitamin C (ASCORBIC ACID) 500 MG tablet Take 500 mg by mouth 2 (two) times daily.    . Zinc 50 MG CAPS Take by mouth.     No current facility-administered medications for this visit.     Allergies as of 07/08/2017 - Review Complete 03/22/2017  Allergen Reaction Noted  . Terbinafine Rash 09/16/2013  . Prednisone Other (See Comments) 01/08/2009    Family History  Problem Relation Age of Onset  . Heart disease Mother   . Stroke Father   . Heart disease Maternal Aunt        aunt x 2  . Irritable bowel syndrome Son   . GER disease Son   . Colon cancer Neg Hx     Social History   Socioeconomic History  . Marital status: Married    Spouse name: Not on file  . Number of children: 2  . Years of education: 66  . Highest education level: Not on file  Social Needs  . Financial resource strain: Not on file  . Food insecurity - worry: Not on file  . Food insecurity - inability: Not on file  . Transportation needs - medical: Not on file  . Transportation needs - non-medical: Not on file  Occupational History  . Occupation: retired    Associate Professor: RETIRED  Tobacco Use  . Smoking status: Never Smoker  . Smokeless tobacco: Never Used  Substance and Sexual Activity  . Alcohol use: No    Alcohol/week: 0.0 oz  . Drug use: No  . Sexual activity: Not on file  Other  Topics Concern  . Not on file  Social History Narrative   Live with husband at home.   Left-handed.   1 cup coffee/day    Review of Systems:    Constitutional: No weight loss, fever or chills Skin: No rash  Cardiovascular: No chest pain Respiratory: No SOB Gastrointestinal: See HPI and otherwise negative Genitourinary: No  dysuria  Neurological: No headache Musculoskeletal: No new muscle or joint pain Hematologic: No bleeding or bruising Psychiatric: No history of depression or anxiety    Physical Exam:  Vital signs: BP 108/62   Pulse 66   Ht 5' 1.5" (1.562 m)   Wt 144 lb 6 oz (65.5 kg)   BMI 26.84 kg/m   Constitutional:   Pleasant Elderly Caucasian female appears to be in NAD, Well developed, Well nourished, alert and cooperative Head:  Normocephalic and atraumatic. Eyes:   PEERL, EOMI. No icterus. Conjunctiva pink. Ears:  Normal auditory acuity. Neck:  Supple Throat: Oral cavity and pharynx without inflammation, swelling or lesion.  Respiratory: Respirations even and unlabored. Lungs clear to auscultation bilaterally.   No wheezes, crackles, or rhonchi.  Cardiovascular: Normal S1, S2. No MRG. Regular rate and rhythm. No peripheral edema, cyanosis or pallor.  Gastrointestinal:  Soft, mild distension, nontender. No rebound or guarding. Normal bowel sounds. No appreciable masses or hepatomegaly. Rectal:  Not performed.  Msk:  Symmetrical without gross deformities. Without edema, no deformity or joint abnormality.  Neurologic:  Alert and  oriented x4;  grossly normal neurologically.  Skin:   Dry and intact without significant lesions or rashes. Psychiatric: Demonstrates good judgement and reason without abnormal affect or behaviors.  MOST RECENT LABS AND IMAGING: CBC    Component Value Date/Time   WBC 5.9 03/19/2017 1345   WBC 4.7 03/05/2015 1711   RBC 4.32 03/19/2017 1345   RBC 4.33 03/05/2015 1711   HGB 13.6 03/19/2017 1345   HCT 40.0 03/19/2017 1345   PLT 281  03/19/2017 1345   MCV 93 03/19/2017 1345   MCH 31.5 03/19/2017 1345   MCHC 34.0 03/19/2017 1345   MCHC 33.8 03/05/2015 1711   RDW 13.3 03/19/2017 1345   LYMPHSABS 1.5 03/19/2017 1345   MONOABS 0.5 03/05/2015 1711   EOSABS 0.1 03/19/2017 1345   BASOSABS 0.0 03/19/2017 1345    CMP     Component Value Date/Time   NA 144 03/19/2017 1345   K 4.3 03/19/2017 1345   CL 103 03/19/2017 1345   CO2 24 03/19/2017 1345   GLUCOSE 72 03/19/2017 1345   GLUCOSE 89 03/05/2015 1711   BUN 20 03/19/2017 1345   CREATININE 0.77 03/19/2017 1345   CALCIUM 9.4 03/19/2017 1345   PROT 7.0 03/19/2017 1345   ALBUMIN 4.6 03/19/2017 1345   AST 18 03/19/2017 1345   ALT 22 03/19/2017 1345   ALKPHOS 87 03/19/2017 1345   BILITOT 0.2 03/19/2017 1345   GFRNONAA 78 03/19/2017 1345   GFRAA 90 03/19/2017 1345    Assessment: 1. Screening colonoscopy: Last colonoscopy in 2008 was normal, repeat was recommended in 10 years. 2. Constipation: Worsening per the patient, currently on MiraLAX half dose daily which does help 3. History of Barrett's esophagus: No sign of this at time of last EGD in 2008 4. Dysphagia: Increased symptoms to liquids and solids lately; consider most likely esophageal stricture versus ring versus web versus mass versus other 5. GERD: Moderately controlled on when necessary Prevacid 15 mg and Gaviscon  Plan: 1. Patient is due for a screening colonoscopy as her last was done 10 years ago. Scheduled patient for colonoscopy today. Did discuss risks, benefits, limitations and alternatives and the patient agrees to proceed. 2. Also scheduled patient for an EGD with dilation due to complaint of dysphagia and reflux and history of Barrett's esophagus. Discussed risks and benefits as above. 3. Would recommend the patient continue Prevacid 15 mg and Gaviscon  when necessary as she is currently doing. Patient is worried regarding side effects. We did discuss these medications may be increased after time of  EGD pending findings. Did discuss that the use of PPI in patients with Barrett's esophagus can help lower risk of cancer. 4. Reviewed anti-dysphagia measures including taking small bites, chewing well and avoiding distraction while eating 5. Discussed with patient that she can continue MiraLAX half a dose daily for now. It sounds as though a higher doses too strong for her and Linzess or Amitiza would likely be too strong as well, though could consider low-dose Linzess [place] in the future 6. Patient to follow in clinic per recommendation from Dr. Lavon Paganini after time of procedures  Hyacinth Meeker, PA-C [facility] 07/08/2017, 1:45 PM  Cc: Richmond Campbell., PA-C

## 2017-07-08 NOTE — Patient Instructions (Signed)
Take Reglan 10 mg 30 minutes before each dose of prep.    Continue Miralax 1/2 dose daily   Continue Prevacid and Gaviscon as prescribed/discussed.  You have been scheduled for an endoscopy and colonoscopy. Please follow the written instructions given to you at your visit today. Please pick up your prep supplies at the pharmacy within the next 1-3 days. If you use inhalers (even only as needed), please bring them with you on the day of your procedure. Your physician has requested that you go to www.startemmi.com and enter the access code given to you at your visit today. This web site gives a general overview about your procedure. However, you should still follow specific instructions given to you by our office regarding your preparation for the procedure.

## 2017-07-08 NOTE — Progress Notes (Signed)
Reviewed and agree with documentation and assessment and plan. K. Veena Enes Rokosz , MD   

## 2017-08-11 ENCOUNTER — Encounter: Payer: Self-pay | Admitting: Gastroenterology

## 2017-08-11 NOTE — Progress Notes (Deleted)
Office Visit Note  Patient: Linda Berg             Date of Birth: 06/06/1946           MRN: [id number]             PCP: Richmond Campbell., PA-C Referring: Richmond Campbell., PA-C Visit Date: 08/25/2017 Occupation: @GUAROCC @    Subjective:  No chief complaint on file.   History of Present Illness: Linda Berg is a 72 y.o. female ***   Activities of Daily Living:  Patient reports morning stiffness for *** {minute/hour:19697}.   Patient {ACTIONS;DENIES/REPORTS:21021675::"Denies"} nocturnal pain.  Difficulty dressing/grooming: {ACTIONS;DENIES/REPORTS:21021675::"Denies"} Difficulty climbing stairs: {ACTIONS;DENIES/REPORTS:21021675::"Denies"} Difficulty getting out of chair: {ACTIONS;DENIES/REPORTS:21021675::"Denies"} Difficulty using hands for taps, buttons, cutlery, and/or writing: {ACTIONS;DENIES/REPORTS:21021675::"Denies"}   No Rheumatology ROS completed.   PMFS History:  Patient Active Problem List   Diagnosis Date Noted  . Trochanteric bursitis of both hips 03/16/2017  . Rheumatoid arthritis involving multiple sites with positive rheumatoid factor (HCC) 10/09/2016  . Sicca syndrome, unspecified (HCC) 10/09/2016  . Osteopenia of multiple sites 10/09/2016  . History of scoliosis 10/09/2016  . History of gastroesophageal reflux (GERD) 10/09/2016  . History of hypothyroidism 10/09/2016  . History of hyperlipidemia 10/09/2016  . High risk medication use 10/09/2016  . Primary osteoarthritis of both hands 10/09/2016  . Paresthesia 10/11/2014  . Hyperlipidemia 10/11/2014  . Small vessel disease, 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/11/2014  . Tinnitus 09/17/2014  . Abdominal pain 09/05/2014  . Acute cystitis 09/05/2014  . Arthritis 09/05/2014  . Cough 09/05/2014  . Esophagitis 09/05/2014  . Facial numbness 09/05/2014  . Difficulty hearing 09/05/2014  . H/O disease 09/05/2014  . HLD (hyperlipidemia) 09/05/2014  . Muscle ache 09/05/2014  . Symptoms involving urinary system  09/05/2014  . Awareness of heartbeats 09/05/2014  . Acne erythematosa 09/05/2014  . Absence of bladder continence 09/05/2014  . Urgency of micturation 09/05/2014  . Routine general medical examination at a health care facility 09/05/2014  . DYSPHAGIA 01/09/2009  . HYPOTHYROIDISM 11/04/2007  . DYSLIPIDEMIA 11/04/2007  . GERD 11/04/2007  . 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/04/2007  . DUODENITIS, WITHOUT HEMORRHAGE 11/04/2007  . HIATAL HERNIA 11/04/2007  . DIVERTICULOSIS, COLON 11/04/2007  . CONSTIPATION, CHRONIC 11/04/2007  . RECTAL BLEEDING 11/04/2007    Past Medical History:  Diagnosis Date  . Barrett esophagus   . Diverticulosis   . Dyslipidemia   . Facial numbness   . GERD (gastroesophageal reflux disease)   . Hearing loss   . Hiatal hernia   . Hypothyroidism   . Rheumatoid arteritis     Family History  Problem Relation Age of Onset  . Heart disease Mother   . Stroke Father   . Heart disease Maternal Aunt        aunt x 2  . Irritable bowel syndrome Son   . GER disease Son   . Colon cancer Neg Hx    Past Surgical History:  Procedure Laterality Date  . INTRAUTERINE DEVICE INSERTION    . IUD REMOVAL    . KNEE ARTHROSCOPY  12/25/11   left  . LEFT OOPHORECTOMY  03/13/98  . MANDIBLE SURGERY  9/93   pallete expansion  . TONSILLECTOMY    . TUBAL LIGATION     Social History   Social History Narrative   Live with husband at home.   Left-handed.   1 cup coffee/day     Objective: Vital Signs: There were no vitals taken for this visit.   Physical Exam  Musculoskeletal Exam: ***  CDAI Exam: No CDAI exam completed.    Investigation: No additional findings.PLQ eye exam: 02/03/2017 CBC Latest Ref Rng & Units 03/19/2017 10/15/2016 03/05/2015  WBC 3.4 - 10.8 x10E3/uL 5.9 4.2 4.7  Hemoglobin 11.1 - 15.9 g/dL 68.3 41.9 62.2  Hematocrit 34.0 - 46.6 % 40.0 38.5 40.7  Platelets 150 - 379 x10E3/uL 281 257 228.0   CMP Latest Ref Rng & Units 03/19/2017 10/15/2016 03/05/2015  Glucose  65 - 99 mg/dL 72 87 89  BUN 8 - 27 mg/dL 20 21 20   Creatinine 0.57 - 1.00 mg/dL 2.97 9.89  Sodium 134 - 144 mmol/L 144 142 140  Potassium 3.5 - 5.2 mmol/L 4.3 3.9 4.8  Chloride 96 - 106 mmol/L 103 100 103  CO2 20 - 29 mmol/L 24 25 29   Calcium 8.7 - 10.3 mg/dL 9.4 9.6 9.8  Total Protein 6.0 - 8.5 g/dL 7.0 7.6 7.7  Total Bilirubin 0.0 - 1.2 mg/dL 0.2 2.11 0.3  Alkaline Phos 39 - 117 IU/L 87 105 75  AST 0 - 40 IU/L 18 20 21   ALT 0 - 32 IU/L 22 23 24     Imaging: No results found.  Speciality Comments: No specialty comments available.    Procedures:  No procedures performed Allergies: Terbinafine and Prednisone   Assessment / Plan:     Visit Diagnoses: No diagnosis found.    Orders: No orders of the defined types were placed in this encounter.  No orders of the defined types were placed in this encounter.   Face-to-face time spent with patient was *** minutes. 50% of time was spent in counseling and coordination of care.  Follow-Up Instructions: No Follow-up on file.   , CMA  Note - This record has been created using <9.4.  Chart creation errors have been sought, but may not always  have been located. Such creation errors do not reflect on  the standard of medical care.

## 2017-08-17 ENCOUNTER — Emergency Department (HOSPITAL_COMMUNITY): Payer: Medicare Other

## 2017-08-17 ENCOUNTER — Ambulatory Visit (AMBULATORY_SURGERY_CENTER): Payer: Medicare Other | Admitting: Gastroenterology

## 2017-08-17 ENCOUNTER — Other Ambulatory Visit: Payer: Self-pay

## 2017-08-17 ENCOUNTER — Encounter: Payer: Self-pay | Admitting: Gastroenterology

## 2017-08-17 ENCOUNTER — Inpatient Hospital Stay (HOSPITAL_COMMUNITY): Payer: Medicare Other

## 2017-08-17 ENCOUNTER — Inpatient Hospital Stay (HOSPITAL_COMMUNITY)
Admission: EM | Admit: 2017-08-17 | Discharge: 2017-08-21 | DRG: 915 | Disposition: A | Discharge status: Home / Self Care | Payer: Medicare Other | Attending: Internal Medicine | Admitting: Internal Medicine

## 2017-08-17 VITALS — BP 97/58 | HR 130 | Temp 97.8°F | Resp 18 | Ht 61.5 in | Wt 144.0 lb

## 2017-08-17 DIAGNOSIS — T797XXA Traumatic subcutaneous emphysema, initial encounter: Secondary | ICD-10-CM | POA: Diagnosis present

## 2017-08-17 DIAGNOSIS — E872 Acidosis: Secondary | ICD-10-CM | POA: Diagnosis present

## 2017-08-17 DIAGNOSIS — K229 Disease of esophagus, unspecified: Secondary | ICD-10-CM | POA: Diagnosis not present

## 2017-08-17 DIAGNOSIS — J982 Interstitial emphysema: Secondary | ICD-10-CM

## 2017-08-17 DIAGNOSIS — Z1212 Encounter for screening for malignant neoplasm of rectum: Secondary | ICD-10-CM | POA: Diagnosis not present

## 2017-08-17 DIAGNOSIS — K219 Gastro-esophageal reflux disease without esophagitis: Secondary | ICD-10-CM | POA: Diagnosis present

## 2017-08-17 DIAGNOSIS — T882XXA Shock due to anesthesia, initial encounter: Secondary | ICD-10-CM | POA: Diagnosis not present

## 2017-08-17 DIAGNOSIS — Z4682 Encounter for fitting and adjustment of non-vascular catheter: Secondary | ICD-10-CM

## 2017-08-17 DIAGNOSIS — Y9289 Other specified places as the place of occurrence of the external cause: Secondary | ICD-10-CM | POA: Diagnosis not present

## 2017-08-17 DIAGNOSIS — J9600 Acute respiratory failure, unspecified whether with hypoxia or hypercapnia: Secondary | ICD-10-CM | POA: Diagnosis present

## 2017-08-17 DIAGNOSIS — J9601 Acute respiratory failure with hypoxia: Secondary | ICD-10-CM | POA: Diagnosis not present

## 2017-08-17 DIAGNOSIS — I959 Hypotension, unspecified: Secondary | ICD-10-CM | POA: Diagnosis not present

## 2017-08-17 DIAGNOSIS — Z8719 Personal history of other diseases of the digestive system: Secondary | ICD-10-CM

## 2017-08-17 DIAGNOSIS — E785 Hyperlipidemia, unspecified: Secondary | ICD-10-CM | POA: Diagnosis present

## 2017-08-17 DIAGNOSIS — T411X5A Adverse effect of intravenous anesthetics, initial encounter: Secondary | ICD-10-CM | POA: Diagnosis present

## 2017-08-17 DIAGNOSIS — R1310 Dysphagia, unspecified: Secondary | ICD-10-CM | POA: Diagnosis not present

## 2017-08-17 DIAGNOSIS — K449 Diaphragmatic hernia without obstruction or gangrene: Secondary | ICD-10-CM | POA: Diagnosis present

## 2017-08-17 DIAGNOSIS — R1319 Other dysphagia: Secondary | ICD-10-CM

## 2017-08-17 DIAGNOSIS — M069 Rheumatoid arthritis, unspecified: Secondary | ICD-10-CM | POA: Diagnosis present

## 2017-08-17 DIAGNOSIS — J95811 Postprocedural pneumothorax: Secondary | ICD-10-CM | POA: Diagnosis present

## 2017-08-17 DIAGNOSIS — Z1211 Encounter for screening for malignant neoplasm of colon: Secondary | ICD-10-CM | POA: Diagnosis present

## 2017-08-17 DIAGNOSIS — S270XXA Traumatic pneumothorax, initial encounter: Secondary | ICD-10-CM

## 2017-08-17 DIAGNOSIS — T782XXA Anaphylactic shock, unspecified, initial encounter: Secondary | ICD-10-CM

## 2017-08-17 DIAGNOSIS — Y848 Other medical procedures as the cause of abnormal reaction of the patient, or of later complication, without mention of misadventure at the time of the procedure: Secondary | ICD-10-CM | POA: Diagnosis present

## 2017-08-17 DIAGNOSIS — T8182XA Emphysema (subcutaneous) resulting from a procedure, initial encounter: Secondary | ICD-10-CM | POA: Diagnosis not present

## 2017-08-17 DIAGNOSIS — K668 Other specified disorders of peritoneum: Secondary | ICD-10-CM | POA: Diagnosis not present

## 2017-08-17 DIAGNOSIS — Z888 Allergy status to other drugs, medicaments and biological substances status: Secondary | ICD-10-CM

## 2017-08-17 DIAGNOSIS — J930 Spontaneous tension pneumothorax: Secondary | ICD-10-CM | POA: Diagnosis not present

## 2017-08-17 DIAGNOSIS — T886XXA Anaphylactic reaction due to adverse effect of correct drug or medicament properly administered, initial encounter: Secondary | ICD-10-CM | POA: Diagnosis present

## 2017-08-17 DIAGNOSIS — E039 Hypothyroidism, unspecified: Secondary | ICD-10-CM | POA: Diagnosis present

## 2017-08-17 DIAGNOSIS — Z978 Presence of other specified devices: Secondary | ICD-10-CM

## 2017-08-17 HISTORY — PX: CHEST TUBE INSERTION: SHX231

## 2017-08-17 HISTORY — PX: COLONOSCOPY: SHX174

## 2017-08-17 LAB — COMPREHENSIVE METABOLIC PANEL
ALT: 25 U/L (ref 14–54)
ANION GAP: 7 (ref 5–15)
AST: 27 U/L (ref 15–41)
Albumin: 3.5 g/dL (ref 3.5–5.0)
Alkaline Phosphatase: 75 U/L (ref 38–126)
BUN: 9 mg/dL (ref 6–20)
CHLORIDE: 112 mmol/L — AB (ref 101–111)
CO2: 22 mmol/L (ref 22–32)
Calcium: 7.7 mg/dL — ABNORMAL LOW (ref 8.9–10.3)
Creatinine, Ser: 0.71 mg/dL (ref 0.44–1.00)
GFR calc Af Amer: >60 mL/min (ref >60)
Glucose, Bld: 127 mg/dL — ABNORMAL HIGH (ref 65–99)
POTASSIUM: 3.4 mmol/L — AB (ref 3.5–5.1)
SODIUM: 141 mmol/L (ref 135–145)
Total Bilirubin: 0.5 mg/dL (ref 0.3–1.2)
Total Protein: 5.8 g/dL — ABNORMAL LOW (ref 6.5–8.1)

## 2017-08-17 LAB — CBC WITH DIFFERENTIAL/PLATELET
Basophils Absolute: 0 10*3/uL (ref 0.0–0.1)
Basophils Relative: 0 %
Eosinophils Absolute: 0.1 10*3/uL (ref 0.0–0.7)
Eosinophils Relative: 1 %
HEMATOCRIT: 36.4 % (ref 36.0–46.0)
HEMOGLOBIN: 11.5 g/dL — AB (ref 12.0–15.0)
LYMPHS ABS: 1.1 10*3/uL (ref 0.7–4.0)
LYMPHS PCT: 13 %
MCH: 29.8 pg (ref 26.0–34.0)
MCHC: 31.6 g/dL (ref 30.0–36.0)
MCV: 94.3 fL (ref 78.0–100.0)
MONOS PCT: 3 %
Monocytes Absolute: 0.3 10*3/uL (ref 0.1–1.0)
NEUTROS ABS: 7.1 10*3/uL (ref 1.7–7.7)
Neutrophils Relative %: 83 %
Platelets: 203 10*3/uL (ref 150–400)
RBC: 3.86 MIL/uL — AB (ref 3.87–5.11)
RDW: 13.7 % (ref 11.5–15.5)
WBC: 8.6 10*3/uL (ref 4.0–10.5)

## 2017-08-17 LAB — I-STAT ARTERIAL BLOOD GAS, ED
ACID-BASE DEFICIT: 5 mmol/L — AB (ref 0.0–2.0)
Bicarbonate: 20.1 mmol/L (ref 20.0–28.0)
O2 SAT: 99 %
PCO2 ART: 34.8 mmHg (ref 32.0–48.0)
PH ART: 7.362 (ref 7.350–7.450)
TCO2: 21 mmol/L — ABNORMAL LOW (ref 22–32)
pO2, Arterial: 150 mmHg — ABNORMAL HIGH (ref 83.0–108.0)

## 2017-08-17 LAB — POCT I-STAT 3, ART BLOOD GAS (G3+)
ACID-BASE DEFICIT: 6 mmol/L — AB (ref 0.0–2.0)
Bicarbonate: 20.2 mmol/L (ref 20.0–28.0)
O2 Saturation: 96 %
PH ART: 7.282 — AB (ref 7.350–7.450)
TCO2: 21 mmol/L — ABNORMAL LOW (ref 22–32)
pCO2 arterial: 42.7 mmHg (ref 32.0–48.0)
pO2, Arterial: 94 mmHg (ref 83.0–108.0)

## 2017-08-17 LAB — GLUCOSE, CAPILLARY: GLUCOSE-CAPILLARY: 125 mg/dL — AB (ref 65–99)

## 2017-08-17 MED ORDER — FENTANYL CITRATE (PF) 100 MCG/2ML IJ SOLN
INTRAMUSCULAR | Status: AC
  Filled 2017-08-17: qty 2

## 2017-08-17 MED ORDER — IOPAMIDOL (ISOVUE-300) INJECTION 61%
INTRAVENOUS | Status: AC
  Administered 2017-08-17: 100 mL
  Filled 2017-08-17: qty 100

## 2017-08-17 MED ORDER — ASPIRIN 81 MG PO CHEW
324.0000 mg | CHEWABLE_TABLET | ORAL | Status: AC
  Administered 2017-08-18: 324 mg via ORAL
  Filled 2017-08-17: qty 4

## 2017-08-17 MED ORDER — FENTANYL CITRATE (PF) 100 MCG/2ML IJ SOLN
50.0000 ug | INTRAMUSCULAR | Status: DC | PRN
  Administered 2017-08-17 – 2017-08-18 (×2): 50 ug via INTRAVENOUS
  Filled 2017-08-17: qty 2

## 2017-08-17 MED ORDER — SODIUM CHLORIDE 0.9 % IV BOLUS (SEPSIS)
1000.0000 mL | Freq: Once | INTRAVENOUS | Status: AC
  Administered 2017-08-17: 1000 mL via INTRAVENOUS

## 2017-08-17 MED ORDER — FENTANYL 2500MCG IN NS 250ML (10MCG/ML) PREMIX INFUSION
0.0000 ug/h | INTRAVENOUS | Status: DC
  Administered 2017-08-17: 50 ug/h via INTRAVENOUS
  Filled 2017-08-17: qty 250

## 2017-08-17 MED ORDER — SODIUM CHLORIDE 0.9 % IV SOLN
INTRAVENOUS | Status: DC
Start: 2017-08-18 — End: 2017-08-19
  Administered 2017-08-18 (×2): via INTRAVENOUS

## 2017-08-17 MED ORDER — SODIUM CHLORIDE 0.9 % IV SOLN: 500.0000 mL | Freq: Once | INTRAVENOUS | Status: DC

## 2017-08-17 MED ORDER — MIDAZOLAM HCL 2 MG/2ML IJ SOLN: 1.0000 mg | INTRAMUSCULAR | Status: DC | PRN

## 2017-08-17 MED ORDER — LIDOCAINE-EPINEPHRINE (PF) 2 %-1:200000 IJ SOLN
20.0000 mL | Freq: Once | INTRAMUSCULAR | Status: AC
  Administered 2017-08-17: 20 mL via INTRADERMAL
  Filled 2017-08-17: qty 20

## 2017-08-17 MED ORDER — MIDAZOLAM HCL 2 MG/2ML IJ SOLN
INTRAMUSCULAR | Status: AC
  Administered 2017-08-18: 2 mg via INTRAVENOUS
  Filled 2017-08-17: qty 4

## 2017-08-17 MED ORDER — FENTANYL CITRATE (PF) 100 MCG/2ML IJ SOLN
50.0000 ug | INTRAMUSCULAR | Status: AC | PRN
  Administered 2017-08-17 (×3): 50 ug via INTRAVENOUS

## 2017-08-17 MED ORDER — PIPERACILLIN-TAZOBACTAM 3.375 G IVPB 30 MIN
3.3750 g | Freq: Once | INTRAVENOUS | Status: AC
  Administered 2017-08-17: 3.375 g via INTRAVENOUS
  Filled 2017-08-17: qty 50

## 2017-08-17 MED ORDER — MIDAZOLAM HCL 2 MG/2ML IJ SOLN
INTRAMUSCULAR | Status: AC
  Administered 2017-08-17: 1 mg via INTRAVENOUS
  Filled 2017-08-17: qty 2

## 2017-08-17 MED ORDER — HEPARIN SODIUM (PORCINE) 5000 UNIT/ML IJ SOLN
5000.0000 [IU] | Freq: Three times a day (TID) | INTRAMUSCULAR | Status: DC
  Administered 2017-08-18 – 2017-08-21 (×11): 5000 [IU] via SUBCUTANEOUS
  Filled 2017-08-17 (×12): qty 1

## 2017-08-17 MED ORDER — ASPIRIN 300 MG RE SUPP: 300.0000 mg | RECTAL | Status: AC

## 2017-08-17 MED ORDER — PANTOPRAZOLE SODIUM 40 MG IV SOLR
40.0000 mg | Freq: Every day | INTRAVENOUS | Status: DC
  Administered 2017-08-18: 40 mg via INTRAVENOUS

## 2017-08-17 MED ORDER — SODIUM CHLORIDE 0.9 % IV SOLN: 250.0000 mL | INTRAVENOUS | Status: DC | PRN

## 2017-08-17 MED ORDER — PIPERACILLIN-TAZOBACTAM 3.375 G IVPB
3.3750 g | Freq: Three times a day (TID) | INTRAVENOUS | Status: DC
  Administered 2017-08-18: 3.375 g via INTRAVENOUS
  Filled 2017-08-17 (×2): qty 50

## 2017-08-17 MED ORDER — MIDAZOLAM HCL 2 MG/2ML IJ SOLN
1.0000 mg | INTRAMUSCULAR | Status: DC | PRN
  Administered 2017-08-17 (×2): 1 mg via INTRAVENOUS
  Filled 2017-08-17: qty 2

## 2017-08-17 NOTE — ED Notes (Signed)
Per husband pt's current state is significantly swollen in comparison to her baseline, and pt has hypotension usually systolic in the 80's at baseline as well

## 2017-08-17 NOTE — Progress Notes (Signed)
Pt intubated prior to arrival.  ET tube taped and secured.  Equal BBS.  RT will continue to monitor.

## 2017-08-17 NOTE — Progress Notes (Signed)
Called to room to assist during endoscopic procedure.  Patient ID and intended procedure confirmed with present staff. Received instructions for my participation in the procedure from the performing physician.  

## 2017-08-17 NOTE — Progress Notes (Signed)
RT transported patient from ED to 2M without any complications. 

## 2017-08-17 NOTE — Op Note (Addendum)
[facility] Patient Name: Linda Berg Procedure Date: 08/17/2017 2:38 PM MRN: [id number] Endoscopist: Napoleon Form , MD Age: 72 Referring MD:  Date of Birth: 1945-08-20 Gender: Female Account #: [id number] Procedure:                Upper GI endoscopy Indications:              Dysphagia Medicines:                Monitored Anesthesia Care Procedure:                Pre-Anesthesia Assessment:                           - Prior to the procedure, a History and Physical                            was performed, and patient medications and                            allergies were reviewed. The patient's tolerance of                            previous anesthesia was also reviewed. The risks                            and benefits of the procedure and the sedation                            options and risks were discussed with the patient.                            All questions were answered, and informed consent                            was obtained. Prior Anticoagulants: The patient has                            taken no previous anticoagulant or antiplatelet                            agents. ASA Grade Assessment: II - A patient with                            mild systemic disease. After reviewing the risks                            and benefits, the patient was deemed in                            satisfactory condition to undergo the procedure.                           - Prior to the procedure, a History and Physical  was performed, and patient medications and                            allergies were reviewed. The patient's tolerance of                            previous anesthesia was also reviewed. The risks                            and benefits of the procedure and the sedation                            options and risks were discussed with the patient.                            All questions were answered, and informed  consent                            was obtained. Prior Anticoagulants: The patient has                            taken no previous anticoagulant or antiplatelet                            agents. ASA Grade Assessment: II - A patient with                            mild systemic disease. After reviewing the risks                            and benefits, the patient was deemed in                            satisfactory condition to undergo the procedure.                           After obtaining informed consent, the endoscope was                            passed under direct vision. Throughout the                            procedure, the patient's blood pressure, pulse, and                            oxygen saturations were monitored continuously. The                            Model GIF-HQ190 [id number]) scope was introduced                            through the mouth, and advanced to the second part  of duodenum. The upper GI endoscopy was                            accomplished without difficulty. The patient                            tolerated the procedure well. Scope In: Scope Out: Findings:                 The Z-line was irregular and was found 38 cm from                            the incisors. Island of salmon pink mucosa was                            noted. Biopsies were taken with a cold forceps for                            histology.                           No endoscopic abnormality was evident in the                            esophagus to explain the patient's complaint of                            dysphagia.                           The stomach was normal.                           The examined duodenum was normal. Complications:            No immediate complications. Estimated Blood Loss:     Estimated blood loss was minimal. Impression:               - Z-line irregular, 38 cm from the incisors.                            Biopsied.                            - No endoscopic esophageal abnormality to explain                            patient's dysphagia.                           - Normal stomach.                           - Normal examined duodenum. Recommendation:           - See the colonoscopy procedure note for  documentation of additional recommendations.                           - NPO.                           - Continue present medications. Napoleon Form, MD 08/17/2017 4:51:34 PM This report has been signed electronically.

## 2017-08-17 NOTE — Consult Note (Signed)
Reason for Consult: pneumoperitoneum Referring Physician: [name] is an 72 y.o. female.  HPI: 72 yo female underwent upper endoscopy for dysphagia and screening colonoscopy with finding of mucosal tear repaired with multiple clips. Total scope time for colonoscopy was 8mn. Afterwards she had tongue and then throat swelling and was intubated and found to have anaphylaxis.  Past Medical History:  Diagnosis Date  . Barrett esophagus   . Diverticulosis   . Dyslipidemia   . Facial numbness   . GERD (gastroesophageal reflux disease)   . Hearing loss   . Hiatal hernia   . Hypothyroidism   . Rheumatoid arteritis     Past Surgical History:  Procedure Laterality Date  . INTRAUTERINE DEVICE INSERTION    . IUD REMOVAL    . KNEE ARTHROSCOPY  12/25/11   left  . LEFT OOPHORECTOMY  03/13/98  . MANDIBLE SURGERY  9/93   pallete expansion  . TONSILLECTOMY    . TUBAL LIGATION      Family History  Problem Relation Age of Onset  . Heart disease Mother   . Stroke Father   . Heart disease Maternal Aunt        aunt x 2  . Irritable bowel syndrome Son   . GER disease Son   . Colon cancer Neg Hx     Social History:  reports that  has never smoked. she has never used smokeless tobacco. She reports that she does not drink alcohol or use drugs.  Allergies:  Allergies  Allergen Reactions  . Ciprofloxacin Anaphylaxis    Facial numbness  . Propofol Anaphylaxis  . Terbinafine Rash  . Prednisone Other (See Comments)    Stomach spasms    Medications: I have reviewed the patient's current medications.  Results for orders placed or performed during the hospital encounter of 08/17/17 (from the past 48 hour(s))  I-Stat arterial blood gas, ED     Status: Abnormal   Collection Time: 08/17/17  5:40 PM  Result Value Ref Range   pH, Arterial 7.362 7.350 - 7.450   pCO2 arterial 34.8 32.0 - 48.0 mmHg   pO2, Arterial 150.0 (H) 83.0 - 108.0 mmHg   Bicarbonate 20.1 20.0 - 28.0 mmol/L    TCO2 21 (L) 22 - 32 mmol/L   O2 Saturation 99.0 %   Acid-base deficit 5.0 (H) 0.0 - 2.0 mmol/L   Patient temperature 95.5 F    Collection site RADIAL, ALLEN'S TEST ACCEPTABLE    Drawn by RT    Sample type ARTERIAL   Comprehensive metabolic panel     Status: Abnormal   Collection Time: 08/17/17  6:15 PM  Result Value Ref Range   Sodium 141 135 - 145 mmol/L   Potassium 3.4 (L) 3.5 - 5.1 mmol/L   Chloride 112 (H) 101 - 111 mmol/L   CO2 22 22 - 32 mmol/L   Glucose, Bld 127 (H) 65 - 99 mg/dL   BUN 9 6 - 20 mg/dL   Creatinine, Ser 0.71 0.44 - 1.00 mg/dL   Calcium 7.7 (L) 8.9 - 10.3 mg/dL   Total Protein 5.8 (L) 6.5 - 8.1 g/dL   Albumin 3.5 3.5 - 5.0 g/dL   AST 27 15 - 41 U/L   ALT 25 14 - 54 U/L   Alkaline Phosphatase 75 38 - 126 U/L   Total Bilirubin 0.5 0.3 - 1.2 mg/dL   GFR calc non Af Amer >60 >60 mL/min   GFR calc Af Amer >60 >60 mL/min  Comment: (NOTE) The eGFR has been calculated using the CKD EPI equation. This calculation has not been validated in all clinical situations. eGFR's persistently <60 mL/min signify possible Chronic Kidney Disease.    Anion gap 7 5 - 15  CBC with Differential     Status: Abnormal   Collection Time: 08/17/17  6:15 PM  Result Value Ref Range   WBC 8.6 4.0 - 10.5 K/uL   RBC 3.86 (L) 3.87 - 5.11 MIL/uL   Hemoglobin 11.5 (L) 12.0 - 15.0 g/dL   HCT 36.4 36.0 - 46.0 %   MCV 94.3 78.0 - 100.0 fL   MCH 29.8 26.0 - 34.0 pg   MCHC 31.6 30.0 - 36.0 g/dL   RDW 13.7 11.5 - 15.5 %   Platelets 203 150 - 400 K/uL   Neutrophils Relative % 83 %   Neutro Abs 7.1 1.7 - 7.7 K/uL   Lymphocytes Relative 13 %   Lymphs Abs 1.1 0.7 - 4.0 K/uL   Monocytes Relative 3 %   Monocytes Absolute 0.3 0.1 - 1.0 K/uL   Eosinophils Relative 1 %   Eosinophils Absolute 0.1 0.0 - 0.7 K/uL   Basophils Relative 0 %   Basophils Absolute 0.0 0.0 - 0.1 K/uL    Dg Abdomen 1 View  Result Date: 08/17/2017 CLINICAL DATA:  Assessed gastric tube position. Recent history of  colonoscopy necessitating intubation. EXAM: ABDOMEN - 1 VIEW COMPARISON:  None. FINDINGS: The tip and side port of a gastric tube are seen in the left upper quadrant of the abdomen in the expected location of the stomach. Extensive soft tissue emphysema along the included lower thorax and right hemiabdomen is identified with tracking of pneumomediastinum into the upper abdomen outlining the upper pole of the kidney. Additionally there is pneumoperitoneum with visualization of both sides of bowel (Rigler sign)and outlining portions of the liver. Scoliotic curvature of the lumbar spine convex to the left is noted. Metallic clips project over the mid pelvis. IMPRESSION: 1. Satisfactory gastric tube position with tip and side-port in the expected location of the stomach just beneath the left hemidiaphragm. 2. There is free intraperitoneal air outlining bowel loops as well as dome of the liver. Tracking of pneumomediastinum is seen extending into the upper abdomen outlining portions of the right kidney as well. Overlying soft tissue emphysema along the right hemiabdomen and included lower thorax. These results were called by telephone at the time of interpretation on 08/17/2017 at 6:40 pm to Dr. Sherwood Gambler , who verbally acknowledged these results. Electronically Signed   By: Ashley Royalty M.D.   On: 08/17/2017 18:40   Ct Chest W Contrast  Result Date: 08/17/2017 CLINICAL DATA:  Pneumomediastinum. Post EGD and colonoscopy today with possible Largen reaction. EXAM: CT CHEST, ABDOMEN, AND PELVIS WITH CONTRAST TECHNIQUE: Multidetector CT imaging of the chest, abdomen and pelvis was performed following the standard protocol during bolus administration of intravenous contrast. CONTRAST:  175m ISOVUE-300 IOPAMIDOL (ISOVUE-300) INJECTION 61% COMPARISON:  Chest radiographs earlier this day. Abdominal CT 03/07/2015 FINDINGS: CT CHEST FINDINGS Cardiovascular: Thoracic aorta is normal in caliber. No central pulmonary embolus.  Heart is normal in size. No pericardial effusion. Mediastinum/Nodes: Extensive pneumomediastinum throughout the chest. Endotracheal and enteric tubes in place. No obvious esophageal wall thickening. No enlarged mediastinal or hilar lymph nodes. Lungs/Pleura: Moderate to large left and small to moderate right pneumothorax. Consolidations dependently within the left greater than right lung and dependent right upper lobe may be aspiration or atelectasis. Musculoskeletal: Extensive subcutaneous emphysema  about the chest wall extending into the neck, right breast, and both upper extremities. Scoliosis and degenerative change in the spine. CT ABDOMEN PELVIS FINDINGS Hepatobiliary: No focal hepatic lesion. Gallbladder physiologically distended, no calcified stone. No biliary dilatation. Pancreas: No ductal dilatation or inflammation. Spleen: Normal in size without focal abnormality. Adrenals/Urinary Tract: Normal adrenal glands. Extraperitoneal air tracks in the retroperitoneum about the right kidney. No hydronephrosis or perinephric edema. Homogeneous renal enhancement with symmetric excretion on delayed phase imaging. Simple cyst in the posterior left kidney. Urinary bladder is distended without wall thickening. Stomach/Bowel: Free intraperitoneal and extraperitoneal air in the abdomen and pelvis. In stomach is decompressed with enteric tube in place. No obvious small bowel inflammation. Gaseous distention of the proximal colon from colonoscopy earlier this day, with relatively decompressed descending and sigmoid colon. There are two linear metallic densities within the distal sigmoid colon. Extraperitoneal air adjacent to the rectum and in the perirectal perineal subcutaneous tissues. An obvious bowel defect as source of air is not confidently visualized. Vascular/Lymphatic: Mild aortic atherosclerosis without aneurysm. No obvious gas in the mesenteric vasculature. Calcified mesenteric nodes are unchanged from prior  exam. Limited assessment for adenopathy given degree of extraperitoneal air. Reproductive: Uterus and bilateral adnexa are unremarkable. Other: Extensive subcutaneous emphysema about the abdominal wall, greater on the right. Extraperitoneal air visualized in the pelvis, and throughout the retroperitoneum (right greater than left). There is a small-moderate amount of intraperitoneal air anteriorly. Scattered air throughout the lower mesentery. No definite ascites. Musculoskeletal: Scoliosis and degenerative change in the spine. IMPRESSION: 1. Extensive pneumomediastinum. Moderate to large left and small to moderate right pneumothorax. Extensive chest wall subcutaneous emphysema. 2. Extensive extraperitoneal air in the abdomen and pelvis, greater on the right. Free intraperitoneal air. Extensive subcutaneous emphysema about the abdomen and pelvis. Subcutaneous emphysema in the perineum and perirectal soft tissues. 3. No obvious bowel perforation as cause of abnormal air. Colonic distention likely related to recent colonoscopy. Metallic densities in the distal sigmoid colon are presumed clips from colonoscopy. 4. Unchanged calcified lymph nodes in the small bowel mesentery. Critical Value/emergent results were called by telephone at the time of interpretation on 08/17/2017 at approximately 9:35 pm to Dr. Sherwood Gambler , who verbally acknowledged these results. Electronically Signed   By: Jeb Levering M.D.   On: 08/17/2017 21:58   Ct Abdomen Pelvis W Contrast  Result Date: 08/17/2017 CLINICAL DATA:  Pneumomediastinum. Post EGD and colonoscopy today with possible Largen reaction. EXAM: CT CHEST, ABDOMEN, AND PELVIS WITH CONTRAST TECHNIQUE: Multidetector CT imaging of the chest, abdomen and pelvis was performed following the standard protocol during bolus administration of intravenous contrast. CONTRAST:  141m ISOVUE-300 IOPAMIDOL (ISOVUE-300) INJECTION 61% COMPARISON:  Chest radiographs earlier this day. Abdominal  CT 03/07/2015 FINDINGS: CT CHEST FINDINGS Cardiovascular: Thoracic aorta is normal in caliber. No central pulmonary embolus. Heart is normal in size. No pericardial effusion. Mediastinum/Nodes: Extensive pneumomediastinum throughout the chest. Endotracheal and enteric tubes in place. No obvious esophageal wall thickening. No enlarged mediastinal or hilar lymph nodes. Lungs/Pleura: Moderate to large left and small to moderate right pneumothorax. Consolidations dependently within the left greater than right lung and dependent right upper lobe may be aspiration or atelectasis. Musculoskeletal: Extensive subcutaneous emphysema about the chest wall extending into the neck, right breast, and both upper extremities. Scoliosis and degenerative change in the spine. CT ABDOMEN PELVIS FINDINGS Hepatobiliary: No focal hepatic lesion. Gallbladder physiologically distended, no calcified stone. No biliary dilatation. Pancreas: No ductal dilatation or inflammation. Spleen: Normal in  size without focal abnormality. Adrenals/Urinary Tract: Normal adrenal glands. Extraperitoneal air tracks in the retroperitoneum about the right kidney. No hydronephrosis or perinephric edema. Homogeneous renal enhancement with symmetric excretion on delayed phase imaging. Simple cyst in the posterior left kidney. Urinary bladder is distended without wall thickening. Stomach/Bowel: Free intraperitoneal and extraperitoneal air in the abdomen and pelvis. In stomach is decompressed with enteric tube in place. No obvious small bowel inflammation. Gaseous distention of the proximal colon from colonoscopy earlier this day, with relatively decompressed descending and sigmoid colon. There are two linear metallic densities within the distal sigmoid colon. Extraperitoneal air adjacent to the rectum and in the perirectal perineal subcutaneous tissues. An obvious bowel defect as source of air is not confidently visualized. Vascular/Lymphatic: Mild aortic  atherosclerosis without aneurysm. No obvious gas in the mesenteric vasculature. Calcified mesenteric nodes are unchanged from prior exam. Limited assessment for adenopathy given degree of extraperitoneal air. Reproductive: Uterus and bilateral adnexa are unremarkable. Other: Extensive subcutaneous emphysema about the abdominal wall, greater on the right. Extraperitoneal air visualized in the pelvis, and throughout the retroperitoneum (right greater than left). There is a small-moderate amount of intraperitoneal air anteriorly. Scattered air throughout the lower mesentery. No definite ascites. Musculoskeletal: Scoliosis and degenerative change in the spine. IMPRESSION: 1. Extensive pneumomediastinum. Moderate to large left and small to moderate right pneumothorax. Extensive chest wall subcutaneous emphysema. 2. Extensive extraperitoneal air in the abdomen and pelvis, greater on the right. Free intraperitoneal air. Extensive subcutaneous emphysema about the abdomen and pelvis. Subcutaneous emphysema in the perineum and perirectal soft tissues. 3. No obvious bowel perforation as cause of abnormal air. Colonic distention likely related to recent colonoscopy. Metallic densities in the distal sigmoid colon are presumed clips from colonoscopy. 4. Unchanged calcified lymph nodes in the small bowel mesentery. Critical Value/emergent results were called by telephone at the time of interpretation on 08/17/2017 at approximately 9:35 pm to Dr. Sherwood Gambler , who verbally acknowledged these results. Electronically Signed   By: Jeb Levering M.D.   On: 08/17/2017 21:58   Dg Chest Portable 1 View  Result Date: 08/17/2017 CLINICAL DATA:  Evaluate orogastric tube after repositioning. EXAM: PORTABLE CHEST 1 VIEW COMPARISON:  Immediate preceding chest radiograph performed at 1642 hours FINDINGS: Diffuse subcutaneous emphysema is noted with pneumomediastinum as before. Heart size remains stable. No mediastinal widening. There is  aortic atherosclerosis. No conclusive evidence for pneumo thorax though difficult to identify due to the extensive overlying soft tissue emphysema. Low-lying endotracheal tube is again seen with tip at 1.6 cm above the carina. Gastric tube is seen extending below the left hemidiaphragm however the tip and side-port are still not visualized on repeat imaging. Curvilinear lucency medial to the dome of the right hemidiaphragm may either represent a small focus of atelectasis, continuation of the patient's known pneumomediastinum or potentially a small focus of pneumoperitoneum. Stable dextroconvex curvature of the dorsal spine at the thoracolumbar junction. IMPRESSION: 1. Low-lying endotracheal tube tip 1.6 cm above the carina. 2. Nonvisualization the tip and side port of an indwelling gastric tube. The tube does extend below the left hemidiaphragm into the abdomen. 3. Extensive subcutaneous emphysema as before with pneumomediastinum. Crescentic lucency adjacent to the right hemidiaphragm may represent continuation of the pneumomediastinum, linear atelectasis at the right lung base or possibly tiny pneumoperitoneum. Abdomen radiographs may help for better assessment of the gastric tube as well as for assessment of possible air beneath the diaphragm (ordered at time of dictation). Electronically Signed   By: Shanon Brow  Randel Pigg M.D.   On: 08/17/2017 18:32   Dg Chest Portable 1 View  Result Date: 08/17/2017 CLINICAL DATA:  Endotracheal and gastric tube placement EXAM: PORTABLE CHEST 1 VIEW COMPARISON:  04/23/2014 FINDINGS: Extensive subcutaneous emphysema is seen at the base of the neck and overlying the thorax as well as upper abdomen. The tip of an endotracheal tube is 1.7 cm above the carina. A gastric tube extends into the expected location of the stomach however the tip and side port are excluded on the current exam. Heart is normal in size. There is aortic atherosclerosis. Pneumomediastinum outlining the aortic arch and  left heart border as well as base of heart is seen. Crescentic lucencies are seen at the lung bases that may either be related to the pneumomediastinum although the possibility of small amount of free air is not excluded as well. Dextroconvex curvature is stable at the thoracolumbar junction. IMPRESSION: 1. Low lying endotracheal tube approximately 1.7 cm above the carina. Pullback at least 2 cm suggested. 2. Gastric tube tip and side port are excluded on the current exam but extends below the left hemidiaphragm. 3. Diffuse extensive subcutaneous emphysema with pneumomediastinum. 4. There are small crescentic lucencies at the base of the lungs which in part likely represents continuation of the pneumomediastinum although the possibility of a tiny amount of free intraperitoneal air beneath the diaphragm is not excluded, especially on the right. Electronically Signed   By: Ashley Royalty M.D.   On: 08/17/2017 17:49    Review of Systems  Unable to perform ROS: Acuity of condition   Blood pressure 104/75, pulse 100, temperature (!) 95.5 F (35.3 C), temperature source Temporal, resp. rate 19, height 5' 1.5" (1.562 m), weight 65.3 kg (144 lb), SpO2 96 %. Physical Exam  Vitals reviewed. Constitutional: She appears well-developed and well-nourished.  HENT:  Swollen face and eyelids  Eyes: Conjunctivae and EOM are normal. Pupils are equal, round, and reactive to light.  Cardiovascular:  Tachycardic, S1 and S2  Respiratory:  Ventilated  GI: Soft. She exhibits no distension. There is no tenderness. There is no guarding.  Neurological: She is alert.  Surprising coherent for being intubated and on fentanyl  Skin: Skin is warm and dry.    Assessment/Plan: 72 yo female who underwent colonoscopy with mucosal tear repaired with clips with large amount of subcutaneous air as well as pneumoperitoneum. She is nontender in her abdomen despite the large amount of subcutaneous air and has no leukocytosis at this  time. -We will continue to monitor her closely for any evidence of iatrogenic injury -will defer pneumothorax treatment to [place] 08/17/2017, 10:23 PM

## 2017-08-17 NOTE — Progress Notes (Signed)
eLink Physician-Brief Progress Note Patient Name: Linda Berg DOB: 06-Nov-1945 MRN: [id number]   Date of Service  08/17/2017  HPI/Events of Note  Agitation - Request for Fentanyl IV infusion for sedation.   eICU Interventions  Will order: 1. Fentanyl IV infusion. Titrate to RASS = 0.     Intervention Category Major Interventions: Respiratory failure - evaluation and management Minor Interventions: Agitation / anxiety - evaluation and management  Sommer,Steven Eugene 08/17/2017, 9:46 PM

## 2017-08-17 NOTE — ED Provider Notes (Signed)
[facility] EMERGENCY DEPARTMENT Provider Note   CSN: [id number] Arrival date & time: 08/17/17  1641   LEVEL 5 CAVEAT - INTUBATED  History   Chief Complaint Chief Complaint  Patient presents with  . Allergic Reaction  . Respiratory Distress    HPI Linda Berg is a 72 y.o. female.  HPI  72 year old female sent over from the gastroenterology suite after suffering an apparent allergic reaction to propofol.  The patient was getting a colonoscopy by Dr. Lavon Paganini and towards the end of the procedure started developing hypoxia, trouble breathing, and tongue swelling.  She was given 125 mg Solu-Medrol, 50 mg Benadryl, and 1 mg epinephrine (unclear what concentration or how it was administered).  She was intubated.  She has been mildly hypotensive with blood pressures in the 80s and 90s for EMS.  She has received fentanyl and Versed.  Past Medical History:  Diagnosis Date  . Barrett esophagus   . Diverticulosis   . Dyslipidemia   . Facial numbness   . GERD (gastroesophageal reflux disease)   . Hearing loss   . Hiatal hernia   . Hypothyroidism   . Rheumatoid arteritis     Patient Active Problem List   Diagnosis Date Noted  . Trochanteric bursitis of both hips 03/16/2017  . Rheumatoid arthritis involving multiple sites with positive rheumatoid factor (HCC) 10/09/2016  . Sicca syndrome, unspecified (HCC) 10/09/2016  . Osteopenia of multiple sites 10/09/2016  . History of scoliosis 10/09/2016  . History of gastroesophageal reflux (GERD) 10/09/2016  . History of hypothyroidism 10/09/2016  . History of hyperlipidemia 10/09/2016  . High risk medication use 10/09/2016  . Primary osteoarthritis of both hands 10/09/2016  . Paresthesia 10/11/2014  . Hyperlipidemia 10/11/2014  . Small vessel disease, 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/11/2014  . Tinnitus 09/17/2014  . Abdominal pain 09/05/2014  . Acute cystitis 09/05/2014  . Arthritis 09/05/2014  . Cough 09/05/2014  .  Esophagitis 09/05/2014  . Facial numbness 09/05/2014  . Difficulty hearing 09/05/2014  . H/O disease 09/05/2014  . HLD (hyperlipidemia) 09/05/2014  . Muscle ache 09/05/2014  . Symptoms involving urinary system 09/05/2014  . Awareness of heartbeats 09/05/2014  . Acne erythematosa 09/05/2014  . Absence of bladder continence 09/05/2014  . Urgency of micturation 09/05/2014  . Routine general medical examination at a health care facility 09/05/2014  . DYSPHAGIA 01/09/2009  . HYPOTHYROIDISM 11/04/2007  . DYSLIPIDEMIA 11/04/2007  . GERD 11/04/2007  . 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/04/2007  . DUODENITIS, WITHOUT HEMORRHAGE 11/04/2007  . HIATAL HERNIA 11/04/2007  . DIVERTICULOSIS, COLON 11/04/2007  . CONSTIPATION, CHRONIC 11/04/2007  . RECTAL BLEEDING 11/04/2007    Past Surgical History:  Procedure Laterality Date  . INTRAUTERINE DEVICE INSERTION    . IUD REMOVAL    . KNEE ARTHROSCOPY  12/25/11   left  . LEFT OOPHORECTOMY  03/13/98  . MANDIBLE SURGERY  9/93   pallete expansion  . TONSILLECTOMY    . TUBAL LIGATION      OB History    No data available       Home Medications    Prior to Admission medications   Medication Sig Start Date End Date Taking? Authorizing Provider  cycloSPORINE (RESTASIS) 0.05 % ophthalmic emulsion Place 1 drop into both eyes 2 (two) times daily.   Yes [provider]  hydroxychloroquine (PLAQUENIL) 200 MG tablet TAKE 1 TABLET BY MOUTH  DAILY 06/17/17  Yes Deveshwar, Janalyn Rouse, MD  lovastatin (MEVACOR) 20 MG tablet Take 20 mg by mouth daily.   Yes  [provider]  thyroid (ARMOUR) 60 MG tablet Take 60 mg by mouth daily before breakfast.   Yes [provider]  AMBULATORY NON FORMULARY MEDICATION ALLERGY SHOTS Once a week    [provider]  aspirin 81 MG tablet Take 81 mg by mouth 2 (two) times daily.    [provider]  azelastine (ASTELIN) 137 MCG/SPRAY nasal spray Place 1 spray into the nose 2 (two) times daily. Use in  each nostril as directed    [provider]  B Complex-C (SUPER B COMPLEX PO) Take 1 tablet by mouth daily.    [provider]  Biotin 5000 MCG TABS Take 1 tablet by mouth 1 day or 1 dose.     [provider]  cetirizine (ZYRTEC) 10 MG tablet Take 10 mg by mouth at bedtime.    [provider]  cholecalciferol (VITAMIN D) 400 units TABS tablet Take 400 Units by mouth daily.    [provider]  Coenzyme Q10 (CO Q 10) 100 MG CAPS Take 1 capsule by mouth 2 (two) times daily.    [provider]  famotidine (PEPCID) 20 MG tablet Take 20 mg by mouth daily.     [provider]  Boris Lown Oil 300 MG CAPS Take 1 capsule by mouth daily.    [provider]  lansoprazole (PREVACID) 15 MG capsule Take 15 mg by mouth daily at 12 noon.    [provider]  Magnesium 250 MG TABS Take 1 tablet by mouth 2 (two) times daily.    [provider]  metoCLOPramide (REGLAN) 10 MG tablet Take 1 tablet (10 mg total) by mouth as directed. 07/08/17   Unk Lightning, PA  Probiotic Product (PROBIOTIC DAILY PO) Take 1 tablet by mouth daily.     [provider]  vitamin C (ASCORBIC ACID) 500 MG tablet Take 500 mg by mouth 2 (two) times daily.    [provider]  Zinc 50 MG CAPS Take by mouth.    [provider]    Family History Family History  Problem Relation Age of Onset  . Heart disease Mother   . Stroke Father   . Heart disease Maternal Aunt        aunt x 2  . Irritable bowel syndrome Son   . GER disease Son   . Colon cancer Neg Hx     Social History Social History   Tobacco Use  . Smoking status: Never Smoker  . Smokeless tobacco: Never Used  Substance Use Topics  . Alcohol use: No    Alcohol/week: 0.0 oz  . Drug use: No     Allergies   Ciprofloxacin; Propofol; Terbinafine; and Prednisone   Review of Systems Review of Systems  Unable to perform ROS: Intubated     Physical  Exam Updated Vital Signs BP 105/63   Pulse 85   Temp (!) 95.5 F (35.3 C) (Temporal)   Resp (!) 23   Ht 5' 1.5" (1.562 m)   Wt 65.3 kg (144 lb)   SpO2 96%   BMI 26.77 kg/m   Physical Exam  Constitutional: She is oriented to person, place, and time. She appears well-developed and well-nourished. She is intubated.  HENT:  Head: Normocephalic and atraumatic.  Right Ear: External ear normal.  Left Ear: External ear normal.  Nose: Nose normal.  Tongue appears normal size, exam limited due to ETT  Eyes: Right eye exhibits no discharge. Left eye exhibits no discharge.  Cardiovascular: Normal rate, regular rhythm and normal heart sounds.  Pulmonary/Chest: She is intubated. She has decreased breath sounds (left sided).  Abdominal: Soft. She exhibits no distension. There is no tenderness.  Neurological: She is alert and oriented to person, place, and time.  Skin: Skin is warm and dry. She is not diaphoretic.  Nursing note and vitals reviewed.    ED Treatments / Results  Labs (all labs ordered are listed, but only abnormal results are displayed) Labs Reviewed  COMPREHENSIVE METABOLIC PANEL - Abnormal; Notable for the following components:      Result Value   Potassium 3.4 (*)    Chloride 112 (*)    Glucose, Bld 127 (*)    Calcium 7.7 (*)    Total Protein 5.8 (*)    All other components within normal limits  CBC WITH DIFFERENTIAL/PLATELET - Abnormal; Notable for the following components:   RBC 3.86 (*)    Hemoglobin 11.5 (*)    All other components within normal limits  I-STAT ARTERIAL BLOOD GAS, ED - Abnormal; Notable for the following components:   pO2, Arterial 150.0 (*)    TCO2 21 (*)    Acid-base deficit 5.0 (*)    All other components within normal limits    EKG  EKG Interpretation  Date/Time:  Tuesday August 17 2017 16:46:22 EST Ventricular Rate:  78 PR Interval:    QRS Duration: 96 QT Interval:  481 QTC Calculation: 548 R Axis:   79 Text Interpretation:   Sinus rhythm Consider left atrial enlargement Low voltage, extremity and precordial leads Nonspecific T abnormalities, lateral leads Minimal ST elevation, inferior leads Prolonged QT interval Confirmed by Pricilla Loveless [phone]) on 08/17/2017 5:13:30 PM       Radiology Dg Abdomen 1 View  Result Date: 08/17/2017 CLINICAL DATA:  Assessed gastric tube position. Recent history of colonoscopy necessitating intubation. EXAM: ABDOMEN - 1 VIEW COMPARISON:  None. FINDINGS: The tip and side port of a gastric tube are seen in the left upper quadrant of the abdomen in the expected location of the stomach. Extensive soft tissue emphysema along the included lower thorax and right hemiabdomen is identified with tracking of pneumomediastinum into the upper abdomen outlining the upper pole of the kidney. Additionally there is pneumoperitoneum with visualization of both sides of bowel (Rigler sign)and outlining portions of the liver. Scoliotic curvature of the lumbar spine convex to the left is noted. Metallic clips project over the mid pelvis. IMPRESSION: 1. Satisfactory gastric tube position with tip and side-port in the expected location of the stomach just beneath the left hemidiaphragm. 2. There is free intraperitoneal air outlining bowel loops as well as dome of the liver. Tracking of pneumomediastinum is seen extending into the upper abdomen outlining portions of the right kidney as well. Overlying soft tissue emphysema along the right hemiabdomen and included lower thorax. These results were called by telephone at the time of interpretation on 08/17/2017 at 6:40 pm to Dr. Pricilla Loveless , who verbally acknowledged these results. Electronically Signed   By: Tollie Eth M.D.   On: 08/17/2017 18:40   Dg Chest Portable 1 View  Result Date: 08/17/2017 CLINICAL DATA:  Evaluate orogastric tube after repositioning. EXAM: PORTABLE CHEST 1 VIEW COMPARISON:  Immediate preceding chest radiograph performed at 1642 hours FINDINGS:  Diffuse subcutaneous emphysema is noted with pneumomediastinum as before. Heart size remains stable. No mediastinal widening. There is aortic atherosclerosis. No conclusive evidence for pneumo thorax though difficult to identify due to the extensive overlying soft  tissue emphysema. Low-lying endotracheal tube is again seen with tip at 1.6 cm above the carina. Gastric tube is seen extending below the left hemidiaphragm however the tip and side-port are still not visualized on repeat imaging. Curvilinear lucency medial to the dome of the right hemidiaphragm may either represent a small focus of atelectasis, continuation of the patient's known pneumomediastinum or potentially a small focus of pneumoperitoneum. Stable dextroconvex curvature of the dorsal spine at the thoracolumbar junction. IMPRESSION: 1. Low-lying endotracheal tube tip 1.6 cm above the carina. 2. Nonvisualization the tip and side port of an indwelling gastric tube. The tube does extend below the left hemidiaphragm into the abdomen. 3. Extensive subcutaneous emphysema as before with pneumomediastinum. Crescentic lucency adjacent to the right hemidiaphragm may represent continuation of the pneumomediastinum, linear atelectasis at the right lung base or possibly tiny pneumoperitoneum. Abdomen radiographs may help for better assessment of the gastric tube as well as for assessment of possible air beneath the diaphragm (ordered at time of dictation). Electronically Signed   By: Tollie Eth M.D.   On: 08/17/2017 18:32   Dg Chest Portable 1 View  Result Date: 08/17/2017 CLINICAL DATA:  Endotracheal and gastric tube placement EXAM: PORTABLE CHEST 1 VIEW COMPARISON:  04/23/2014 FINDINGS: Extensive subcutaneous emphysema is seen at the base of the neck and overlying the thorax as well as upper abdomen. The tip of an endotracheal tube is 1.7 cm above the carina. A gastric tube extends into the expected location of the stomach however the tip and side port are  excluded on the current exam. Heart is normal in size. There is aortic atherosclerosis. Pneumomediastinum outlining the aortic arch and left heart border as well as base of heart is seen. Crescentic lucencies are seen at the lung bases that may either be related to the pneumomediastinum although the possibility of small amount of free air is not excluded as well. Dextroconvex curvature is stable at the thoracolumbar junction. IMPRESSION: 1. Low lying endotracheal tube approximately 1.7 cm above the carina. Pullback at least 2 cm suggested. 2. Gastric tube tip and side port are excluded on the current exam but extends below the left hemidiaphragm. 3. Diffuse extensive subcutaneous emphysema with pneumomediastinum. 4. There are small crescentic lucencies at the base of the lungs which in part likely represents continuation of the pneumomediastinum although the possibility of a tiny amount of free intraperitoneal air beneath the diaphragm is not excluded, especially on the right. Electronically Signed   By: Tollie Eth M.D.   On: 08/17/2017 17:49    Procedures .Critical Care Performed by: Pricilla Loveless, MD Authorized by: Pricilla Loveless, MD   Critical care provider statement:    Critical care time (minutes):  45   Critical care was necessary to treat or prevent imminent or life-threatening deterioration of the following conditions:  Circulatory failure and respiratory failure   Critical care was time spent personally by me on the following activities:  Development of treatment plan with patient or surrogate, discussions with consultants, evaluation of patient's response to treatment, examination of patient, obtaining history from patient or surrogate, ordering and performing treatments and interventions, ordering and review of laboratory studies, ordering and review of radiographic studies, pulse oximetry, re-evaluation of patient's condition, review of old charts and ventilator management   (including  critical care time)  Medications Ordered in ED Medications  fentaNYL (SUBLIMAZE) injection 50 mcg (not administered)  midazolam (VERSED) injection 1 mg (1 mg Intravenous Given 08/17/17 1654)  midazolam (VERSED) injection 1 mg (  not administered)  piperacillin-tazobactam (ZOSYN) IVPB 3.375 g (not administered)  sodium chloride 0.9 % bolus 1,000 mL (0 mLs Intravenous Stopped 08/17/17 1826)  fentaNYL (SUBLIMAZE) injection 50 mcg (50 mcg Intravenous Given 08/17/17 1825)  sodium chloride 0.9 % bolus 1,000 mL (0 mLs Intravenous Stopped 08/17/17 1826)     Initial Impression / Assessment and Plan / ED Course  I have reviewed the triage vital signs and the nursing notes.  Pertinent labs & imaging results that were available during my care of the patient were reviewed by me and considered in my medical decision making (see chart for details).  Clinical Course as of Aug 17 1937  Tue Aug 17, 2017  1711 Discussed with ICU, they are currently dealing with another critical patient and will see her soon.  She has low blood pressures but her map is 70 or more.  She will be given IV fluids.  [SG]    Clinical Course User Index [SG] Pricilla Loveless, MD    Shortly after arrival, patient was examined and noted to have decreased lung sounds on the left and her ET tube was at 26 cm.  This was retracted 2 cm and then retracted 2 cm more after chest x-ray.  She has been ventilating well and has not been hypoxic.  She has had some low blood pressures, given IV fluids.  Fentanyl and Versed as needed.  She was given a dose of antibiotics for possible pneumoperitoneum, CT ordered.  CT scan shows bilateral pneumothoraces, left worse than right.  Also has pneumoperitoneum.  ICU made aware of the bilateral pneumothorax and is able to place chest tubes.  General surgery was consulted for the pneumoperitoneum.  She had already been given a dose of Zosyn.  Her vital signs have remained stable besides moderately low blood pressures  but with a good map.  She was transported to the ICU in critical condition.  Final Clinical Impressions(s) / ED Diagnoses   Final diagnoses:  Anaphylaxis, initial encounter  Acute respiratory failure, unspecified whether with hypoxia or hypercapnia [facility])    ED Discharge Orders    None       Pricilla Loveless, MD 08/18/17 [phone]

## 2017-08-17 NOTE — ED Triage Notes (Signed)
Pt arrived EMS from [place] she was having a colonoscopy/endo and had an allergic reaction/angioedema to propofol, pt given 5mg  versed given for sedation to intubate, 50mg  benadryl, 125 solumedrol, 1mg  epi PTA at [facility]. Pt given fentanyl with EMS.

## 2017-08-17 NOTE — Op Note (Addendum)
[facility] Patient Name: Linda Berg Procedure Date: 08/17/2017 2:37 PM MRN: [id number] Endoscopist: Napoleon Form , MD Age: 72 Referring MD:  Date of Birth: [birth year] Gender: Female Account #: [id number] Procedure:                Colonoscopy Indications:              Screening for colorectal malignant neoplasm Medicines:                Monitored Anesthesia Care Procedure:                Pre-Anesthesia Assessment:                           - Prior to the procedure, a History and Physical                            was performed, and patient medications and                            allergies were reviewed. The patient's tolerance of                            previous anesthesia was also reviewed. The risks                            and benefits of the procedure and the sedation                            options and risks were discussed with the patient.                            All questions were answered, and informed consent                            was obtained. Prior Anticoagulants: The patient has                            taken no previous anticoagulant or antiplatelet                            agents. ASA Grade Assessment: II - A patient with                            mild systemic disease. After reviewing the risks                            and benefits, the patient was deemed in                            satisfactory condition to undergo the procedure.                           After obtaining informed consent, the colonoscope  was passed under direct vision. Throughout the                            procedure, the patient's blood pressure, pulse, and                            oxygen saturations were monitored continuously. The                            Model GIF-HQ190 [id number]) scope was introduced                            through the anus and advanced to the the cecum,                            identified by  appendiceal orifice and ileocecal                            valve. The colonoscopy was unusually difficult due                            to restricted mobility of the colon. The patient                            tolerated the procedure well. The quality of the                            bowel preparation was fair. The ileocecal valve,                            appendiceal orifice, and rectum were photographed. Scope In: 2:55:20 PM Scope Out: 3:50:23 PM Scope Withdrawal Time: 0 hours 22 minutes 57 seconds  Total Procedure Duration: 0 hours 55 minutes 3 seconds  Findings:                 The perianal and digital rectal examinations were                            normal.                           A non-bleeding mucosal tear was found in the                            recto-sigmoid colon at 20cm from anal verge. This                            measured 6 mm in length. To repair the defect, the                            tissue edges were approximated and four hemostatic                            clips were placed (MR conditional).  Closure of the                            defect was successful. There was no bleeding at the                            end of the procedure.                           Scope was changed to upper endoscope from the                            pediatric colonoscope and was advanced to the                            cecum. 2 clips dislodged and placed 2 additional                            clips to close the defect.                           A few small-mouthed diverticula were found in the                            sigmoid colon.                           Retrofelxion was not performed in the rectum at the                            end of the procedure Complications:            Tear Estimated Blood Loss:     Estimated blood loss was minimal. Impression:               - Preparation of the colon was fair.                           - Mucosal tear in the  recto-sigmoid colon. Clips                            (MR conditional) were placed.                           - Diverticulosis in the sigmoid colon.                           - No specimens collected. Recommendation:           - NPO.                           - Continue present medications.                           - No repeat colonoscopy. Napoleon Form, MD 08/17/2017 4:47:10 PM This report has been signed electronically.

## 2017-08-17 NOTE — Procedures (Signed)
Chest Tube Insertion Procedure Note  Indications:  Clinically significant Pneumothorax  Pre-operative Diagnosis: Pneumothorax R  Post-operative Diagnosis: Pneumothorax R  Procedure Details  Informed consent was obtained for the procedure, including sedation.  Risks of lung perforation, hemorrhage, arrhythmia, and adverse drug reaction were discussed.   Patient was pre-medicated with fentanyl and 2 mg versed  After sterile skin prep, using standard technique, a small bore pigtail chest tube was placed in the right lateral 5th rib space over a guidewire after dilation.   Findings: None  Estimated Blood Loss:  Minimal         Specimens:  None              Complications:  None; patient tolerated the procedure well.         Disposition: ICU - intubated and critically ill.         Condition: stable  Joneen Roach, AGACNP-BC [facility] Pulmonology/Critical Care Pager [phone] or [phone]  08/17/2017 11:26 PM

## 2017-08-17 NOTE — ED Notes (Signed)
Spoke with CCM about orders and bed request... Awaiting further orders at this time.

## 2017-08-17 NOTE — Progress Notes (Addendum)
At conclusion of endoscopy procedure (1550), pt noted to have sudden severe swelling to R side of face and stridor. Dr Silverio Decamp called back to bedside to evaluate. Ventilation provided using ambu bag, nasal trumpet to L nare. EMS was called. Benadryl 11m, Epinephrine 1g, and Methylprednisolone 1263madministered intravenously. Dr WeMelvyn Novasnd Dr McLake Bellsalled to bedside.  Intubated at 1613 by Dr McLake Bells- #7.0 ETT using Mac 3, placement verified by auscultation by Dr WeMelvyn Novasnd +etCO2. Sedated with 76m576midazolam.   At 1620 pt care was transferred to the Medic 70 team led by Mas[facility] had tolerated the procedure and sedation without difficulty and was stable throughout. Onset was sudden at procedure end. Total procedure time was >1hour.Pt received total of 550m30mopofol and 1000cc normal saline.

## 2017-08-17 NOTE — Progress Notes (Signed)
1-2 minutes after completion of EGD and colonoscopy ,  I was notified by RN and tech to evaluate patient. She developed facial edema with periorbital swelling and stridor.  Nasal trumpet was placed and ambu bag was used to ventilate. She was given incremental doses of benadryl (total 50mg ), epinephrine (total dose 1gm) and Solumedrol 120mg . Pulm & Critical care physicians (Dr and Dr ) were bedside and patient was intubated. EMS transported patient to [facility].  Patient's husband was notified. Please refer to Anesthesia note to detailed charting with time and dose of medications  K. Kendrick Fries , MD [phone] Mon-Fri 8a-5p [phone] after 5p, weekends, holidays

## 2017-08-17 NOTE — H&P (Addendum)
PULMONARY / CRITICAL CARE MEDICINE   Name: Linda Berg MRN: [id number] DOB: 1946/06/25    ADMISSION DATE:  08/17/2017 CONSULTATION DATE:  08/17/2017  REFERRING MD:  Dr. Pricilla Loveless  CHIEF COMPLAINT:  anaphylaxis  HISTORY OF PRESENT ILLNESS:  This 72 year old Caucasian female is seen in consultation at the request of Dr. Pricilla Loveless for recommendations on further evaluation and management of anaphylaxis and endotracheal intubation. The patient is seen in the ER, where the patient has transferred from the outpatient endoscopy suite. Case was discussed with Dr. Marsa Aris. She reports that the patient underwent an uneventful EGD and colonoscopy under moderate sedation with propofol. The patient had successfully completed her endoscopy procedures and was receiving post-procedure care, when she acutely became visibly swollen. She demonstrated stridorous breathing and was difficult to support with bag-mask ventilation. So, she was intubated. She was treated with Benadryl, epinephrine and SoluMedrol prior to my clinical interview and reportedly has already shown signs of improvement. Although intubated, the patient is awake, alert and answers yes/no questions. She follows commands appropriately. Of note, the patient did NOT experience cardiopulmonary arrest during the course of her presentation.   PAST MEDICAL HISTORY :  She  has a past medical history of Barrett esophagus, Diverticulosis, Dyslipidemia, Facial numbness, GERD (gastroesophageal reflux disease), Hearing loss, Hiatal hernia, Hypothyroidism, and Rheumatoid arteritis.  PAST SURGICAL HISTORY: She  has a past surgical history that includes Mandible surgery (9/93); Tonsillectomy; Tubal ligation; Intrauterine device insertion; IUD removal; Left oophorectomy (03/13/98); and Knee arthroscopy (12/25/11).  Allergies  Allergen Reactions  . Ciprofloxacin Anaphylaxis    Facial numbness  . Propofol Anaphylaxis  . Terbinafine Rash  .  Prednisone Other (See Comments)    Stomach spasms    Current Facility-Administered Medications on File Prior to Encounter  Medication  . 0.9 %  sodium chloride infusion   Current Outpatient Medications on File Prior to Encounter  Medication Sig  . cycloSPORINE (RESTASIS) 0.05 % ophthalmic emulsion Place 1 drop into both eyes 2 (two) times daily.  . hydroxychloroquine (PLAQUENIL) 200 MG tablet TAKE 1 TABLET BY MOUTH  DAILY  . lovastatin (MEVACOR) 20 MG tablet Take 20 mg by mouth daily.  Marland Kitchen thyroid (ARMOUR) 60 MG tablet Take 60 mg by mouth daily before breakfast.  . AMBULATORY NON FORMULARY MEDICATION ALLERGY SHOTS Once a week  . aspirin 81 MG tablet Take 81 mg by mouth 2 (two) times daily.  Marland Kitchen azelastine (ASTELIN) 137 MCG/SPRAY nasal spray Place 1 spray into the nose 2 (two) times daily. Use in each nostril as directed  . B Complex-C (SUPER B COMPLEX PO) Take 1 tablet by mouth daily.  . Biotin 5000 MCG TABS Take 1 tablet by mouth 1 day or 1 dose.   . cetirizine (ZYRTEC) 10 MG tablet Take 10 mg by mouth at bedtime.  . cholecalciferol (VITAMIN D) 400 units TABS tablet Take 400 Units by mouth daily.  . Coenzyme Q10 (CO Q 10) 100 MG CAPS Take 1 capsule by mouth 2 (two) times daily.  . famotidine (PEPCID) 20 MG tablet Take 20 mg by mouth daily.   Boris Lown Oil 300 MG CAPS Take 1 capsule by mouth daily.  . lansoprazole (PREVACID) 15 MG capsule Take 15 mg by mouth daily at 12 noon.  . Magnesium 250 MG TABS Take 1 tablet by mouth 2 (two) times daily.  . metoCLOPramide (REGLAN) 10 MG tablet Take 1 tablet (10 mg total) by mouth as directed.  . Probiotic Product (PROBIOTIC  DAILY PO) Take 1 tablet by mouth daily.   . vitamin C (ASCORBIC ACID) 500 MG tablet Take 500 mg by mouth 2 (two) times daily.  . Zinc 50 MG CAPS Take by mouth.    FAMILY HISTORY:  Her indicated that her mother is deceased. She indicated that her father is deceased. She indicated that the status of her maternal aunt is unknown.  She indicated that the status of her neg hx is unknown.   SOCIAL HISTORY: She  reports that  has never smoked. she has never used smokeless tobacco. She reports that she does not drink alcohol or use drugs.  REVIEW OF SYSTEMS:   Denies pain and discomfort aside from that associated with ETT tube. No abdominal pain.   VITAL SIGNS: BP 104/75   Pulse 100   Temp (!) 95.5 F (35.3 C) (Temporal)   Resp 19   Ht 5' 1.5" (1.562 m)   Wt 65.3 kg (144 lb)   SpO2 96%   BMI 26.77 kg/m   HEMODYNAMICS:    VENTILATOR SETTINGS: Vent Mode: PRVC FiO2 (%):  [100 %] 100 % Set Rate:  [14 bmp] 14 bmp Vt Set:  [400 mL] 400 mL PEEP:  [5 cmH20] 5 cmH20 Plateau Pressure:  [19 cmH20] 19 cmH20  INTAKE / OUTPUT: I/O last 3 completed shifts: In: 2000 [IV Piggyback:2000] Out: -   PHYSICAL EXAMINATION: General:  Intubated. Sedated. Awake, following commands. Apparent subcutaneous emphysema involving the face, neck, trunk. Neuro:  Facial symmetry HEENT:  R palpebral involvement of subcutaneous emphysema. No obvious mucosal edema. Cardiovascular:  Regular S1 and S2 without murmur, rub or gallop Lungs:  Good air entry. Crepitus but no crackles or wheezes. Abdomen:  Tympanic but nontender. Bowel sounds present. Musculoskeletal:  Trace edema. No clubbing. No cyanosis. Skin:  Subcutaneous emphysema  LABS:  BMET Recent Labs  Lab 08/17/17 1815  NA 141  K 3.4*  CL 112*  CO2 22  BUN 9  CREATININE 0.71  GLUCOSE 127*    Electrolytes Recent Labs  Lab 08/17/17 1815  CALCIUM 7.7*    CBC Recent Labs  Lab 08/17/17 1815  WBC 8.6  HGB 11.5*  HCT 36.4  PLT 203    Coag's No results for input(s): APTT, INR in the last 168 hours.  Sepsis Markers No results for input(s): LATICACIDVEN, PROCALCITON, O2SATVEN in the last 168 hours.  ABG Recent Labs  Lab 08/17/17 1740  PHART 7.362  PCO2ART 34.8  PO2ART 150.0*    Liver Enzymes Recent Labs  Lab 08/17/17 1815  AST 27  ALT 25   ALKPHOS 75  BILITOT 0.5  ALBUMIN 3.5    Cardiac Enzymes No results for input(s): TROPONINI, PROBNP in the last 168 hours.  Glucose No results for input(s): GLUCAP in the last 168 hours.  Imaging Dg Abdomen 1 View  Result Date: 08/17/2017 CLINICAL DATA:  Assessed gastric tube position. Recent history of colonoscopy necessitating intubation. EXAM: ABDOMEN - 1 VIEW COMPARISON:  None. FINDINGS: The tip and side port of a gastric tube are seen in the left upper quadrant of the abdomen in the expected location of the stomach. Extensive soft tissue emphysema along the included lower thorax and right hemiabdomen is identified with tracking of pneumomediastinum into the upper abdomen outlining the upper pole of the kidney. Additionally there is pneumoperitoneum with visualization of both sides of bowel (Rigler sign)and outlining portions of the liver. Scoliotic curvature of the lumbar spine convex to the left is noted. Metallic clips project over  the mid pelvis. IMPRESSION: 1. Satisfactory gastric tube position with tip and side-port in the expected location of the stomach just beneath the left hemidiaphragm. 2. There is free intraperitoneal air outlining bowel loops as well as dome of the liver. Tracking of pneumomediastinum is seen extending into the upper abdomen outlining portions of the right kidney as well. Overlying soft tissue emphysema along the right hemiabdomen and included lower thorax. These results were called by telephone at the time of interpretation on 08/17/2017 at 6:40 pm to Dr. Pricilla Loveless , who verbally acknowledged these results. Electronically Signed   By: Tollie Eth M.D.   On: 08/17/2017 18:40   Dg Chest Portable 1 View  Result Date: 08/17/2017 CLINICAL DATA:  Evaluate orogastric tube after repositioning. EXAM: PORTABLE CHEST 1 VIEW COMPARISON:  Immediate preceding chest radiograph performed at 1642 hours FINDINGS: Diffuse subcutaneous emphysema is noted with pneumomediastinum as  before. Heart size remains stable. No mediastinal widening. There is aortic atherosclerosis. No conclusive evidence for pneumo thorax though difficult to identify due to the extensive overlying soft tissue emphysema. Low-lying endotracheal tube is again seen with tip at 1.6 cm above the carina. Gastric tube is seen extending below the left hemidiaphragm however the tip and side-port are still not visualized on repeat imaging. Curvilinear lucency medial to the dome of the right hemidiaphragm may either represent a small focus of atelectasis, continuation of the patient's known pneumomediastinum or potentially a small focus of pneumoperitoneum. Stable dextroconvex curvature of the dorsal spine at the thoracolumbar junction. IMPRESSION: 1. Low-lying endotracheal tube tip 1.6 cm above the carina. 2. Nonvisualization the tip and side port of an indwelling gastric tube. The tube does extend below the left hemidiaphragm into the abdomen. 3. Extensive subcutaneous emphysema as before with pneumomediastinum. Crescentic lucency adjacent to the right hemidiaphragm may represent continuation of the pneumomediastinum, linear atelectasis at the right lung base or possibly tiny pneumoperitoneum. Abdomen radiographs may help for better assessment of the gastric tube as well as for assessment of possible air beneath the diaphragm (ordered at time of dictation). Electronically Signed   By: Tollie Eth M.D.   On: 08/17/2017 18:32   Dg Chest Portable 1 View  Result Date: 08/17/2017 CLINICAL DATA:  Endotracheal and gastric tube placement EXAM: PORTABLE CHEST 1 VIEW COMPARISON:  04/23/2014 FINDINGS: Extensive subcutaneous emphysema is seen at the base of the neck and overlying the thorax as well as upper abdomen. The tip of an endotracheal tube is 1.7 cm above the carina. A gastric tube extends into the expected location of the stomach however the tip and side port are excluded on the current exam. Heart is normal in size. There is  aortic atherosclerosis. Pneumomediastinum outlining the aortic arch and left heart border as well as base of heart is seen. Crescentic lucencies are seen at the lung bases that may either be related to the pneumomediastinum although the possibility of small amount of free air is not excluded as well. Dextroconvex curvature is stable at the thoracolumbar junction. IMPRESSION: 1. Low lying endotracheal tube approximately 1.7 cm above the carina. Pullback at least 2 cm suggested. 2. Gastric tube tip and side port are excluded on the current exam but extends below the left hemidiaphragm. 3. Diffuse extensive subcutaneous emphysema with pneumomediastinum. 4. There are small crescentic lucencies at the base of the lungs which in part likely represents continuation of the pneumomediastinum although the possibility of a tiny amount of free intraperitoneal air beneath the diaphragm is not excluded, especially  on the right. Electronically Signed   By: Tollie Eth M.D.   On: 08/17/2017 17:49    DISCUSSION: This is a 72 year old female, who is suspected to have experienced propofol-induced anaphylaxis/angioedema. This appears to have promptly improved with treatment (Benadryl, Decadron, epinephrine). However, her clinical course has been complicated by development of subcutaneous emphysema and pneumomediastinum and pneumoperitoneum. The patient's clinical status will need to be followed closely, as it is unclear how her free air was introduced.  ASSESSMENT / PLAN:  PULMONARY A: PNEUMOMEDIASTINUM SUBCUTANEOUS EMPHYSEMA ENDOTRACHEAL INTUBATION STRIDOR  P:  Admit to ICU. Titrate vent settings based on ABG results Agree with CT of the chest/abdomen   GASTROINTESTINAL A:  BARRETT ESOPHAGUS HIATAL HERNIA S/p EGD and colonoscopy  P: Agree with general surgery consultation   Marcelle Smiling, MD Pulmonary and Critical Care Medicine [facility] Pager: [phone]  08/17/2017, 9:57 PM  Cc: 45  min

## 2017-08-17 NOTE — Procedures (Signed)
Chest Tube Insertion Procedure Note  Indications:  Clinically significant Pneumothorax  Pre-operative Diagnosis: Pneumothorax  Post-operative Diagnosis: Pneumothorax  Procedure Details  Informed consent was obtained for the procedure, including sedation.  Risks of lung perforation, hemorrhage, arrhythmia, and adverse drug reaction were discussed.   After sterile skin prep, using standard technique, a pigtail chest tube was placed in the left 4th intercostal space anterior axillary line.  Findings: A gush of air was noted on chest tube placement. Chest tube secured and placed to suction. Patient tolerated procedure well.   Estimated Blood Loss:  Minimal         Specimens:  None              Complications:  None; patient tolerated the procedure well.         Disposition: ICU - intubated and critically ill.         Condition: stable  Attending Attestation: I performed the procedure.  Milana Obey, MD Pulmonary & Critical Care Medicine Pager: [phone]

## 2017-08-18 ENCOUNTER — Inpatient Hospital Stay (HOSPITAL_COMMUNITY): Payer: Medicare Other

## 2017-08-18 DIAGNOSIS — R1310 Dysphagia, unspecified: Secondary | ICD-10-CM

## 2017-08-18 DIAGNOSIS — T8182XA Emphysema (subcutaneous) resulting from a procedure, initial encounter: Secondary | ICD-10-CM

## 2017-08-18 DIAGNOSIS — J9600 Acute respiratory failure, unspecified whether with hypoxia or hypercapnia: Secondary | ICD-10-CM

## 2017-08-18 DIAGNOSIS — J9601 Acute respiratory failure with hypoxia: Secondary | ICD-10-CM

## 2017-08-18 DIAGNOSIS — S270XXA Traumatic pneumothorax, initial encounter: Secondary | ICD-10-CM

## 2017-08-18 DIAGNOSIS — T782XXA Anaphylactic shock, unspecified, initial encounter: Secondary | ICD-10-CM

## 2017-08-18 LAB — POCT I-STAT 3, ART BLOOD GAS (G3+)
ACID-BASE DEFICIT: 6 mmol/L — AB (ref 0.0–2.0)
BICARBONATE: 21.2 mmol/L (ref 20.0–28.0)
O2 Saturation: 96 %
PO2 ART: 93 mmHg (ref 83.0–108.0)
Patient temperature: 97.5
TCO2: 23 mmol/L (ref 22–32)
pCO2 arterial: 44.8 mmHg (ref 32.0–48.0)
pH, Arterial: 7.281 — ABNORMAL LOW (ref 7.350–7.450)

## 2017-08-18 LAB — CBC
HCT: 38.6 % (ref 36.0–46.0)
HEMOGLOBIN: 12.4 g/dL (ref 12.0–15.0)
MCH: 30.4 pg (ref 26.0–34.0)
MCHC: 32.1 g/dL (ref 30.0–36.0)
MCV: 94.6 fL (ref 78.0–100.0)
Platelets: 220 10*3/uL (ref 150–400)
RBC: 4.08 MIL/uL (ref 3.87–5.11)
RDW: 13.7 % (ref 11.5–15.5)
WBC: 7.9 10*3/uL (ref 4.0–10.5)

## 2017-08-18 LAB — COMPREHENSIVE METABOLIC PANEL
ALT: 30 U/L (ref 14–54)
ANION GAP: 11 (ref 5–15)
AST: 37 U/L (ref 15–41)
Albumin: 3.6 g/dL (ref 3.5–5.0)
Alkaline Phosphatase: 77 U/L (ref 38–126)
BILIRUBIN TOTAL: 0.6 mg/dL (ref 0.3–1.2)
BUN: 8 mg/dL (ref 6–20)
CHLORIDE: 108 mmol/L (ref 101–111)
CO2: 19 mmol/L — ABNORMAL LOW (ref 22–32)
Calcium: 8.2 mg/dL — ABNORMAL LOW (ref 8.9–10.3)
Creatinine, Ser: 0.64 mg/dL (ref 0.44–1.00)
GFR calc non Af Amer: >60 mL/min (ref >60)
Glucose, Bld: 126 mg/dL — ABNORMAL HIGH (ref 65–99)
POTASSIUM: 4 mmol/L (ref 3.5–5.1)
Sodium: 138 mmol/L (ref 135–145)
TOTAL PROTEIN: 6.4 g/dL — AB (ref 6.5–8.1)

## 2017-08-18 LAB — CBC WITH DIFFERENTIAL/PLATELET
BASOS ABS: 0 10*3/uL (ref 0.0–0.1)
Basophils Relative: 0 %
EOS ABS: 0 10*3/uL (ref 0.0–0.7)
EOS PCT: 0 %
HCT: 38.2 % (ref 36.0–46.0)
Hemoglobin: 12.3 g/dL (ref 12.0–15.0)
Lymphocytes Relative: 8 %
Lymphs Abs: 0.7 10*3/uL (ref 0.7–4.0)
MCH: 30.5 pg (ref 26.0–34.0)
MCHC: 32.2 g/dL (ref 30.0–36.0)
MCV: 94.8 fL (ref 78.0–100.0)
MONO ABS: 0.2 10*3/uL (ref 0.1–1.0)
Monocytes Relative: 3 %
Neutro Abs: 7.1 10*3/uL (ref 1.7–7.7)
Neutrophils Relative %: 89 %
PLATELETS: 211 10*3/uL (ref 150–400)
RBC: 4.03 MIL/uL (ref 3.87–5.11)
RDW: 13.7 % (ref 11.5–15.5)
WBC: 8 10*3/uL (ref 4.0–10.5)

## 2017-08-18 LAB — TROPONIN I
TROPONIN I: 1.23 ng/mL — AB (ref <0.03)
Troponin I: 1.33 ng/mL (ref <0.03)
Troponin I: 1.28 ng/mL (ref <0.03)

## 2017-08-18 LAB — MRSA PCR SCREENING: MRSA by PCR: NEGATIVE

## 2017-08-18 LAB — PHOSPHORUS: PHOSPHORUS: 4.1 mg/dL (ref 2.5–4.6)

## 2017-08-18 LAB — LACTIC ACID, PLASMA: LACTIC ACID, VENOUS: 2 mmol/L — AB (ref 0.5–1.9)

## 2017-08-18 LAB — PROCALCITONIN: PROCALCITONIN: 0.27 ng/mL

## 2017-08-18 LAB — MAGNESIUM: MAGNESIUM: 1.8 mg/dL (ref 1.7–2.4)

## 2017-08-18 MED ORDER — ACETAMINOPHEN 650 MG RE SUPP: 650.0000 mg | Freq: Four times a day (QID) | RECTAL | Status: DC | PRN

## 2017-08-18 MED ORDER — ORAL CARE MOUTH RINSE
15.0000 mL | Freq: Two times a day (BID) | OROMUCOSAL | Status: DC
  Administered 2017-08-19 – 2017-08-21 (×3): 15 mL via OROMUCOSAL

## 2017-08-18 MED ORDER — DIPHENHYDRAMINE HCL 50 MG/ML IJ SOLN
50.0000 mg | Freq: Four times a day (QID) | INTRAMUSCULAR | Status: DC
  Administered 2017-08-18 (×3): 50 mg via INTRAVENOUS
  Filled 2017-08-18 (×4): qty 1

## 2017-08-18 MED ORDER — ORAL CARE MOUTH RINSE
15.0000 mL | OROMUCOSAL | Status: DC
  Administered 2017-08-18 (×5): 15 mL via OROMUCOSAL

## 2017-08-18 MED ORDER — DIPHENHYDRAMINE HCL 50 MG/ML IJ SOLN
50.0000 mg | Freq: Every day | INTRAMUSCULAR | Status: DC
  Filled 2017-08-18: qty 1

## 2017-08-18 MED ORDER — ASPIRIN EC 81 MG PO TBEC
81.0000 mg | DELAYED_RELEASE_TABLET | Freq: Every day | ORAL | Status: DC
  Administered 2017-08-19 – 2017-08-21 (×3): 81 mg via ORAL
  Filled 2017-08-18 (×3): qty 1

## 2017-08-18 MED ORDER — FENTANYL CITRATE (PF) 100 MCG/2ML IJ SOLN
25.0000 ug | INTRAMUSCULAR | Status: DC | PRN
  Administered 2017-08-18 – 2017-08-19 (×4): 25 ug via INTRAVENOUS
  Filled 2017-08-18 (×3): qty 2

## 2017-08-18 MED ORDER — EPINEPHRINE PF 1 MG/ML IJ SOLN
0.5000 ug/min | INTRAVENOUS | Status: DC
  Filled 2017-08-18: qty 4

## 2017-08-18 MED ORDER — METHYLPREDNISOLONE SODIUM SUCC 40 MG IJ SOLR
40.0000 mg | Freq: Two times a day (BID) | INTRAMUSCULAR | Status: DC
  Administered 2017-08-18: 40 mg via INTRAVENOUS
  Filled 2017-08-18: qty 1

## 2017-08-18 MED ORDER — RANITIDINE HCL 50 MG/2ML IJ SOLN
50.0000 mg | Freq: Four times a day (QID) | INTRAVENOUS | Status: DC
  Administered 2017-08-18 – 2017-08-19 (×5): 50 mg via INTRAVENOUS
  Filled 2017-08-18 (×8): qty 2

## 2017-08-18 MED ORDER — ACETAMINOPHEN 325 MG PO TABS: 650.0000 mg | ORAL_TABLET | Freq: Four times a day (QID) | ORAL | Status: DC | PRN

## 2017-08-18 MED ORDER — MIDAZOLAM HCL 2 MG/2ML IJ SOLN
2.0000 mg | Freq: Once | INTRAMUSCULAR | Status: AC
  Administered 2017-08-18: 2 mg via INTRAVENOUS

## 2017-08-18 MED ORDER — METHYLPREDNISOLONE SODIUM SUCC 40 MG IJ SOLR
40.0000 mg | Freq: Every day | INTRAMUSCULAR | Status: DC
  Filled 2017-08-18: qty 1

## 2017-08-18 MED ORDER — CHLORHEXIDINE GLUCONATE 0.12% ORAL RINSE (MEDLINE KIT)
15.0000 mL | Freq: Two times a day (BID) | OROMUCOSAL | Status: DC
  Administered 2017-08-18 (×2): 15 mL via OROMUCOSAL

## 2017-08-18 MED ORDER — POLYVINYL ALCOHOL 1.4 % OP SOLN
1.0000 [drp] | OPHTHALMIC | Status: DC | PRN
  Filled 2017-08-18: qty 15

## 2017-08-18 NOTE — Progress Notes (Signed)
12:49 AM 08/18/2017    CVC, aline placement and epi gtt on hold per Renae Fickle. Family aware and at bedside.  Will continue to monitor.   Carlyon Prows RN

## 2017-08-18 NOTE — Procedures (Signed)
Extubation Procedure Note  Patient Details:   Name: Linda Berg DOB: January 16, 1946 MRN: [id number]   Airway Documentation:  Airway 7 mm (Active)  Secured at (cm) 23 cm 08/18/2017 11:50 AM  Measured From Lips 08/18/2017 11:50 AM  Secured Location Center 08/18/2017 11:50 AM  Secured By [employer] 08/18/2017 11:50 AM  Tube Holder Repositioned Yes 08/18/2017 11:50 AM  Cuff Pressure (cm H2O) 26 cm H2O 08/17/2017 11:28 PM  Site Condition Dry 08/18/2017 11:50 AM    Evaluation  O2 sats: stable throughout Complications: No apparent complications Patient did tolerate procedure well. Bilateral Breath Sounds: Clear, Diminished   Yes   Patient extubated per order to 2L [name] with no complications. Positive cuff leak was noted prior to extubation. Patient is alert and oriented to time and place and is able to speak though her voice is weak. Vitals are stable and sats are 98%. RT will continue to monitor.    Preciosa Bundrick Lajuana Ripple 08/18/2017, 2:18 PM

## 2017-08-18 NOTE — Progress Notes (Signed)
Subjective/Chief Complaint: Patient is intubated, sedated, but arousable Hemodynamically stable WBC normal Indicates no abdominal pain   Objective: Vital signs in last 24 hours: Temp:  [95.5 F (35.3 C)-100 F (37.8 C)] 100 F (37.8 C) (01/09 0800) Pulse Rate:  [62-130] 79 (01/09 0800) Resp:  [11-43] 11 (01/09 0800) BP: (75-161)/(53-125) 99/62 (01/09 0800) SpO2:  [68 %-100 %] 97 % (01/09 0800) FiO2 (%):  [40 %-100 %] 40 % (01/09 0739) Weight:  [65.3 kg (144 lb)-66.4 kg (146 lb 6.2 oz)] 66.4 kg (146 lb 6.2 oz) (01/09 0437) Last BM Date: 08/17/17  Intake/Output from previous day: 01/08 0701 - 01/09 0700 In: 2885.5 [I.V.:833.5; IV Piggyback:2052] Out: 1350 [Urine:1350] Intake/Output this shift: Total I/O In: 57.5 [I.V.:7.5; IV Piggyback:50] Out: 200 [Urine:200]  General appearance: intubated, but arousable; interactive GI: distended; non-tender; no palpable masses; some subcutaneous emphysema no signs of peritonitis  Lab Results:  Recent Labs    08/17/17 1815 08/18/17 0325  WBC 8.6 8.0  7.9  HGB 11.5* 12.3  12.4  HCT 36.4 38.2  38.6  PLT 203 211  220   BMET Recent Labs    08/17/17 1815 08/18/17 0325  NA 141 138  K 3.4* 4.0  CL 112* 108  CO2 22 19*  GLUCOSE 127* 126*  BUN 9 8  CREATININE 0.71 0.64  CALCIUM 7.7* 8.2*   PT/INR No results for input(s): LABPROT, INR in the last 72 hours. ABG Recent Labs    08/17/17 2347 08/18/17 0345  PHART 7.282* 7.281*  HCO3 20.2 21.2    Studies/Results: Dg Abdomen 1 View  Result Date: 08/17/2017 CLINICAL DATA:  Assessed gastric tube position. Recent history of colonoscopy necessitating intubation. EXAM: ABDOMEN - 1 VIEW COMPARISON:  None. FINDINGS: The tip and side port of a gastric tube are seen in the left upper quadrant of the abdomen in the expected location of the stomach. Extensive soft tissue emphysema along the included lower thorax and right hemiabdomen is identified with tracking of  pneumomediastinum into the upper abdomen outlining the upper pole of the kidney. Additionally there is pneumoperitoneum with visualization of both sides of bowel (Rigler sign)and outlining portions of the liver. Scoliotic curvature of the lumbar spine convex to the left is noted. Metallic clips project over the mid pelvis. IMPRESSION: 1. Satisfactory gastric tube position with tip and side-port in the expected location of the stomach just beneath the left hemidiaphragm. 2. There is free intraperitoneal air outlining bowel loops as well as dome of the liver. Tracking of pneumomediastinum is seen extending into the upper abdomen outlining portions of the right kidney as well. Overlying soft tissue emphysema along the right hemiabdomen and included lower thorax. These results were called by telephone at the time of interpretation on 08/17/2017 at 6:40 pm to Dr. Pricilla Loveless , who verbally acknowledged these results. Electronically Signed   By: Tollie Eth M.D.   On: 08/17/2017 18:40   Ct Chest W Contrast  Result Date: 08/17/2017 CLINICAL DATA:  Pneumomediastinum. Post EGD and colonoscopy today with possible Largen reaction. EXAM: CT CHEST, ABDOMEN, AND PELVIS WITH CONTRAST TECHNIQUE: Multidetector CT imaging of the chest, abdomen and pelvis was performed following the standard protocol during bolus administration of intravenous contrast. CONTRAST:  ISOVUE-300 IOPAMIDOL (ISOVUE-300) INJECTION 61% COMPARISON:  Chest radiographs earlier this day. Abdominal CT 03/07/2015 FINDINGS: CT CHEST FINDINGS Cardiovascular: Thoracic aorta is normal in caliber. No central pulmonary embolus. Heart is normal in size. No pericardial effusion. Mediastinum/Nodes: Extensive pneumomediastinum throughout the chest.  Endotracheal and enteric tubes in place. No obvious esophageal wall thickening. No enlarged mediastinal or hilar lymph nodes. Lungs/Pleura: Moderate to large left and small to moderate right pneumothorax. Consolidations  dependently within the left greater than right lung and dependent right upper lobe may be aspiration or atelectasis. Musculoskeletal: Extensive subcutaneous emphysema about the chest wall extending into the neck, right breast, and both upper extremities. Scoliosis and degenerative change in the spine. CT ABDOMEN PELVIS FINDINGS Hepatobiliary: No focal hepatic lesion. Gallbladder physiologically distended, no calcified stone. No biliary dilatation. Pancreas: No ductal dilatation or inflammation. Spleen: Normal in size without focal abnormality. Adrenals/Urinary Tract: Normal adrenal glands. Extraperitoneal air tracks in the retroperitoneum about the right kidney. No hydronephrosis or perinephric edema. Homogeneous renal enhancement with symmetric excretion on delayed phase imaging. Simple cyst in the posterior left kidney. Urinary bladder is distended without wall thickening. Stomach/Bowel: Free intraperitoneal and extraperitoneal air in the abdomen and pelvis. In stomach is decompressed with enteric tube in place. No obvious small bowel inflammation. Gaseous distention of the proximal colon from colonoscopy earlier this day, with relatively decompressed descending and sigmoid colon. There are two linear metallic densities within the distal sigmoid colon. Extraperitoneal air adjacent to the rectum and in the perirectal perineal subcutaneous tissues. An obvious bowel defect as source of air is not confidently visualized. Vascular/Lymphatic: Mild aortic atherosclerosis without aneurysm. No obvious gas in the mesenteric vasculature. Calcified mesenteric nodes are unchanged from prior exam. Limited assessment for adenopathy given degree of extraperitoneal air. Reproductive: Uterus and bilateral adnexa are unremarkable. Other: Extensive subcutaneous emphysema about the abdominal wall, greater on the right. Extraperitoneal air visualized in the pelvis, and throughout the retroperitoneum (right greater than left). There is  a small-moderate amount of intraperitoneal air anteriorly. Scattered air throughout the lower mesentery. No definite ascites. Musculoskeletal: Scoliosis and degenerative change in the spine. IMPRESSION: 1. Extensive pneumomediastinum. Moderate to large left and small to moderate right pneumothorax. Extensive chest wall subcutaneous emphysema. 2. Extensive extraperitoneal air in the abdomen and pelvis, greater on the right. Free intraperitoneal air. Extensive subcutaneous emphysema about the abdomen and pelvis. Subcutaneous emphysema in the perineum and perirectal soft tissues. 3. No obvious bowel perforation as cause of abnormal air. Colonic distention likely related to recent colonoscopy. Metallic densities in the distal sigmoid colon are presumed clips from colonoscopy. 4. Unchanged calcified lymph nodes in the small bowel mesentery. Critical Value/emergent results were called by telephone at the time of interpretation on 08/17/2017 at approximately 9:35 pm to Dr. Pricilla Loveless , who verbally acknowledged these results. Electronically Signed   By: Rubye Oaks M.D.   On: 08/17/2017 21:58   Ct Abdomen Pelvis W Contrast  Result Date: 08/17/2017 CLINICAL DATA:  Pneumomediastinum. Post EGD and colonoscopy today with possible Largen reaction. EXAM: CT CHEST, ABDOMEN, AND PELVIS WITH CONTRAST TECHNIQUE: Multidetector CT imaging of the chest, abdomen and pelvis was performed following the standard protocol during bolus administration of intravenous contrast. CONTRAST:  ISOVUE-300 IOPAMIDOL (ISOVUE-300) INJECTION 61% COMPARISON:  Chest radiographs earlier this day. Abdominal CT 03/07/2015 FINDINGS: CT CHEST FINDINGS Cardiovascular: Thoracic aorta is normal in caliber. No central pulmonary embolus. Heart is normal in size. No pericardial effusion. Mediastinum/Nodes: Extensive pneumomediastinum throughout the chest. Endotracheal and enteric tubes in place. No obvious esophageal wall thickening. No enlarged  mediastinal or hilar lymph nodes. Lungs/Pleura: Moderate to large left and small to moderate right pneumothorax. Consolidations dependently within the left greater than right lung and dependent right upper lobe may be aspiration or  atelectasis. Musculoskeletal: Extensive subcutaneous emphysema about the chest wall extending into the neck, right breast, and both upper extremities. Scoliosis and degenerative change in the spine. CT ABDOMEN PELVIS FINDINGS Hepatobiliary: No focal hepatic lesion. Gallbladder physiologically distended, no calcified stone. No biliary dilatation. Pancreas: No ductal dilatation or inflammation. Spleen: Normal in size without focal abnormality. Adrenals/Urinary Tract: Normal adrenal glands. Extraperitoneal air tracks in the retroperitoneum about the right kidney. No hydronephrosis or perinephric edema. Homogeneous renal enhancement with symmetric excretion on delayed phase imaging. Simple cyst in the posterior left kidney. Urinary bladder is distended without wall thickening. Stomach/Bowel: Free intraperitoneal and extraperitoneal air in the abdomen and pelvis. In stomach is decompressed with enteric tube in place. No obvious small bowel inflammation. Gaseous distention of the proximal colon from colonoscopy earlier this day, with relatively decompressed descending and sigmoid colon. There are two linear metallic densities within the distal sigmoid colon. Extraperitoneal air adjacent to the rectum and in the perirectal perineal subcutaneous tissues. An obvious bowel defect as source of air is not confidently visualized. Vascular/Lymphatic: Mild aortic atherosclerosis without aneurysm. No obvious gas in the mesenteric vasculature. Calcified mesenteric nodes are unchanged from prior exam. Limited assessment for adenopathy given degree of extraperitoneal air. Reproductive: Uterus and bilateral adnexa are unremarkable. Other: Extensive subcutaneous emphysema about the abdominal wall, greater on  the right. Extraperitoneal air visualized in the pelvis, and throughout the retroperitoneum (right greater than left). There is a small-moderate amount of intraperitoneal air anteriorly. Scattered air throughout the lower mesentery. No definite ascites. Musculoskeletal: Scoliosis and degenerative change in the spine. IMPRESSION: 1. Extensive pneumomediastinum. Moderate to large left and small to moderate right pneumothorax. Extensive chest wall subcutaneous emphysema. 2. Extensive extraperitoneal air in the abdomen and pelvis, greater on the right. Free intraperitoneal air. Extensive subcutaneous emphysema about the abdomen and pelvis. Subcutaneous emphysema in the perineum and perirectal soft tissues. 3. No obvious bowel perforation as cause of abnormal air. Colonic distention likely related to recent colonoscopy. Metallic densities in the distal sigmoid colon are presumed clips from colonoscopy. 4. Unchanged calcified lymph nodes in the small bowel mesentery. Critical Value/emergent results were called by telephone at the time of interpretation on 08/17/2017 at approximately 9:35 pm to Dr. Pricilla Loveless , who verbally acknowledged these results. Electronically Signed   By: Rubye Oaks M.D.   On: 08/17/2017 21:58   Dg Chest Port 1 View  Result Date: 08/18/2017 CLINICAL DATA:  Hypoxia EXAM: PORTABLE CHEST 1 VIEW COMPARISON:  August 17, 2017 chest radiograph and CT chest and abdomen August 17, 2017 FINDINGS: Endotracheal tube tip is 1.9 cm above the carina. Nasogastric tube tip and side port are in the stomach. There are chest tubes bilaterally. No pneumothorax is appreciable by radiography on the left. There is a small pneumothorax on the right in the apical regions seen radiographically. Widespread pneumomediastinum as well as subcutaneous emphysema and pneumoperitoneum are visualized radiographically, better seen on recent CT. There is patchy consolidation in the left lung base. No new airspace opacity  evident. Heart size normal. Pulmonary vascularity normal. No evident adenopathy. IMPRESSION: Tube and catheter positions as described. Small pneumothorax seen on the right. No pneumothorax seen on the left. Persistent consolidation left lung base. Extensive pneumomediastinum and subcutaneous emphysema noted. Pneumoperitoneum is appreciable, better seen on recent CT. Stable cardiac silhouette. Electronically Signed   By: Bretta Bang III M.D.   On: 08/18/2017 07:16   Dg Chest Port 1 View  Result Date: 08/18/2017 CLINICAL DATA:  Post chest tube  insertion. EXAM: PORTABLE CHEST 1 VIEW COMPARISON:  CT and radiographs earlier this day. FINDINGS: Bilateral pigtail catheters have been placed. Pneumothoraces on CT are not well visualized radiographically detail large amount of overlying subcutaneous emphysema. Endotracheal tube 2.1 cm from the carina.  Enteric tube in place. Extensive pneumomediastinum, subcutaneous emphysema, and free and retroperitoneal air in the upper abdomen, as seen on CT. IMPRESSION: 1. Placement of bilateral pigtail catheters. Bilateral pneumothoraces are not well evaluated radiographically (visualized only on CT). 2. Again seen extensive pneumomediastinum, retroperitoneal and free air in the upper abdomen. Electronically Signed   By: Rubye Oaks M.D.   On: 08/18/2017 00:11   Dg Chest Portable 1 View  Result Date: 08/17/2017 CLINICAL DATA:  Evaluate orogastric tube after repositioning. EXAM: PORTABLE CHEST 1 VIEW COMPARISON:  Immediate preceding chest radiograph performed at 1642 hours FINDINGS: Diffuse subcutaneous emphysema is noted with pneumomediastinum as before. Heart size remains stable. No mediastinal widening. There is aortic atherosclerosis. No conclusive evidence for pneumo thorax though difficult to identify due to the extensive overlying soft tissue emphysema. Low-lying endotracheal tube is again seen with tip at 1.6 cm above the carina. Gastric tube is seen extending  below the left hemidiaphragm however the tip and side-port are still not visualized on repeat imaging. Curvilinear lucency medial to the dome of the right hemidiaphragm may either represent a small focus of atelectasis, continuation of the patient's known pneumomediastinum or potentially a small focus of pneumoperitoneum. Stable dextroconvex curvature of the dorsal spine at the thoracolumbar junction. IMPRESSION: 1. Low-lying endotracheal tube tip 1.6 cm above the carina. 2. Nonvisualization the tip and side port of an indwelling gastric tube. The tube does extend below the left hemidiaphragm into the abdomen. 3. Extensive subcutaneous emphysema as before with pneumomediastinum. Crescentic lucency adjacent to the right hemidiaphragm may represent continuation of the pneumomediastinum, linear atelectasis at the right lung base or possibly tiny pneumoperitoneum. Abdomen radiographs may help for better assessment of the gastric tube as well as for assessment of possible air beneath the diaphragm (ordered at time of dictation). Electronically Signed   By: Tollie Eth M.D.   On: 08/17/2017 18:32   Dg Chest Portable 1 View  Result Date: 08/17/2017 CLINICAL DATA:  Endotracheal and gastric tube placement EXAM: PORTABLE CHEST 1 VIEW COMPARISON:  04/23/2014 FINDINGS: Extensive subcutaneous emphysema is seen at the base of the neck and overlying the thorax as well as upper abdomen. The tip of an endotracheal tube is 1.7 cm above the carina. A gastric tube extends into the expected location of the stomach however the tip and side port are excluded on the current exam. Heart is normal in size. There is aortic atherosclerosis. Pneumomediastinum outlining the aortic arch and left heart border as well as base of heart is seen. Crescentic lucencies are seen at the lung bases that may either be related to the pneumomediastinum although the possibility of small amount of free air is not excluded as well. Dextroconvex curvature is  stable at the thoracolumbar junction. IMPRESSION: 1. Low lying endotracheal tube approximately 1.7 cm above the carina. Pullback at least 2 cm suggested. 2. Gastric tube tip and side port are excluded on the current exam but extends below the left hemidiaphragm. 3. Diffuse extensive subcutaneous emphysema with pneumomediastinum. 4. There are small crescentic lucencies at the base of the lungs which in part likely represents continuation of the pneumomediastinum although the possibility of a tiny amount of free intraperitoneal air beneath the diaphragm is not excluded, especially on  the right. Electronically Signed   By: Tollie Eth M.D.   On: 08/17/2017 17:49    Anti-infectives: Anti-infectives (From admission, onward)   Start     Dose/Rate Route Frequency Ordered Stop   08/18/17 0400  piperacillin-tazobactam (ZOSYN) IVPB 3.375 g     3.375 g 12.5 mL/hr over 240 Minutes Intravenous Every 8 hours 08/17/17 2347     08/17/17 1845  piperacillin-tazobactam (ZOSYN) IVPB 3.375 g     3.375 g 100 mL/hr over 30 Minutes Intravenous  Once 08/17/17 1839 08/17/17 2152      Assessment/Plan: S/p EGD/ colonoscopy complicated by anaphylaxis, bilateral pneumothoraces, pneumoperitoneum, and extensive subcutaneous emphysema  No apparent signs of perforated viscus - no abdominal tenderness, normal WBC, hemodynamically stable Will continue to follow.  LOS: 1 day    Wynona Luna 08/18/2017

## 2017-08-18 NOTE — Progress Notes (Signed)
PULMONARY / CRITICAL CARE MEDICINE   Name: Linda Berg MRN: [id number] DOB: [birth year]    ADMISSION DATE:  08/17/2017 CONSULTATION DATE:  08/17/2017   REFERRING MD:  ED Provider  CHIEF COMPLAINT:  anaphylaxis   SUBJECTIVE:  Pt remains intubated.  Follows commands, no distress, denies abdominal pain.  VITAL SIGNS: BP (!) 95/58   Pulse 68   Temp 100 F (37.8 C)   Resp (!) 21   Ht 5' 1.5" (1.562 m)   Wt 146 lb 6.2 oz (66.4 kg)   SpO2 99%   BMI 27.21 kg/m   HEMODYNAMICS:    VENTILATOR SETTINGS: Vent Mode: PRVC FiO2 (%):  [40 %-100 %] 40 % Set Rate:  [14 bmp-18 bmp] 18 bmp Vt Set:  [400 mL] 400 mL PEEP:  [5 cmH20] 5 cmH20 Plateau Pressure:  [18 cmH20-19 cmH20] 19 cmH20  INTAKE / OUTPUT: I/O last 3 completed shifts: In: 2885.5 [I.V.:833.5; IV Piggyback:2052] Out: 1350 [Urine:1350]  PHYSICAL EXAMINATION: General:  Intubated, NAD Neuro:  Following commands HEENT:  ETT in place, neck swelling Cardiovascular:  RRR, subQ air noted bilaterally Lungs:  Mechanical breath sounds, symmetric rise and fall Abdomen:  Soft, mild distention, no tenderness Musculoskeletal:  No deformities Skin:  Warm, dry, intact  LABS:  BMET Recent Labs  Lab 08/17/17 1815 08/18/17 0325  NA 141 138  K 3.4* 4.0  CL 112* 108  CO2 22 19*  BUN 9 8  CREATININE 0.71 0.64  GLUCOSE 127* 126*    Electrolytes Recent Labs  Lab 08/17/17 1815 08/18/17 0325  CALCIUM 7.7* 8.2*  MG  --  1.8  PHOS  --  4.1    CBC Recent Labs  Lab 08/17/17 1815 08/18/17 0325  WBC 8.6 8.0  7.9  HGB 11.5* 12.3  12.4  HCT 36.4 38.2  38.6  PLT 203 211  220    Coag's No results for input(s): APTT, INR in the last 168 hours.  Sepsis Markers Recent Labs  Lab 08/18/17 0325  LATICACIDVEN 2.0*  PROCALCITON 0.27    ABG Recent Labs  Lab 08/17/17 1740 08/17/17 2347 08/18/17 0345  PHART 7.362 7.282* 7.281*  PCO2ART 34.8 42.7 44.8  PO2ART 150.0* 94.0 93.0    Liver Enzymes Recent Labs   Lab 08/17/17 1815 08/18/17 0325  AST 27 37  ALT 25 30  ALKPHOS 75 77  BILITOT 0.5 0.6  ALBUMIN 3.5 3.6    Cardiac Enzymes Recent Labs  Lab 08/18/17 0325  TROPONINI 1.33*    Glucose Recent Labs  Lab 08/17/17 2349  GLUCAP 125*    Imaging Dg Abdomen 1 View  Result Date: 08/17/2017 CLINICAL DATA:  Assessed gastric tube position. Recent history of colonoscopy necessitating intubation. EXAM: ABDOMEN - 1 VIEW COMPARISON:  None. FINDINGS: The tip and side port of a gastric tube are seen in the left upper quadrant of the abdomen in the expected location of the stomach. Extensive soft tissue emphysema along the included lower thorax and right hemiabdomen is identified with tracking of pneumomediastinum into the upper abdomen outlining the upper pole of the kidney. Additionally there is pneumoperitoneum with visualization of both sides of bowel (Rigler sign)and outlining portions of the liver. Scoliotic curvature of the lumbar spine convex to the left is noted. Metallic clips project over the mid pelvis. IMPRESSION: 1. Satisfactory gastric tube position with tip and side-port in the expected location of the stomach just beneath the left hemidiaphragm. 2. There is free intraperitoneal air outlining bowel loops as well as dome  of the liver. Tracking of pneumomediastinum is seen extending into the upper abdomen outlining portions of the right kidney as well. Overlying soft tissue emphysema along the right hemiabdomen and included lower thorax. These results were called by telephone at the time of interpretation on 08/17/2017 at 6:40 pm to Dr. Pricilla Loveless , who verbally acknowledged these results. Electronically Signed   By: Tollie Eth M.D.   On: 08/17/2017 18:40   Ct Chest W Contrast  Result Date: 08/17/2017 CLINICAL DATA:  Pneumomediastinum. Post EGD and colonoscopy today with possible Largen reaction. EXAM: CT CHEST, ABDOMEN, AND PELVIS WITH CONTRAST TECHNIQUE: Multidetector CT imaging of the  chest, abdomen and pelvis was performed following the standard protocol during bolus administration of intravenous contrast. CONTRAST:  ISOVUE-300 IOPAMIDOL (ISOVUE-300) INJECTION 61% COMPARISON:  Chest radiographs earlier this day. Abdominal CT 03/07/2015 FINDINGS: CT CHEST FINDINGS Cardiovascular: Thoracic aorta is normal in caliber. No central pulmonary embolus. Heart is normal in size. No pericardial effusion. Mediastinum/Nodes: Extensive pneumomediastinum throughout the chest. Endotracheal and enteric tubes in place. No obvious esophageal wall thickening. No enlarged mediastinal or hilar lymph nodes. Lungs/Pleura: Moderate to large left and small to moderate right pneumothorax. Consolidations dependently within the left greater than right lung and dependent right upper lobe may be aspiration or atelectasis. Musculoskeletal: Extensive subcutaneous emphysema about the chest wall extending into the neck, right breast, and both upper extremities. Scoliosis and degenerative change in the spine. CT ABDOMEN PELVIS FINDINGS Hepatobiliary: No focal hepatic lesion. Gallbladder physiologically distended, no calcified stone. No biliary dilatation. Pancreas: No ductal dilatation or inflammation. Spleen: Normal in size without focal abnormality. Adrenals/Urinary Tract: Normal adrenal glands. Extraperitoneal air tracks in the retroperitoneum about the right kidney. No hydronephrosis or perinephric edema. Homogeneous renal enhancement with symmetric excretion on delayed phase imaging. Simple cyst in the posterior left kidney. Urinary bladder is distended without wall thickening. Stomach/Bowel: Free intraperitoneal and extraperitoneal air in the abdomen and pelvis. In stomach is decompressed with enteric tube in place. No obvious small bowel inflammation. Gaseous distention of the proximal colon from colonoscopy earlier this day, with relatively decompressed descending and sigmoid colon. There are two linear metallic  densities within the distal sigmoid colon. Extraperitoneal air adjacent to the rectum and in the perirectal perineal subcutaneous tissues. An obvious bowel defect as source of air is not confidently visualized. Vascular/Lymphatic: Mild aortic atherosclerosis without aneurysm. No obvious gas in the mesenteric vasculature. Calcified mesenteric nodes are unchanged from prior exam. Limited assessment for adenopathy given degree of extraperitoneal air. Reproductive: Uterus and bilateral adnexa are unremarkable. Other: Extensive subcutaneous emphysema about the abdominal wall, greater on the right. Extraperitoneal air visualized in the pelvis, and throughout the retroperitoneum (right greater than left). There is a small-moderate amount of intraperitoneal air anteriorly. Scattered air throughout the lower mesentery. No definite ascites. Musculoskeletal: Scoliosis and degenerative change in the spine. IMPRESSION: 1. Extensive pneumomediastinum. Moderate to large left and small to moderate right pneumothorax. Extensive chest wall subcutaneous emphysema. 2. Extensive extraperitoneal air in the abdomen and pelvis, greater on the right. Free intraperitoneal air. Extensive subcutaneous emphysema about the abdomen and pelvis. Subcutaneous emphysema in the perineum and perirectal soft tissues. 3. No obvious bowel perforation as cause of abnormal air. Colonic distention likely related to recent colonoscopy. Metallic densities in the distal sigmoid colon are presumed clips from colonoscopy. 4. Unchanged calcified lymph nodes in the small bowel mesentery. Critical Value/emergent results were called by telephone at the time of interpretation on 08/17/2017 at approximately 9:35 pm  to Dr. Pricilla Loveless , who verbally acknowledged these results. Electronically Signed   By: Rubye Oaks M.D.   On: 08/17/2017 21:58   Ct Abdomen Pelvis W Contrast  Result Date: 08/17/2017 CLINICAL DATA:  Pneumomediastinum. Post EGD and colonoscopy  today with possible Largen reaction. EXAM: CT CHEST, ABDOMEN, AND PELVIS WITH CONTRAST TECHNIQUE: Multidetector CT imaging of the chest, abdomen and pelvis was performed following the standard protocol during bolus administration of intravenous contrast. CONTRAST:  ISOVUE-300 IOPAMIDOL (ISOVUE-300) INJECTION 61% COMPARISON:  Chest radiographs earlier this day. Abdominal CT 03/07/2015 FINDINGS: CT CHEST FINDINGS Cardiovascular: Thoracic aorta is normal in caliber. No central pulmonary embolus. Heart is normal in size. No pericardial effusion. Mediastinum/Nodes: Extensive pneumomediastinum throughout the chest. Endotracheal and enteric tubes in place. No obvious esophageal wall thickening. No enlarged mediastinal or hilar lymph nodes. Lungs/Pleura: Moderate to large left and small to moderate right pneumothorax. Consolidations dependently within the left greater than right lung and dependent right upper lobe may be aspiration or atelectasis. Musculoskeletal: Extensive subcutaneous emphysema about the chest wall extending into the neck, right breast, and both upper extremities. Scoliosis and degenerative change in the spine. CT ABDOMEN PELVIS FINDINGS Hepatobiliary: No focal hepatic lesion. Gallbladder physiologically distended, no calcified stone. No biliary dilatation. Pancreas: No ductal dilatation or inflammation. Spleen: Normal in size without focal abnormality. Adrenals/Urinary Tract: Normal adrenal glands. Extraperitoneal air tracks in the retroperitoneum about the right kidney. No hydronephrosis or perinephric edema. Homogeneous renal enhancement with symmetric excretion on delayed phase imaging. Simple cyst in the posterior left kidney. Urinary bladder is distended without wall thickening. Stomach/Bowel: Free intraperitoneal and extraperitoneal air in the abdomen and pelvis. In stomach is decompressed with enteric tube in place. No obvious small bowel inflammation. Gaseous distention of the proximal  colon from colonoscopy earlier this day, with relatively decompressed descending and sigmoid colon. There are two linear metallic densities within the distal sigmoid colon. Extraperitoneal air adjacent to the rectum and in the perirectal perineal subcutaneous tissues. An obvious bowel defect as source of air is not confidently visualized. Vascular/Lymphatic: Mild aortic atherosclerosis without aneurysm. No obvious gas in the mesenteric vasculature. Calcified mesenteric nodes are unchanged from prior exam. Limited assessment for adenopathy given degree of extraperitoneal air. Reproductive: Uterus and bilateral adnexa are unremarkable. Other: Extensive subcutaneous emphysema about the abdominal wall, greater on the right. Extraperitoneal air visualized in the pelvis, and throughout the retroperitoneum (right greater than left). There is a small-moderate amount of intraperitoneal air anteriorly. Scattered air throughout the lower mesentery. No definite ascites. Musculoskeletal: Scoliosis and degenerative change in the spine. IMPRESSION: 1. Extensive pneumomediastinum. Moderate to large left and small to moderate right pneumothorax. Extensive chest wall subcutaneous emphysema. 2. Extensive extraperitoneal air in the abdomen and pelvis, greater on the right. Free intraperitoneal air. Extensive subcutaneous emphysema about the abdomen and pelvis. Subcutaneous emphysema in the perineum and perirectal soft tissues. 3. No obvious bowel perforation as cause of abnormal air. Colonic distention likely related to recent colonoscopy. Metallic densities in the distal sigmoid colon are presumed clips from colonoscopy. 4. Unchanged calcified lymph nodes in the small bowel mesentery. Critical Value/emergent results were called by telephone at the time of interpretation on 08/17/2017 at approximately 9:35 pm to Dr. Pricilla Loveless , who verbally acknowledged these results. Electronically Signed   By: Rubye Oaks M.D.   On:  08/17/2017 21:58   Dg Chest Port 1 View  Result Date: 08/18/2017 CLINICAL DATA:  Hypoxia EXAM: PORTABLE CHEST 1 VIEW COMPARISON:  August 17, 2017 chest radiograph and CT chest and abdomen August 17, 2017 FINDINGS: Endotracheal tube tip is 1.9 cm above the carina. Nasogastric tube tip and side port are in the stomach. There are chest tubes bilaterally. No pneumothorax is appreciable by radiography on the left. There is a small pneumothorax on the right in the apical regions seen radiographically. Widespread pneumomediastinum as well as subcutaneous emphysema and pneumoperitoneum are visualized radiographically, better seen on recent CT. There is patchy consolidation in the left lung base. No new airspace opacity evident. Heart size normal. Pulmonary vascularity normal. No evident adenopathy. IMPRESSION: Tube and catheter positions as described. Small pneumothorax seen on the right. No pneumothorax seen on the left. Persistent consolidation left lung base. Extensive pneumomediastinum and subcutaneous emphysema noted. Pneumoperitoneum is appreciable, better seen on recent CT. Stable cardiac silhouette. Electronically Signed   By: Bretta Bang III M.D.   On: 08/18/2017 07:16   Dg Chest Port 1 View  Result Date: 08/18/2017 CLINICAL DATA:  Post chest tube insertion. EXAM: PORTABLE CHEST 1 VIEW COMPARISON:  CT and radiographs earlier this day. FINDINGS: Bilateral pigtail catheters have been placed. Pneumothoraces on CT are not well visualized radiographically detail large amount of overlying subcutaneous emphysema. Endotracheal tube 2.1 cm from the carina.  Enteric tube in place. Extensive pneumomediastinum, subcutaneous emphysema, and free and retroperitoneal air in the upper abdomen, as seen on CT. IMPRESSION: 1. Placement of bilateral pigtail catheters. Bilateral pneumothoraces are not well evaluated radiographically (visualized only on CT). 2. Again seen extensive pneumomediastinum, retroperitoneal and free  air in the upper abdomen. Electronically Signed   By: Rubye Oaks M.D.   On: 08/18/2017 00:11   Dg Chest Portable 1 View  Result Date: 08/17/2017 CLINICAL DATA:  Evaluate orogastric tube after repositioning. EXAM: PORTABLE CHEST 1 VIEW COMPARISON:  Immediate preceding chest radiograph performed at 1642 hours FINDINGS: Diffuse subcutaneous emphysema is noted with pneumomediastinum as before. Heart size remains stable. No mediastinal widening. There is aortic atherosclerosis. No conclusive evidence for pneumo thorax though difficult to identify due to the extensive overlying soft tissue emphysema. Low-lying endotracheal tube is again seen with tip at 1.6 cm above the carina. Gastric tube is seen extending below the left hemidiaphragm however the tip and side-port are still not visualized on repeat imaging. Curvilinear lucency medial to the dome of the right hemidiaphragm may either represent a small focus of atelectasis, continuation of the patient's known pneumomediastinum or potentially a small focus of pneumoperitoneum. Stable dextroconvex curvature of the dorsal spine at the thoracolumbar junction. IMPRESSION: 1. Low-lying endotracheal tube tip 1.6 cm above the carina. 2. Nonvisualization the tip and side port of an indwelling gastric tube. The tube does extend below the left hemidiaphragm into the abdomen. 3. Extensive subcutaneous emphysema as before with pneumomediastinum. Crescentic lucency adjacent to the right hemidiaphragm may represent continuation of the pneumomediastinum, linear atelectasis at the right lung base or possibly tiny pneumoperitoneum. Abdomen radiographs may help for better assessment of the gastric tube as well as for assessment of possible air beneath the diaphragm (ordered at time of dictation). Electronically Signed   By: Tollie Eth M.D.   On: 08/17/2017 18:32   Dg Chest Portable 1 View  Result Date: 08/17/2017 CLINICAL DATA:  Endotracheal and gastric tube placement EXAM:  PORTABLE CHEST 1 VIEW COMPARISON:  04/23/2014 FINDINGS: Extensive subcutaneous emphysema is seen at the base of the neck and overlying the thorax as well as upper abdomen. The tip of an endotracheal tube is 1.7 cm above  the carina. A gastric tube extends into the expected location of the stomach however the tip and side port are excluded on the current exam. Heart is normal in size. There is aortic atherosclerosis. Pneumomediastinum outlining the aortic arch and left heart border as well as base of heart is seen. Crescentic lucencies are seen at the lung bases that may either be related to the pneumomediastinum although the possibility of small amount of free air is not excluded as well. Dextroconvex curvature is stable at the thoracolumbar junction. IMPRESSION: 1. Low lying endotracheal tube approximately 1.7 cm above the carina. Pullback at least 2 cm suggested. 2. Gastric tube tip and side port are excluded on the current exam but extends below the left hemidiaphragm. 3. Diffuse extensive subcutaneous emphysema with pneumomediastinum. 4. There are small crescentic lucencies at the base of the lungs which in part likely represents continuation of the pneumomediastinum although the possibility of a tiny amount of free intraperitoneal air beneath the diaphragm is not excluded, especially on the right. Electronically Signed   By: Tollie Eth M.D.   On: 08/17/2017 17:49     STUDIES:  n/a  CULTURES: n/a  ANTIBIOTICS: Zosyn 1/8 >> 1/9  SIGNIFICANT EVENTS: 1/8 intubated s/p EGD and colonoscopy due to concern for anaphylaxis  LINES/TUBES: 1/8 ETT  DISCUSSION: This is a 72 year old female, who is suspected to have experienced propofol-induced anaphylaxis/angioedema. This appears to have promptly improved with treatment (Benadryl, Decadron, epinephrine). However, her clinical course has been complicated by development of subcutaneous emphysema and pneumomediastinum and pneumoperitoneum. The patient's  clinical status will need to be followed closely, as it is unclear how her free air was introduced.   ASSESSMENT / PLAN:  PULMONARY A: Pneumomediastinum Subcutaneous Emphysema Respiratory Failure requiring intubation Bilateral pneumothorax s/p chest tube placement without air leak this morning Concern for anaphylaxis P:   Vent support this morning on PS/CPAP.  Will attempt extubation this afternoon with anesthesia on standby Maintain chest tubes Continue H1 and H2 blocker Continue steroids   CARDIOVASCULAR A:  Elevated troponin HLD Low BP P:  EKG non-ischemic Trend troponin ASA NS at 125/hr Statin once tolerating PO  RENAL A:   NAGMA, mild P:   Follow BMET Consider transitioning fluids from NS  GASTROINTESTINAL A:   GI PPx GERD P:   H2 blocker  HEMATOLOGIC A:   No acute issues P:    INFECTIOUS A:   CBC normal, afebrile, no other infectious issues P:   DC Zosyn  ENDOCRINE A:   Hypothyroidism RA    P:   Resume home meds once taking PO  NEUROLOGIC A:   No acute issues P:   RASS goal: -1 Fentanyl and Versed   FAMILY  - Updates: updated today at the bedside  - Inter-disciplinary family meet or Palliative Care meeting due by:  day 7    Pulmonary and Critical Care Medicine [facility] Pager: [phone]  08/18/2017, 7:19 AM

## 2017-08-18 NOTE — Progress Notes (Signed)
50 mls Fentanyl wasted and witnessed By Stark Jock

## 2017-08-18 NOTE — Progress Notes (Signed)
Cuff leak test performed per Dr. Vassie Loll. Patient was able to breathe around deflated cuff and Dr. Vassie Loll made aware.

## 2017-08-18 NOTE — Progress Notes (Addendum)
[facility] ROUNDING NOTE   Subjective: Extubated. Denies any abdominal pain. She has chest discomfort at site of chest tube placement.   Objective: Vital signs in last 24 hours: Temp:  [97.5 F (36.4 C)-100.2 F (37.9 C)] 100.2 F (37.9 C) (01/09 1800) Pulse Rate:  [63-115] 93 (01/09 1800) Resp:  [11-43] 26 (01/09 1800) BP: (75-127)/(53-83) 113/76 (01/09 1800) SpO2:  [95 %-100 %] 95 % (01/09 1800) FiO2 (%):  [32 %-40 %] 32 % (01/09 1418) Weight:  [146 lb 6.2 oz (66.4 kg)] 146 lb 6.2 oz (66.4 kg) (01/09 0437) Last BM Date: 08/17/17  AOx3 General: NAD, subcutaneous crepitus in neck, anterior chest and arms Abdomen: soft, non tender, mild distension and tympanic anteriorly    Intake/Output from previous day: 01/08 0701 - 01/09 0700 In: 2885.5 [I.V.:833.5; IV Piggyback:2052] Out: 1350 [Urine:1350] Intake/Output this shift: Total I/O In: 932.5 [I.V.:777.5; IV Piggyback:155] Out: 1045 [Urine:1025; Chest Tube:20]   Lab Results: Recent Labs    08/17/17 1815 08/18/17 0325  WBC 8.6 8.0  7.9  HGB 11.5* 12.3  12.4  PLT 203 211  220  MCV 94.3 94.8  94.6   BMET Recent Labs    08/17/17 1815 08/18/17 0325  NA 141 138  K 3.4* 4.0  CL 112* 108  CO2 22 19*  GLUCOSE 127* 126*  BUN 9 8  CREATININE 0.71 0.64  CALCIUM 7.7* 8.2*   LFT Recent Labs    08/17/17 1815 08/18/17 0325  PROT 5.8* 6.4*  ALBUMIN 3.5 3.6  AST 27 37  ALT 25 30  ALKPHOS 75 77  BILITOT 0.5 0.6   PT/INR No results for input(s): INR in the last 72 hours.    Imaging/Other results: Dg Abdomen 1 View  Result Date: 08/17/2017 CLINICAL DATA:  Assessed gastric tube position. Recent history of colonoscopy necessitating intubation. EXAM: ABDOMEN - 1 VIEW COMPARISON:  None. FINDINGS: The tip and side port of a gastric tube are seen in the left upper quadrant of the abdomen in the expected location of the stomach. Extensive soft tissue emphysema along the included lower thorax and right  hemiabdomen is identified with tracking of pneumomediastinum into the upper abdomen outlining the upper pole of the kidney. Additionally there is pneumoperitoneum with visualization of both sides of bowel (Rigler sign)and outlining portions of the liver. Scoliotic curvature of the lumbar spine convex to the left is noted. Metallic clips project over the mid pelvis. IMPRESSION: 1. Satisfactory gastric tube position with tip and side-port in the expected location of the stomach just beneath the left hemidiaphragm. 2. There is free intraperitoneal air outlining bowel loops as well as dome of the liver. Tracking of pneumomediastinum is seen extending into the upper abdomen outlining portions of the right kidney as well. Overlying soft tissue emphysema along the right hemiabdomen and included lower thorax. These results were called by telephone at the time of interpretation on 08/17/2017 at 6:40 pm to Dr. Pricilla Loveless , who verbally acknowledged these results. Electronically Signed   By: Tollie Eth M.D.   On: 08/17/2017 18:40   Ct Chest W Contrast  Result Date: 08/17/2017 CLINICAL DATA:  Pneumomediastinum. Post EGD and colonoscopy today with possible Largen reaction. EXAM: CT CHEST, ABDOMEN, AND PELVIS WITH CONTRAST TECHNIQUE: Multidetector CT imaging of the chest, abdomen and pelvis was performed following the standard protocol during bolus administration of intravenous contrast. CONTRAST:  ISOVUE-300 IOPAMIDOL (ISOVUE-300) INJECTION 61% COMPARISON:  Chest radiographs earlier this day. Abdominal CT 03/07/2015 FINDINGS: CT CHEST FINDINGS  Cardiovascular: Thoracic aorta is normal in caliber. No central pulmonary embolus. Heart is normal in size. No pericardial effusion. Mediastinum/Nodes: Extensive pneumomediastinum throughout the chest. Endotracheal and enteric tubes in place. No obvious esophageal wall thickening. No enlarged mediastinal or hilar lymph nodes. Lungs/Pleura: Moderate to large left and small to  moderate right pneumothorax. Consolidations dependently within the left greater than right lung and dependent right upper lobe may be aspiration or atelectasis. Musculoskeletal: Extensive subcutaneous emphysema about the chest wall extending into the neck, right breast, and both upper extremities. Scoliosis and degenerative change in the spine. CT ABDOMEN PELVIS FINDINGS Hepatobiliary: No focal hepatic lesion. Gallbladder physiologically distended, no calcified stone. No biliary dilatation. Pancreas: No ductal dilatation or inflammation. Spleen: Normal in size without focal abnormality. Adrenals/Urinary Tract: Normal adrenal glands. Extraperitoneal air tracks in the retroperitoneum about the right kidney. No hydronephrosis or perinephric edema. Homogeneous renal enhancement with symmetric excretion on delayed phase imaging. Simple cyst in the posterior left kidney. Urinary bladder is distended without wall thickening. Stomach/Bowel: Free intraperitoneal and extraperitoneal air in the abdomen and pelvis. In stomach is decompressed with enteric tube in place. No obvious small bowel inflammation. Gaseous distention of the proximal colon from colonoscopy earlier this day, with relatively decompressed descending and sigmoid colon. There are two linear metallic densities within the distal sigmoid colon. Extraperitoneal air adjacent to the rectum and in the perirectal perineal subcutaneous tissues. An obvious bowel defect as source of air is not confidently visualized. Vascular/Lymphatic: Mild aortic atherosclerosis without aneurysm. No obvious gas in the mesenteric vasculature. Calcified mesenteric nodes are unchanged from prior exam. Limited assessment for adenopathy given degree of extraperitoneal air. Reproductive: Uterus and bilateral adnexa are unremarkable. Other: Extensive subcutaneous emphysema about the abdominal wall, greater on the right. Extraperitoneal air visualized in the pelvis, and throughout the  retroperitoneum (right greater than left). There is a small-moderate amount of intraperitoneal air anteriorly. Scattered air throughout the lower mesentery. No definite ascites. Musculoskeletal: Scoliosis and degenerative change in the spine. IMPRESSION: 1. Extensive pneumomediastinum. Moderate to large left and small to moderate right pneumothorax. Extensive chest wall subcutaneous emphysema. 2. Extensive extraperitoneal air in the abdomen and pelvis, greater on the right. Free intraperitoneal air. Extensive subcutaneous emphysema about the abdomen and pelvis. Subcutaneous emphysema in the perineum and perirectal soft tissues. 3. No obvious bowel perforation as cause of abnormal air. Colonic distention likely related to recent colonoscopy. Metallic densities in the distal sigmoid colon are presumed clips from colonoscopy. 4. Unchanged calcified lymph nodes in the small bowel mesentery. Critical Value/emergent results were called by telephone at the time of interpretation on 08/17/2017 at approximately 9:35 pm to Dr. Pricilla Loveless , who verbally acknowledged these results. Electronically Signed   By: Rubye Oaks M.D.   On: 08/17/2017 21:58   Ct Abdomen Pelvis W Contrast  Result Date: 08/17/2017 CLINICAL DATA:  Pneumomediastinum. Post EGD and colonoscopy today with possible Largen reaction. EXAM: CT CHEST, ABDOMEN, AND PELVIS WITH CONTRAST TECHNIQUE: Multidetector CT imaging of the chest, abdomen and pelvis was performed following the standard protocol during bolus administration of intravenous contrast. CONTRAST:  ISOVUE-300 IOPAMIDOL (ISOVUE-300) INJECTION 61% COMPARISON:  Chest radiographs earlier this day. Abdominal CT 03/07/2015 FINDINGS: CT CHEST FINDINGS Cardiovascular: Thoracic aorta is normal in caliber. No central pulmonary embolus. Heart is normal in size. No pericardial effusion. Mediastinum/Nodes: Extensive pneumomediastinum throughout the chest. Endotracheal and enteric tubes in place. No  obvious esophageal wall thickening. No enlarged mediastinal or hilar lymph nodes. Lungs/Pleura: Moderate to large  left and small to moderate right pneumothorax. Consolidations dependently within the left greater than right lung and dependent right upper lobe may be aspiration or atelectasis. Musculoskeletal: Extensive subcutaneous emphysema about the chest wall extending into the neck, right breast, and both upper extremities. Scoliosis and degenerative change in the spine. CT ABDOMEN PELVIS FINDINGS Hepatobiliary: No focal hepatic lesion. Gallbladder physiologically distended, no calcified stone. No biliary dilatation. Pancreas: No ductal dilatation or inflammation. Spleen: Normal in size without focal abnormality. Adrenals/Urinary Tract: Normal adrenal glands. Extraperitoneal air tracks in the retroperitoneum about the right kidney. No hydronephrosis or perinephric edema. Homogeneous renal enhancement with symmetric excretion on delayed phase imaging. Simple cyst in the posterior left kidney. Urinary bladder is distended without wall thickening. Stomach/Bowel: Free intraperitoneal and extraperitoneal air in the abdomen and pelvis. In stomach is decompressed with enteric tube in place. No obvious small bowel inflammation. Gaseous distention of the proximal colon from colonoscopy earlier this day, with relatively decompressed descending and sigmoid colon. There are two linear metallic densities within the distal sigmoid colon. Extraperitoneal air adjacent to the rectum and in the perirectal perineal subcutaneous tissues. An obvious bowel defect as source of air is not confidently visualized. Vascular/Lymphatic: Mild aortic atherosclerosis without aneurysm. No obvious gas in the mesenteric vasculature. Calcified mesenteric nodes are unchanged from prior exam. Limited assessment for adenopathy given degree of extraperitoneal air. Reproductive: Uterus and bilateral adnexa are unremarkable. Other: Extensive subcutaneous  emphysema about the abdominal wall, greater on the right. Extraperitoneal air visualized in the pelvis, and throughout the retroperitoneum (right greater than left). There is a small-moderate amount of intraperitoneal air anteriorly. Scattered air throughout the lower mesentery. No definite ascites. Musculoskeletal: Scoliosis and degenerative change in the spine. IMPRESSION: 1. Extensive pneumomediastinum. Moderate to large left and small to moderate right pneumothorax. Extensive chest wall subcutaneous emphysema. 2. Extensive extraperitoneal air in the abdomen and pelvis, greater on the right. Free intraperitoneal air. Extensive subcutaneous emphysema about the abdomen and pelvis. Subcutaneous emphysema in the perineum and perirectal soft tissues. 3. No obvious bowel perforation as cause of abnormal air. Colonic distention likely related to recent colonoscopy. Metallic densities in the distal sigmoid colon are presumed clips from colonoscopy. 4. Unchanged calcified lymph nodes in the small bowel mesentery. Critical Value/emergent results were called by telephone at the time of interpretation on 08/17/2017 at approximately 9:35 pm to Dr. Pricilla Loveless , who verbally acknowledged these results. Electronically Signed   By: Rubye Oaks M.D.   On: 08/17/2017 21:58   Dg Chest Port 1 View  Result Date: 08/18/2017 CLINICAL DATA:  Hypoxia EXAM: PORTABLE CHEST 1 VIEW COMPARISON:  August 17, 2017 chest radiograph and CT chest and abdomen August 17, 2017 FINDINGS: Endotracheal tube tip is 1.9 cm above the carina. Nasogastric tube tip and side port are in the stomach. There are chest tubes bilaterally. No pneumothorax is appreciable by radiography on the left. There is a small pneumothorax on the right in the apical regions seen radiographically. Widespread pneumomediastinum as well as subcutaneous emphysema and pneumoperitoneum are visualized radiographically, better seen on recent CT. There is patchy consolidation in  the left lung base. No new airspace opacity evident. Heart size normal. Pulmonary vascularity normal. No evident adenopathy. IMPRESSION: Tube and catheter positions as described. Small pneumothorax seen on the right. No pneumothorax seen on the left. Persistent consolidation left lung base. Extensive pneumomediastinum and subcutaneous emphysema noted. Pneumoperitoneum is appreciable, better seen on recent CT. Stable cardiac silhouette. Electronically Signed   By: Chrissie Noa  Margarita Grizzle III M.D.   On: 08/18/2017 07:16   Dg Chest Port 1 View  Result Date: 08/18/2017 CLINICAL DATA:  Post chest tube insertion. EXAM: PORTABLE CHEST 1 VIEW COMPARISON:  CT and radiographs earlier this day. FINDINGS: Bilateral pigtail catheters have been placed. Pneumothoraces on CT are not well visualized radiographically detail large amount of overlying subcutaneous emphysema. Endotracheal tube 2.1 cm from the carina.  Enteric tube in place. Extensive pneumomediastinum, subcutaneous emphysema, and free and retroperitoneal air in the upper abdomen, as seen on CT. IMPRESSION: 1. Placement of bilateral pigtail catheters. Bilateral pneumothoraces are not well evaluated radiographically (visualized only on CT). 2. Again seen extensive pneumomediastinum, retroperitoneal and free air in the upper abdomen. Electronically Signed   By: Rubye Oaks M.D.   On: 08/18/2017 00:11   Dg Chest Portable 1 View  Result Date: 08/17/2017 CLINICAL DATA:  Evaluate orogastric tube after repositioning. EXAM: PORTABLE CHEST 1 VIEW COMPARISON:  Immediate preceding chest radiograph performed at 1642 hours FINDINGS: Diffuse subcutaneous emphysema is noted with pneumomediastinum as before. Heart size remains stable. No mediastinal widening. There is aortic atherosclerosis. No conclusive evidence for pneumo thorax though difficult to identify due to the extensive overlying soft tissue emphysema. Low-lying endotracheal tube is again seen with tip at 1.6 cm above  the carina. Gastric tube is seen extending below the left hemidiaphragm however the tip and side-port are still not visualized on repeat imaging. Curvilinear lucency medial to the dome of the right hemidiaphragm may either represent a small focus of atelectasis, continuation of the patient's known pneumomediastinum or potentially a small focus of pneumoperitoneum. Stable dextroconvex curvature of the dorsal spine at the thoracolumbar junction. IMPRESSION: 1. Low-lying endotracheal tube tip 1.6 cm above the carina. 2. Nonvisualization the tip and side port of an indwelling gastric tube. The tube does extend below the left hemidiaphragm into the abdomen. 3. Extensive subcutaneous emphysema as before with pneumomediastinum. Crescentic lucency adjacent to the right hemidiaphragm may represent continuation of the pneumomediastinum, linear atelectasis at the right lung base or possibly tiny pneumoperitoneum. Abdomen radiographs may help for better assessment of the gastric tube as well as for assessment of possible air beneath the diaphragm (ordered at time of dictation). Electronically Signed   By: Tollie Eth M.D.   On: 08/17/2017 18:32   Dg Chest Portable 1 View  Result Date: 08/17/2017 CLINICAL DATA:  Endotracheal and gastric tube placement EXAM: PORTABLE CHEST 1 VIEW COMPARISON:  04/23/2014 FINDINGS: Extensive subcutaneous emphysema is seen at the base of the neck and overlying the thorax as well as upper abdomen. The tip of an endotracheal tube is 1.7 cm above the carina. A gastric tube extends into the expected location of the stomach however the tip and side port are excluded on the current exam. Heart is normal in size. There is aortic atherosclerosis. Pneumomediastinum outlining the aortic arch and left heart border as well as base of heart is seen. Crescentic lucencies are seen at the lung bases that may either be related to the pneumomediastinum although the possibility of small amount of free air is not  excluded as well. Dextroconvex curvature is stable at the thoracolumbar junction. IMPRESSION: 1. Low lying endotracheal tube approximately 1.7 cm above the carina. Pullback at least 2 cm suggested. 2. Gastric tube tip and side port are excluded on the current exam but extends below the left hemidiaphragm. 3. Diffuse extensive subcutaneous emphysema with pneumomediastinum. 4. There are small crescentic lucencies at the base of the lungs which in part  likely represents continuation of the pneumomediastinum although the possibility of a tiny amount of free intraperitoneal air beneath the diaphragm is not excluded, especially on the right. Electronically Signed   By: Tollie Eth M.D.   On: 08/17/2017 17:49      Assessment &Plan  71 yr F s/p EGD and colonoscopy for evaluation of dysphagia and colorectal cancer screening, post procedure complicated by stridor and facial edema, was emergently intubated.   She has subcutaneous emphysema, bilateral pneumothorax, is improving s/p chest tube placement and also has pneumoperitoneum.  No obvious bowel perforation based on CT chest, abd & pelvis No peritoneal signs or leucocytosis  Tolerated breathing trial and extubated successfully this afternooon   Dr Vassie Loll is planning to DC chest tubes tomorrow if has improvement of pneumothorax. ?unclear etiology for pneumothorax and subcutaneous emphysema Ok to start clears tomorrow with small sips from GI perspective and advance slowly as tolerated Continue broad spectrum antibiotics for 7 days Appreciate supportive care by CCM        Iona Beard , MD [phone] Mon-Fri 8a-5p [phone] after 5p, weekends, holidays  Gastroenterology

## 2017-08-19 ENCOUNTER — Other Ambulatory Visit: Payer: Self-pay

## 2017-08-19 ENCOUNTER — Inpatient Hospital Stay (HOSPITAL_COMMUNITY): Payer: Medicare Other

## 2017-08-19 DIAGNOSIS — K668 Other specified disorders of peritoneum: Secondary | ICD-10-CM

## 2017-08-19 DIAGNOSIS — J982 Interstitial emphysema: Secondary | ICD-10-CM

## 2017-08-19 LAB — BASIC METABOLIC PANEL
Anion gap: 8 (ref 5–15)
BUN: 14 mg/dL (ref 6–20)
CALCIUM: 8.4 mg/dL — AB (ref 8.9–10.3)
CO2: 22 mmol/L (ref 22–32)
CREATININE: 0.78 mg/dL (ref 0.44–1.00)
Chloride: 108 mmol/L (ref 101–111)
GFR calc Af Amer: >60 mL/min (ref >60)
GFR calc non Af Amer: >60 mL/min (ref >60)
GLUCOSE: 86 mg/dL (ref 65–99)
Potassium: 3.6 mmol/L (ref 3.5–5.1)
Sodium: 138 mmol/L (ref 135–145)

## 2017-08-19 LAB — CBC
HCT: 32.9 % — ABNORMAL LOW (ref 36.0–46.0)
Hemoglobin: 10.7 g/dL — ABNORMAL LOW (ref 12.0–15.0)
MCH: 31 pg (ref 26.0–34.0)
MCHC: 32.5 g/dL (ref 30.0–36.0)
MCV: 95.4 fL (ref 78.0–100.0)
Platelets: 167 10*3/uL (ref 150–400)
RBC: 3.45 MIL/uL — ABNORMAL LOW (ref 3.87–5.11)
RDW: 14.4 % (ref 11.5–15.5)
WBC: 5.9 10*3/uL (ref 4.0–10.5)

## 2017-08-19 LAB — PROCALCITONIN: Procalcitonin: 0.3 ng/mL

## 2017-08-19 MED ORDER — HYDROXYCHLOROQUINE SULFATE 200 MG PO TABS
200.0000 mg | ORAL_TABLET | Freq: Every day | ORAL | Status: DC
  Administered 2017-08-20 – 2017-08-21 (×2): 200 mg via ORAL
  Filled 2017-08-19 (×2): qty 1

## 2017-08-19 MED ORDER — PIPERACILLIN-TAZOBACTAM 3.375 G IVPB
3.3750 g | Freq: Three times a day (TID) | INTRAVENOUS | Status: DC
  Administered 2017-08-19 – 2017-08-20 (×3): 3.375 g via INTRAVENOUS
  Filled 2017-08-19 (×4): qty 50

## 2017-08-19 MED ORDER — FAMOTIDINE 20 MG PO TABS: 20.0000 mg | ORAL_TABLET | Freq: Every day | ORAL | Status: DC

## 2017-08-19 MED ORDER — THYROID 60 MG PO TABS
60.0000 mg | ORAL_TABLET | Freq: Every day | ORAL | Status: DC
  Administered 2017-08-19 – 2017-08-21 (×3): 60 mg via ORAL
  Filled 2017-08-19 (×4): qty 1

## 2017-08-19 MED ORDER — OXYCODONE-ACETAMINOPHEN 5-325 MG PO TABS
1.0000 | ORAL_TABLET | ORAL | Status: DC | PRN
  Administered 2017-08-19: 1 via ORAL
  Filled 2017-08-19: qty 1

## 2017-08-19 MED ORDER — PRAVASTATIN SODIUM 20 MG PO TABS
20.0000 mg | ORAL_TABLET | Freq: Every day | ORAL | Status: DC
Start: 2017-08-20 — End: 2017-08-21
  Administered 2017-08-20: 20 mg via ORAL
  Filled 2017-08-19: qty 1

## 2017-08-19 MED ORDER — PANTOPRAZOLE SODIUM 20 MG PO TBEC
20.0000 mg | DELAYED_RELEASE_TABLET | Freq: Every day | ORAL | Status: DC
  Administered 2017-08-20 – 2017-08-21 (×2): 20 mg via ORAL
  Filled 2017-08-19 (×2): qty 1

## 2017-08-19 NOTE — Evaluation (Signed)
Physical Therapy Evaluation Patient Details Name: Linda Berg MRN: [id number] DOB: [birth year] Today's Date: 08/19/2017   History of Present Illness  Pt is a 72 y/o female presenting with anaphylaxis following a routine, elective EGD. Pt required intubated from 1/8 to 1/9 as well as bilateral chest tubes (d/c'd on 1/10). PMH including but not limited to hypothyroidism.    Clinical Impression  Pt presented supine in bed with HOB elevated, awake and willing to participate in therapy session. Prior to admission, pt reported that she was independent with all functional mobility and ADLs. Pt ambulated in hallway with min guard for safety without use of an AD. Pt on RA throughout with all VSS. Pt would continue to benefit from skilled physical therapy services at this time while admitted and after d/c to address the below listed limitations in order to improve overall safety and independence with functional mobility.      Follow Up Recommendations Home health PT;Supervision/Assistance - 24 hour    Equipment Recommendations  None recommended by PT    Recommendations for Other Services       Precautions / Restrictions Precautions Precautions: Fall Restrictions Weight Bearing Restrictions: No      Mobility  Bed Mobility Overal bed mobility: Needs Assistance Bed Mobility: Supine to Sit     Supine to sit: Min assist     General bed mobility comments: increased time and effort, assist to elevate trunk  Transfers Overall transfer level: Needs assistance Equipment used: None Transfers: Sit to/from Stand Sit to Stand: Min guard         General transfer comment: min guard for safety without use of an AD  Ambulation/Gait Ambulation/Gait assistance: Min guard Ambulation Distance (Feet): 50 Feet Assistive device: 1 person hand held assist Gait Pattern/deviations: Step-through pattern;Decreased step length - right;Decreased step length - left;Decreased stride length Gait  velocity: decreased Gait velocity interpretation: Below normal speed for age/gender General Gait Details: mild instability but no overt LOB or need for physical assistance without use of an AD  Stairs            Wheelchair Mobility    Modified Rankin (Stroke Patients Only)       Balance Overall balance assessment: Needs assistance Sitting-balance support: Feet supported Sitting balance-Leahy Scale: Good     Standing balance support: During functional activity;No upper extremity supported Standing balance-Leahy Scale: Fair                               Pertinent Vitals/Pain Pain Assessment: Faces Faces Pain Scale: Hurts little more Pain Location: where chest tubes were inserted Pain Descriptors / Indicators: Sore Pain Intervention(s): Monitored during session;Repositioned    Home Living Family/patient expects to be discharged to:: Private residence Living Arrangements: Spouse/significant other Available Help at Discharge: Family Type of Home: House Home Access: Stairs to enter Entrance Stairs-Rails: Doctor, general practice of Steps: 3 Home Layout: One level Home Equipment: Environmental consultant - 2 wheels;Crutches      Prior Function Level of Independence: Independent               Hand Dominance        Extremity/Trunk Assessment   Upper Extremity Assessment Upper Extremity Assessment: Overall WFL for tasks assessed    Lower Extremity Assessment Lower Extremity Assessment: Overall WFL for tasks assessed       Communication   Communication: No difficulties  Cognition Arousal/Alertness: Awake/alert Behavior During Therapy: WFL for tasks assessed/performed  Overall Cognitive Status: Within Functional Limits for tasks assessed                                        General Comments      Exercises     Assessment/Plan    PT Assessment Patient needs continued PT services  PT Problem List Decreased  strength;Decreased activity tolerance;Decreased balance;Decreased mobility;Decreased coordination       PT Treatment Interventions DME instruction;Gait training;Stair training;Functional mobility training;Therapeutic activities;Therapeutic exercise;Balance training;Neuromuscular re-education;Patient/family education    PT Goals (Current goals can be found in the Care Plan section)  Acute Rehab PT Goals Patient Stated Goal: return home PT Goal Formulation: With patient Time For Goal Achievement: 09/02/17 Potential to Achieve Goals: Good    Frequency Min 3X/week   Barriers to discharge        Co-evaluation               AM-PAC PT "6 Clicks" Daily Activity  Outcome Measure Difficulty turning over in bed (including adjusting bedclothes, sheets and blankets)?: A Little Difficulty moving from lying on back to sitting on the side of the bed? : Unable Difficulty sitting down on and standing up from a chair with arms (e.g., wheelchair, bedside commode, etc,.)?: A Little Help needed moving to and from a bed to chair (including a wheelchair)?: A Little Help needed walking in hospital room?: A Little Help needed climbing 3-5 steps with a railing? : A Lot 6 Click Score: 15    End of Session Equipment Utilized During Treatment: Gait belt Activity Tolerance: Patient tolerated treatment well Patient left: in chair;with call bell/phone within reach;with chair alarm set Nurse Communication: Mobility status PT Visit Diagnosis: Other abnormalities of gait and mobility (R26.89)    Time: 7782-4235 PT Time Calculation (min) (ACUTE ONLY): 25 min   Charges:   PT Evaluation $PT Eval Moderate Complexity: 1 Mod PT Treatments $Gait Training: 8-22 mins   PT G Codes:        [place], PT, DPT [phone]   Linda Berg 08/19/2017, 3:55 PM

## 2017-08-19 NOTE — Progress Notes (Signed)
   Patient Name: MAGGY WYBLE Date of Encounter: 08/19/2017, 4:31 PM    Subjective  Chest tubes discontinued today. Feeling better. Tolerating liquids and is passing flatus.   Objective  BP 113/63   Pulse 85   Temp 99.5 F (37.5 C)   Resp (!) 28   Ht 5' 1.5" (1.562 m)   Wt 149 lb 0.5 oz (67.6 kg)   SpO2 92%   BMI 27.70 kg/m  AOX3 Crepitus + neck and anterior chest Abdomen is soft non tender and no distension    Assessment and Plan   S/p EGD and colonoscopy with subcutaneous emphysema, pneumothorax and pneumoperitoneum Pneumothorax has improved and chest tubes were discontinued today after no air leak Continues to do well after extubation Abdomen remain soft with no peritoneal signs Advance diet as tolerated, full liquids tomorrow followed by soft diet for 1 week Continue antibiotics for 1 week Will arrange for outpatient follow up in 4-6 weeks with my office   K. Scherry Ran , MD [phone] Mon-Fri 8a-5p [phone] after 5p, weekends, holidays

## 2017-08-19 NOTE — Progress Notes (Signed)
PULMONARY / CRITICAL CARE MEDICINE   Name: Linda Berg MRN: [id number] DOB: 09-14-1945    ADMISSION DATE:  08/17/2017 CONSULTATION DATE:  08/17/2017   REFERRING MD:  ED Provider  CHIEF COMPLAINT:  anaphylaxis   SUBJECTIVE:  Off ventilator, comfortable, no abdominal pain, passing flatus, no trouble swallowing.  VITAL SIGNS: BP 114/61   Pulse 90   Temp 99 F (37.2 C)   Resp (!) 32   Ht 5' 1.5" (1.562 m)   Wt 149 lb 0.5 oz (67.6 kg)   SpO2 93%   BMI 27.70 kg/m   HEMODYNAMICS:    VENTILATOR SETTINGS: Vent Mode: PSV;CPAP FiO2 (%):  [32 %-40 %] 32 % PEEP:  [5 cmH20] 5 cmH20 Pressure Support:  [5 cmH20] 5 cmH20  INTAKE / OUTPUT: I/O last 3 completed shifts: In: 2793.4 [I.V.:2484.4; IV Piggyback:309] Out: 3215 [Urine:3175; Chest Tube:40]  PHYSICAL EXAMINATION: General:  Pleasant, Caucasian woman, NAD Neuro:  Following commands HEENT:  NCAT, mild neck swelling Cardiovascular:  RRR, subQ air noted bilaterally Lungs:  B/L chest tubes, normal effort, CTAB, symmetric rise and fall Abdomen:  Soft, no distention, no tenderness Musculoskeletal:  No deformities Skin:  Warm, dry, intact  LABS:  BMET Recent Labs  Lab 08/17/17 1815 08/18/17 0325 08/19/17 0510  NA 141 138 138  K 3.4* 4.0 3.6  CL 112* 108 108  CO2 22 19* 22  BUN 9 8 14   CREATININE 0.71 0.64 0.78  GLUCOSE 127* 126* 86    Electrolytes Recent Labs  Lab 08/17/17 1815 08/18/17 0325 08/19/17 0510  CALCIUM 7.7* 8.2* 8.4*  MG  --  1.8  --   PHOS  --  4.1  --     CBC Recent Labs  Lab 08/17/17 1815 08/18/17 0325 08/19/17 0510  WBC 8.6 8.0  7.9 5.9  HGB 11.5* 12.3  12.4 10.7*  HCT 36.4 38.2  38.6 32.9*  PLT 203 211  220 167    Coag's No results for input(s): APTT, INR in the last 168 hours.  Sepsis Markers Recent Labs  Lab 08/18/17 0325 08/19/17 0510  LATICACIDVEN 2.0*  --   PROCALCITON 0.27 0.30    ABG Recent Labs  Lab 08/17/17 1740 08/17/17 2347 08/18/17 0345  PHART  7.362 7.282* 7.281*  PCO2ART 34.8 42.7 44.8  PO2ART 150.0* 94.0 93.0    Liver Enzymes Recent Labs  Lab 08/17/17 1815 08/18/17 0325  AST 27 37  ALT 25 30  ALKPHOS 75 77  BILITOT 0.5 0.6  ALBUMIN 3.5 3.6    Cardiac Enzymes Recent Labs  Lab 08/18/17 0325 08/18/17 1152 08/18/17 1839  TROPONINI 1.33* 1.23* 1.28*    Glucose Recent Labs  Lab 08/17/17 2349  GLUCAP 125*    Imaging Dg Chest Port 1 View  Result Date: 08/19/2017 CLINICAL DATA:  Endotracheal tube EXAM: PORTABLE CHEST 1 VIEW COMPARISON:  08/18/2017 FINDINGS: Endotracheal tube no longer visualized.  NG tube removed. Bilateral pigtail chest tubes are present unchanged in position. Negative for pneumothorax. Moderate subcutaneous emphysema bilaterally has improved. Mild bibasilar atelectasis left greater than right is unchanged. Small left effusion unchanged. Cardiac and mediastinal contours normal IMPRESSION: Endotracheal tube removed. Bilateral chest tubes in place without pneumothorax. Improvement in subcutaneous emphysema. Electronically Signed   By: 10/16/2017 M.D.   On: 08/19/2017 06:34     STUDIES:  n/a  CULTURES: n/a  ANTIBIOTICS: Zosyn 1/8 >> 1/9 >> 1/10  SIGNIFICANT EVENTS: 1/8 intubated s/p EGD and colonoscopy due to concern for anaphylaxis  LINES/TUBES: 1/8  ETT >> 1/9  DISCUSSION: This is a 72 year old female, who is suspected to have experienced propofol-induced anaphylaxis/angioedema. This appears to have promptly improved with treatment (Benadryl, Decadron, epinephrine). However, her clinical course has been complicated by development of subcutaneous emphysema and pneumomediastinum and pneumoperitoneum. The patient's clinical status will need to be followed closely, as it is unclear how her free air was introduced.   ASSESSMENT / PLAN:  PULMONARY A: Pneumomediastinum Subcutaneous Emphysema Respiratory Failure requiring intubation Bilateral pneumothorax s/p chest tube placement  without air leak P:   She was successfully extubated yesterday afternoon and has done well.  Repeat CXR this AM without PTX.  Breathing comfortably, tolerating secretions.  There was no evidence of anaphylaxis or vocal cord edema noted at intubation making allergic reaction unlikely DC Chest tubes.  Check CXR after removal DC steroids, H1 and H2 blocker  CARDIOVASCULAR A:  Elevated troponin HLD P:  EKG non-ischemic Troponin peak at 1.3.  Without anginal symptoms.  Given age and co-morbidities, would likely benefit from cardiology evaluation prior to DC or as an outpatient ASA 81mg  daily NS at 75/hr, likely can DC once tolerating more PO intake  RENAL A:   NAGMA - resolved P:   Follow BMET  GASTROINTESTINAL A:   GI PPx GERD P:   Clear liquid diet, advance as tolerated  HEMATOLOGIC A:   No acute issues P:    INFECTIOUS A:   CBC normal, afebrile, but given air and potential of air originating form colon will continue Zosyn x 7 days P:   Zosyn, likely transition to PO meds prior to DC  ENDOCRINE A:   Hypothyroidism RA    P:   Resume home meds tomorrow  NEUROLOGIC A:   No acute issues P:   RASS goal: 0  Transfer to Tele, [facility] to take over 1/11   FAMILY  - Updates: updated today at the bedside  - Inter-disciplinary family meet or Palliative Care meeting due by:  day 7    Pulmonary and Critical Care Medicine [facility] Pager: [phone]  08/19/2017, 11:29 AM

## 2017-08-19 NOTE — Progress Notes (Signed)
Subjective/Chief Complaint: Extubated C/o chest pain/ neck pain  No abdominal pain Passing flatus   Objective: Vital signs in last 24 hours: Temp:  [98.6 F (37 C)-100.2 F (37.9 C)] 98.6 F (37 C) (01/10 0420) Pulse Rate:  [65-103] 102 (01/10 0420) Resp:  [11-40] 21 (01/10 0420) BP: (86-114)/(49-76) 110/73 (01/10 0420) SpO2:  [93 %-99 %] 93 % (01/10 0420) FiO2 (%):  [32 %-40 %] 32 % (01/09 1418) Weight:  [67.6 kg (149 lb 0.5 oz)] 67.6 kg (149 lb 0.5 oz) (01/10 0500) Last BM Date: 08/17/17  Intake/Output from previous day: 01/09 0701 - 01/10 0700 In: 1907.8 [I.V.:1650.8; IV Piggyback:257] Out: 1865 [Urine:1825; Chest Tube:40] Intake/Output this shift: No intake/output data recorded.  General appearance: alert, cooperative and no distress GI: soft, non-tender; bowel sounds normal; no masses,  no organomegaly  Lab Results:  Recent Labs    08/18/17 0325 08/19/17 0510  WBC 8.0  7.9 5.9  HGB 12.3  12.4 10.7*  HCT 38.2  38.6 32.9*  PLT 211  220 167   BMET Recent Labs    08/18/17 0325 08/19/17 0510  NA 138 138  K 4.0 3.6  CL 108 108  CO2 19* 22  GLUCOSE 126* 86  BUN 8 14  CREATININE 0.64 0.78  CALCIUM 8.2* 8.4*   PT/INR No results for input(s): LABPROT, INR in the last 72 hours. ABG Recent Labs    08/17/17 2347 08/18/17 0345  PHART 7.282* 7.281*  HCO3 20.2 21.2    Studies/Results: Dg Abdomen 1 View  Result Date: 08/17/2017 CLINICAL DATA:  Assessed gastric tube position. Recent history of colonoscopy necessitating intubation. EXAM: ABDOMEN - 1 VIEW COMPARISON:  None. FINDINGS: The tip and side port of a gastric tube are seen in the left upper quadrant of the abdomen in the expected location of the stomach. Extensive soft tissue emphysema along the included lower thorax and right hemiabdomen is identified with tracking of pneumomediastinum into the upper abdomen outlining the upper pole of the kidney. Additionally there is pneumoperitoneum with  visualization of both sides of bowel (Rigler sign)and outlining portions of the liver. Scoliotic curvature of the lumbar spine convex to the left is noted. Metallic clips project over the mid pelvis. IMPRESSION: 1. Satisfactory gastric tube position with tip and side-port in the expected location of the stomach just beneath the left hemidiaphragm. 2. There is free intraperitoneal air outlining bowel loops as well as dome of the liver. Tracking of pneumomediastinum is seen extending into the upper abdomen outlining portions of the right kidney as well. Overlying soft tissue emphysema along the right hemiabdomen and included lower thorax. These results were called by telephone at the time of interpretation on 08/17/2017 at 6:40 pm to Dr. Pricilla Loveless , who verbally acknowledged these results. Electronically Signed   By: Tollie Eth M.D.   On: 08/17/2017 18:40   Ct Chest W Contrast  Result Date: 08/17/2017 CLINICAL DATA:  Pneumomediastinum. Post EGD and colonoscopy today with possible Largen reaction. EXAM: CT CHEST, ABDOMEN, AND PELVIS WITH CONTRAST TECHNIQUE: Multidetector CT imaging of the chest, abdomen and pelvis was performed following the standard protocol during bolus administration of intravenous contrast. CONTRAST:  ISOVUE-300 IOPAMIDOL (ISOVUE-300) INJECTION 61% COMPARISON:  Chest radiographs earlier this day. Abdominal CT 03/07/2015 FINDINGS: CT CHEST FINDINGS Cardiovascular: Thoracic aorta is normal in caliber. No central pulmonary embolus. Heart is normal in size. No pericardial effusion. Mediastinum/Nodes: Extensive pneumomediastinum throughout the chest. Endotracheal and enteric tubes in place. No obvious esophageal wall  thickening. No enlarged mediastinal or hilar lymph nodes. Lungs/Pleura: Moderate to large left and small to moderate right pneumothorax. Consolidations dependently within the left greater than right lung and dependent right upper lobe may be aspiration or atelectasis.  Musculoskeletal: Extensive subcutaneous emphysema about the chest wall extending into the neck, right breast, and both upper extremities. Scoliosis and degenerative change in the spine. CT ABDOMEN PELVIS FINDINGS Hepatobiliary: No focal hepatic lesion. Gallbladder physiologically distended, no calcified stone. No biliary dilatation. Pancreas: No ductal dilatation or inflammation. Spleen: Normal in size without focal abnormality. Adrenals/Urinary Tract: Normal adrenal glands. Extraperitoneal air tracks in the retroperitoneum about the right kidney. No hydronephrosis or perinephric edema. Homogeneous renal enhancement with symmetric excretion on delayed phase imaging. Simple cyst in the posterior left kidney. Urinary bladder is distended without wall thickening. Stomach/Bowel: Free intraperitoneal and extraperitoneal air in the abdomen and pelvis. In stomach is decompressed with enteric tube in place. No obvious small bowel inflammation. Gaseous distention of the proximal colon from colonoscopy earlier this day, with relatively decompressed descending and sigmoid colon. There are two linear metallic densities within the distal sigmoid colon. Extraperitoneal air adjacent to the rectum and in the perirectal perineal subcutaneous tissues. An obvious bowel defect as source of air is not confidently visualized. Vascular/Lymphatic: Mild aortic atherosclerosis without aneurysm. No obvious gas in the mesenteric vasculature. Calcified mesenteric nodes are unchanged from prior exam. Limited assessment for adenopathy given degree of extraperitoneal air. Reproductive: Uterus and bilateral adnexa are unremarkable. Other: Extensive subcutaneous emphysema about the abdominal wall, greater on the right. Extraperitoneal air visualized in the pelvis, and throughout the retroperitoneum (right greater than left). There is a small-moderate amount of intraperitoneal air anteriorly. Scattered air throughout the lower mesentery. No definite  ascites. Musculoskeletal: Scoliosis and degenerative change in the spine. IMPRESSION: 1. Extensive pneumomediastinum. Moderate to large left and small to moderate right pneumothorax. Extensive chest wall subcutaneous emphysema. 2. Extensive extraperitoneal air in the abdomen and pelvis, greater on the right. Free intraperitoneal air. Extensive subcutaneous emphysema about the abdomen and pelvis. Subcutaneous emphysema in the perineum and perirectal soft tissues. 3. No obvious bowel perforation as cause of abnormal air. Colonic distention likely related to recent colonoscopy. Metallic densities in the distal sigmoid colon are presumed clips from colonoscopy. 4. Unchanged calcified lymph nodes in the small bowel mesentery. Critical Value/emergent results were called by telephone at the time of interpretation on 08/17/2017 at approximately 9:35 pm to Dr. Pricilla Loveless , who verbally acknowledged these results. Electronically Signed   By: Rubye Oaks M.D.   On: 08/17/2017 21:58   Ct Abdomen Pelvis W Contrast  Result Date: 08/17/2017 CLINICAL DATA:  Pneumomediastinum. Post EGD and colonoscopy today with possible Largen reaction. EXAM: CT CHEST, ABDOMEN, AND PELVIS WITH CONTRAST TECHNIQUE: Multidetector CT imaging of the chest, abdomen and pelvis was performed following the standard protocol during bolus administration of intravenous contrast. CONTRAST:  ISOVUE-300 IOPAMIDOL (ISOVUE-300) INJECTION 61% COMPARISON:  Chest radiographs earlier this day. Abdominal CT 03/07/2015 FINDINGS: CT CHEST FINDINGS Cardiovascular: Thoracic aorta is normal in caliber. No central pulmonary embolus. Heart is normal in size. No pericardial effusion. Mediastinum/Nodes: Extensive pneumomediastinum throughout the chest. Endotracheal and enteric tubes in place. No obvious esophageal wall thickening. No enlarged mediastinal or hilar lymph nodes. Lungs/Pleura: Moderate to large left and small to moderate right pneumothorax.  Consolidations dependently within the left greater than right lung and dependent right upper lobe may be aspiration or atelectasis. Musculoskeletal: Extensive subcutaneous emphysema about the chest wall extending  into the neck, right breast, and both upper extremities. Scoliosis and degenerative change in the spine. CT ABDOMEN PELVIS FINDINGS Hepatobiliary: No focal hepatic lesion. Gallbladder physiologically distended, no calcified stone. No biliary dilatation. Pancreas: No ductal dilatation or inflammation. Spleen: Normal in size without focal abnormality. Adrenals/Urinary Tract: Normal adrenal glands. Extraperitoneal air tracks in the retroperitoneum about the right kidney. No hydronephrosis or perinephric edema. Homogeneous renal enhancement with symmetric excretion on delayed phase imaging. Simple cyst in the posterior left kidney. Urinary bladder is distended without wall thickening. Stomach/Bowel: Free intraperitoneal and extraperitoneal air in the abdomen and pelvis. In stomach is decompressed with enteric tube in place. No obvious small bowel inflammation. Gaseous distention of the proximal colon from colonoscopy earlier this day, with relatively decompressed descending and sigmoid colon. There are two linear metallic densities within the distal sigmoid colon. Extraperitoneal air adjacent to the rectum and in the perirectal perineal subcutaneous tissues. An obvious bowel defect as source of air is not confidently visualized. Vascular/Lymphatic: Mild aortic atherosclerosis without aneurysm. No obvious gas in the mesenteric vasculature. Calcified mesenteric nodes are unchanged from prior exam. Limited assessment for adenopathy given degree of extraperitoneal air. Reproductive: Uterus and bilateral adnexa are unremarkable. Other: Extensive subcutaneous emphysema about the abdominal wall, greater on the right. Extraperitoneal air visualized in the pelvis, and throughout the retroperitoneum (right greater than  left). There is a small-moderate amount of intraperitoneal air anteriorly. Scattered air throughout the lower mesentery. No definite ascites. Musculoskeletal: Scoliosis and degenerative change in the spine. IMPRESSION: 1. Extensive pneumomediastinum. Moderate to large left and small to moderate right pneumothorax. Extensive chest wall subcutaneous emphysema. 2. Extensive extraperitoneal air in the abdomen and pelvis, greater on the right. Free intraperitoneal air. Extensive subcutaneous emphysema about the abdomen and pelvis. Subcutaneous emphysema in the perineum and perirectal soft tissues. 3. No obvious bowel perforation as cause of abnormal air. Colonic distention likely related to recent colonoscopy. Metallic densities in the distal sigmoid colon are presumed clips from colonoscopy. 4. Unchanged calcified lymph nodes in the small bowel mesentery. Critical Value/emergent results were called by telephone at the time of interpretation on 08/17/2017 at approximately 9:35 pm to Dr. Pricilla Loveless , who verbally acknowledged these results. Electronically Signed   By: Rubye Oaks M.D.   On: 08/17/2017 21:58   Dg Chest Port 1 View  Result Date: 08/19/2017 CLINICAL DATA:  Endotracheal tube EXAM: PORTABLE CHEST 1 VIEW COMPARISON:  08/18/2017 FINDINGS: Endotracheal tube no longer visualized.  NG tube removed. Bilateral pigtail chest tubes are present unchanged in position. Negative for pneumothorax. Moderate subcutaneous emphysema bilaterally has improved. Mild bibasilar atelectasis left greater than right is unchanged. Small left effusion unchanged. Cardiac and mediastinal contours normal IMPRESSION: Endotracheal tube removed. Bilateral chest tubes in place without pneumothorax. Improvement in subcutaneous emphysema. Electronically Signed   By: Marlan Palau M.D.   On: 08/19/2017 06:34   Dg Chest Port 1 View  Result Date: 08/18/2017 CLINICAL DATA:  Hypoxia EXAM: PORTABLE CHEST 1 VIEW COMPARISON:  August 17, 2017 chest radiograph and CT chest and abdomen August 17, 2017 FINDINGS: Endotracheal tube tip is 1.9 cm above the carina. Nasogastric tube tip and side port are in the stomach. There are chest tubes bilaterally. No pneumothorax is appreciable by radiography on the left. There is a small pneumothorax on the right in the apical regions seen radiographically. Widespread pneumomediastinum as well as subcutaneous emphysema and pneumoperitoneum are visualized radiographically, better seen on recent CT. There is patchy consolidation in the left  lung base. No new airspace opacity evident. Heart size normal. Pulmonary vascularity normal. No evident adenopathy. IMPRESSION: Tube and catheter positions as described. Small pneumothorax seen on the right. No pneumothorax seen on the left. Persistent consolidation left lung base. Extensive pneumomediastinum and subcutaneous emphysema noted. Pneumoperitoneum is appreciable, better seen on recent CT. Stable cardiac silhouette. Electronically Signed   By: Bretta Bang III M.D.   On: 08/18/2017 07:16   Dg Chest Port 1 View  Result Date: 08/18/2017 CLINICAL DATA:  Post chest tube insertion. EXAM: PORTABLE CHEST 1 VIEW COMPARISON:  CT and radiographs earlier this day. FINDINGS: Bilateral pigtail catheters have been placed. Pneumothoraces on CT are not well visualized radiographically detail large amount of overlying subcutaneous emphysema. Endotracheal tube 2.1 cm from the carina.  Enteric tube in place. Extensive pneumomediastinum, subcutaneous emphysema, and free and retroperitoneal air in the upper abdomen, as seen on CT. IMPRESSION: 1. Placement of bilateral pigtail catheters. Bilateral pneumothoraces are not well evaluated radiographically (visualized only on CT). 2. Again seen extensive pneumomediastinum, retroperitoneal and free air in the upper abdomen. Electronically Signed   By: Rubye Oaks M.D.   On: 08/18/2017 00:11   Dg Chest Portable 1 View  Result Date:  08/17/2017 CLINICAL DATA:  Evaluate orogastric tube after repositioning. EXAM: PORTABLE CHEST 1 VIEW COMPARISON:  Immediate preceding chest radiograph performed at 1642 hours FINDINGS: Diffuse subcutaneous emphysema is noted with pneumomediastinum as before. Heart size remains stable. No mediastinal widening. There is aortic atherosclerosis. No conclusive evidence for pneumo thorax though difficult to identify due to the extensive overlying soft tissue emphysema. Low-lying endotracheal tube is again seen with tip at 1.6 cm above the carina. Gastric tube is seen extending below the left hemidiaphragm however the tip and side-port are still not visualized on repeat imaging. Curvilinear lucency medial to the dome of the right hemidiaphragm may either represent a small focus of atelectasis, continuation of the patient's known pneumomediastinum or potentially a small focus of pneumoperitoneum. Stable dextroconvex curvature of the dorsal spine at the thoracolumbar junction. IMPRESSION: 1. Low-lying endotracheal tube tip 1.6 cm above the carina. 2. Nonvisualization the tip and side port of an indwelling gastric tube. The tube does extend below the left hemidiaphragm into the abdomen. 3. Extensive subcutaneous emphysema as before with pneumomediastinum. Crescentic lucency adjacent to the right hemidiaphragm may represent continuation of the pneumomediastinum, linear atelectasis at the right lung base or possibly tiny pneumoperitoneum. Abdomen radiographs may help for better assessment of the gastric tube as well as for assessment of possible air beneath the diaphragm (ordered at time of dictation). Electronically Signed   By: Tollie Eth M.D.   On: 08/17/2017 18:32   Dg Chest Portable 1 View  Result Date: 08/17/2017 CLINICAL DATA:  Endotracheal and gastric tube placement EXAM: PORTABLE CHEST 1 VIEW COMPARISON:  04/23/2014 FINDINGS: Extensive subcutaneous emphysema is seen at the base of the neck and overlying the thorax  as well as upper abdomen. The tip of an endotracheal tube is 1.7 cm above the carina. A gastric tube extends into the expected location of the stomach however the tip and side port are excluded on the current exam. Heart is normal in size. There is aortic atherosclerosis. Pneumomediastinum outlining the aortic arch and left heart border as well as base of heart is seen. Crescentic lucencies are seen at the lung bases that may either be related to the pneumomediastinum although the possibility of small amount of free air is not excluded as well. Dextroconvex curvature is  stable at the thoracolumbar junction. IMPRESSION: 1. Low lying endotracheal tube approximately 1.7 cm above the carina. Pullback at least 2 cm suggested. 2. Gastric tube tip and side port are excluded on the current exam but extends below the left hemidiaphragm. 3. Diffuse extensive subcutaneous emphysema with pneumomediastinum. 4. There are small crescentic lucencies at the base of the lungs which in part likely represents continuation of the pneumomediastinum although the possibility of a tiny amount of free intraperitoneal air beneath the diaphragm is not excluded, especially on the right. Electronically Signed   By: Tollie Eth M.D.   On: 08/17/2017 17:49    Anti-infectives: Anti-infectives (From admission, onward)   Start     Dose/Rate Route Frequency Ordered Stop   08/18/17 0400  piperacillin-tazobactam (ZOSYN) IVPB 3.375 g  Status:  Discontinued     3.375 g 12.5 mL/hr over 240 Minutes Intravenous Every 8 hours 08/17/17 2347 08/18/17 1034   08/17/17 1845  piperacillin-tazobactam (ZOSYN) IVPB 3.375 g     3.375 g 100 mL/hr over 30 Minutes Intravenous  Once 08/17/17 1839 08/17/17 2152      Assessment/Plan: S/p EGD/ colonoscopy complicated by anaphylaxis, bilateral pneumothoraces, pneumoperitoneum, and extensive subcutaneous emphysema  No apparent signs of perforated viscus - no abdominal tenderness, normal WBC, hemodynamically  stable  Advance diet Will sign off for now.    LOS: 2 days    Wynona Luna 08/19/2017

## 2017-08-20 ENCOUNTER — Encounter (HOSPITAL_COMMUNITY): Payer: Self-pay

## 2017-08-20 LAB — BASIC METABOLIC PANEL
Anion gap: 10 (ref 5–15)
BUN: 8 mg/dL (ref 6–20)
CO2: 27 mmol/L (ref 22–32)
CREATININE: 0.76 mg/dL (ref 0.44–1.00)
Calcium: 8.3 mg/dL — ABNORMAL LOW (ref 8.9–10.3)
Chloride: 99 mmol/L — ABNORMAL LOW (ref 101–111)
Glucose, Bld: 115 mg/dL — ABNORMAL HIGH (ref 65–99)
POTASSIUM: 2.8 mmol/L — AB (ref 3.5–5.1)
SODIUM: 136 mmol/L (ref 135–145)

## 2017-08-20 LAB — CBC
HCT: 32.9 % — ABNORMAL LOW (ref 36.0–46.0)
Hemoglobin: 10.8 g/dL — ABNORMAL LOW (ref 12.0–15.0)
MCH: 30.3 pg (ref 26.0–34.0)
MCHC: 32.8 g/dL (ref 30.0–36.0)
MCV: 92.2 fL (ref 78.0–100.0)
PLATELETS: 191 10*3/uL (ref 150–400)
RBC: 3.57 MIL/uL — AB (ref 3.87–5.11)
RDW: 13.4 % (ref 11.5–15.5)
WBC: 6.5 10*3/uL (ref 4.0–10.5)

## 2017-08-20 MED ORDER — ONDANSETRON HCL 4 MG/2ML IJ SOLN
4.0000 mg | Freq: Four times a day (QID) | INTRAMUSCULAR | Status: DC | PRN
  Administered 2017-08-20: 4 mg via INTRAVENOUS
  Filled 2017-08-20 (×2): qty 2

## 2017-08-20 MED ORDER — POTASSIUM CHLORIDE CRYS ER 20 MEQ PO TBCR
40.0000 meq | EXTENDED_RELEASE_TABLET | ORAL | Status: AC
  Administered 2017-08-20 (×2): 40 meq via ORAL
  Filled 2017-08-20: qty 2

## 2017-08-20 MED ORDER — AMOXICILLIN-POT CLAVULANATE 875-125 MG PO TABS
1.0000 | ORAL_TABLET | Freq: Two times a day (BID) | ORAL | Status: DC
  Administered 2017-08-20 – 2017-08-21 (×3): 1 via ORAL
  Filled 2017-08-20 (×3): qty 1

## 2017-08-20 NOTE — Progress Notes (Signed)
PROGRESS NOTE    Linda Berg  SFK:[id number] DOB: March 01, 1946 DOA: 08/17/2017 PCP: Richmond Campbell., PA-C  Brief Narrative: 72 year old female, admitted with pneumoperitoneum, pneumomediastinum and subcutaneous emphysema following colonoscopy. She was admitted to the ICU, intubated, followed by general surgery and pulmonary,, clinically improved, extubated, also had a chest tube which was removed yesterday. And transferred from [facility] to [facility] toda 1/11  Assessment & Plan:      Acute respiratory failure (HCC)/pneumomediastinum, pneumothorax/pneumoperitoneum, subcutaneous emphysema -Following colonoscopy and EGD -Initially anaphylaxis to propofol was suspected, however she had no vocal cord edema on intubation or other signs of anaphylaxis -It is possible she had a microperforation, she had free intraperitoneal air on admission CT scan, which resulted in all of the above, no obvious perforated viscus noted -Regardless clinically improved rapidly,  -Status post extubation -Pneumothorax resolved following chest tube, last x-ray with still mild pneumomediastinum -Transferred from ICU team to [facility] service today -Tolerating clears will advance to soft diet -Change to Zosyn to Augmentin to complete 7 day course -Follow-up chest x-ray in one week -Ambulate, out of bed, home tomorrow stable  RA -continue plaquenil  DVT prophylaxis: Hep SQ Code Status: Full Code Family Communication:none at bedside Disposition Plan:He ise tomorrow if stable  Consultants:   PCCM  GI  CCS   Procedures:   Antimicrobials:    Subjective: -Feels better, only mild abdominal discomfort, tolerating liquids -Chest pain and discomfort has resolved following chest tube removal Objective: Vitals:   08/19/17 2041 08/19/17 2045 08/20/17 0438 08/20/17 0445  BP: 121/71 121/71 108/65   Pulse: 96 93 79   Resp:   18   Temp: 98.9 F (37.2 C) 98.9 F (37.2 C) 98.6 F (37 C)   TempSrc: Oral Oral Oral     SpO2: 94% 94% 91% 94%  Weight: 56.3 kg (124 lb 1.9 oz)   56.3 kg (124 lb 1.9 oz)  Height: 5' 1.5" (1.562 m)       Intake/Output Summary (Last 24 hours) at 08/20/2017 1221 Last data filed at 08/20/2017 0936 Gross per 24 hour  Intake 1035 ml  Output 3100 ml  Net -2065 ml   Filed Weights   08/19/17 0500 08/19/17 2041 08/20/17 0445  Weight: 67.6 kg (149 lb 0.5 oz) 56.3 kg (124 lb 1.9 oz) 56.3 kg (124 lb 1.9 oz)    Examination:  General exam: Appears calm and comfortable, no distress, AAOx3 Respiratory system: Diminished breath sounds at the bases, rest clear Cardiovascular system: S1 & S2 heard, RRR. No JVD, murmurs, rubs, gallops  Gastrointestinal system: Abdomen is nondistended, soft and nontender.Normal bowel sounds heard. Central nervous system: Alert and oriented. No focal neurological deficits. Extremities: Symmetric 5 x 5 power. Skin: No rashes, lesions or ulcers Psychiatry: Judgement and insight appear normal. Mood & affect appropriate.     Data Reviewed:   CBC: Recent Labs  Lab 08/17/17 1815 08/18/17 0325 08/19/17 0510 08/20/17 0243  WBC 8.6 8.0  7.9 5.9 6.5  NEUTROABS 7.1 7.1  --   --   HGB 11.5* 12.3  12.4 10.7* 10.8*  HCT 36.4 38.2  38.6 32.9* 32.9*  MCV 94.3 94.8  94.6 95.4 92.2  PLT 203 211  220 167 191   Basic Metabolic Panel: Recent Labs  Lab 08/17/17 1815 08/18/17 0325 08/19/17 0510 08/20/17 0243  NA 141 138 138 136  K 3.4* 4.0 3.6 2.8*  CL 112* 108 108 99*  CO2 22 19* 22 27  GLUCOSE 127* 126* 86 115*  BUN  9 8 14 8   CREATININE 0.71 0.64 0.78 0.76  CALCIUM 7.7* 8.2* 8.4* 8.3*  MG  --  1.8  --   --   PHOS  --  4.1  --   --    GFR: Estimated Creatinine Clearance: 49.9 mL/min (by C-G formula based on SCr of 0.76 mg/dL). Liver Function Tests: Recent Labs  Lab 08/17/17 1815 08/18/17 0325  AST 27 37  ALT 25 30  ALKPHOS 75 77  BILITOT 0.5 0.6  PROT 5.8* 6.4*  ALBUMIN 3.5 3.6   No results for input(s): LIPASE, AMYLASE in the last  168 hours. No results for input(s): AMMONIA in the last 168 hours. Coagulation Profile: No results for input(s): INR, PROTIME in the last 168 hours. Cardiac Enzymes: Recent Labs  Lab 08/18/17 0325 08/18/17 1152 08/18/17 1839  TROPONINI 1.33* 1.23* 1.28*   BNP (last 3 results) No results for input(s): PROBNP in the last 8760 hours. HbA1C: No results for input(s): HGBA1C in the last 72 hours. CBG: Recent Labs  Lab 08/17/17 2349  GLUCAP 125*   Lipid Profile: No results for input(s): CHOL, HDL, LDLCALC, TRIG, CHOLHDL, LDLDIRECT in the last 72 hours. Thyroid Function Tests: No results for input(s): TSH, T4TOTAL, FREET4, T3FREE, THYROIDAB in the last 72 hours. Anemia Panel: No results for input(s): VITAMINB12, FOLATE, FERRITIN, TIBC, IRON, RETICCTPCT in the last 72 hours. Urine analysis: No results found for: COLORURINE, APPEARANCEUR, LABSPEC, PHURINE, GLUCOSEU, HGBUR, BILIRUBINUR, KETONESUR, PROTEINUR, UROBILINOGEN, NITRITE, LEUKOCYTESUR Sepsis Labs: @LABRCNTIP (procalcitonin:4,lacticidven:4)  ) Recent Results (from the past 240 hour(s))  MRSA PCR Screening     Status: None   Collection Time: 08/18/17  4:36 AM  Result Value Ref Range Status   MRSA by PCR NEGATIVE NEGATIVE Final    Comment:        The GeneXpert MRSA Assay (FDA approved for NASAL specimens only), is one component of a comprehensive MRSA colonization surveillance program. It is not intended to diagnose MRSA infection nor to guide or monitor treatment for MRSA infections.          Radiology Studies: Dg Chest 1 View  Result Date: 08/19/2017 CLINICAL DATA:  The patient has undergone removal of bilateral chest tubes. EXAM: CHEST 1 VIEW COMPARISON:  Portable chest x-ray of today's date at 3:50 a.m. FINDINGS: The small caliber chest tubes have been removed. There is no pneumothorax. There is a small amount of pneumomediastinum on the right. This is stable. There is considerable subcutaneous emphysema  bilaterally. The heart and pulmonary vascularity are normal. There is no pleural effusion. IMPRESSION: No pneumothorax following chest tube removal. Persistent mild pneumomediastinum on the right. Electronically Signed   By: David  [place] M.D.   On: 08/19/2017 13:17   Dg Chest Port 1 View  Result Date: 08/19/2017 CLINICAL DATA:  Endotracheal tube EXAM: PORTABLE CHEST 1 VIEW COMPARISON:  08/18/2017 FINDINGS: Endotracheal tube no longer visualized.  NG tube removed. Bilateral pigtail chest tubes are present unchanged in position. Negative for pneumothorax. Moderate subcutaneous emphysema bilaterally has improved. Mild bibasilar atelectasis left greater than right is unchanged. Small left effusion unchanged. Cardiac and mediastinal contours normal IMPRESSION: Endotracheal tube removed. Bilateral chest tubes in place without pneumothorax. Improvement in subcutaneous emphysema. Electronically Signed   By: Marlan Palau M.D.   On: 08/19/2017 06:34        Scheduled Meds: . amoxicillin-clavulanate  1 tablet Oral Q12H  . aspirin EC  81 mg Oral Daily  . heparin  5,000 Units Subcutaneous Q8H  . hydroxychloroquine  200 mg Oral Daily  . mouth rinse  15 mL Mouth Rinse BID  . pantoprazole  20 mg Oral Daily  . pravastatin  20 mg Oral q1800  . thyroid  60 mg Oral QAC breakfast   Continuous Infusions:   LOS: 3 days    Time spent: , including reviewing chart, labs, radiological data    Zannie Cove, MD [facility] Page via [email or web address], password [email or web address] After 7PM please contact night-coverage  08/20/2017, 12:21 PM

## 2017-08-21 LAB — BASIC METABOLIC PANEL
Anion gap: 6 (ref 5–15)
BUN: 12 mg/dL (ref 6–20)
CALCIUM: 8.7 mg/dL — AB (ref 8.9–10.3)
CO2: 26 mmol/L (ref 22–32)
CREATININE: 0.74 mg/dL (ref 0.44–1.00)
Chloride: 106 mmol/L (ref 101–111)
GFR calc non Af Amer: >60 mL/min (ref >60)
Glucose, Bld: 105 mg/dL — ABNORMAL HIGH (ref 65–99)
Potassium: 4.1 mmol/L (ref 3.5–5.1)
SODIUM: 138 mmol/L (ref 135–145)

## 2017-08-21 LAB — CBC
HCT: 35.3 % — ABNORMAL LOW (ref 36.0–46.0)
Hemoglobin: 11.8 g/dL — ABNORMAL LOW (ref 12.0–15.0)
MCH: 31.3 pg (ref 26.0–34.0)
MCHC: 33.4 g/dL (ref 30.0–36.0)
MCV: 93.6 fL (ref 78.0–100.0)
Platelets: 185 10*3/uL (ref 150–400)
RBC: 3.77 MIL/uL — AB (ref 3.87–5.11)
RDW: 13.5 % (ref 11.5–15.5)
WBC: 5.4 10*3/uL (ref 4.0–10.5)

## 2017-08-21 MED ORDER — AMOXICILLIN-POT CLAVULANATE 875-125 MG PO TABS: 1.0000 | ORAL_TABLET | Freq: Two times a day (BID) | ORAL | 0 refills | Status: DC

## 2017-08-21 NOTE — Care Management Note (Signed)
Case Management Note  Patient Details  Name: Linda Berg MRN: [id number] Date of Birth: [birth year]  Subjective/Objective:        Pt presented for anaphylaxis vs perforated colon after colonoscopy.  Pt independent from home.   Pt's symptoms have resolved.   Pt ambulating in room independently during assessment.        Action/Plan: Offered pt HH PT.  Pt declined saying she did not think it was necessary. No further CM needs at this time.   Expected Discharge Date:  08/21/17               Expected Discharge Plan:  Home/Self Care  In-House Referral:  NA  Discharge planning Services  CM Consult  Post Acute Care Choice:  NA Choice offered to:  Patient  DME Arranged:  N/A DME Agency:  NA  HH Arranged:  NA HH Agency:  NA  Status of Service:  Completed, signed off  If discussed at Long Length of Stay Meetings, dates discussed:    Additional Comments:  Verdene Lennert, RN 08/21/2017, 10:22 AM

## 2017-08-21 NOTE — Progress Notes (Signed)
Linda Berg to be D/C'd Home per MD order.  Discussed with the patient and all questions fully answered.  VSS, Skin clean, dry and intact without evidence of skin break down, no evidence of skin tears noted. IV catheter discontinued intact. Site without signs and symptoms of complications. Dressing and pressure applied.  An After Visit Summary was printed and given to the patient. Patient received prescription.  D/c education completed with patient/family including follow up instructions, medication list, d/c activities limitations if indicated, with other d/c instructions as indicated by MD - patient able to verbalize understanding, all questions fully answered.   Patient instructed to return to ED, call 911, or call MD for any changes in condition.   Patient escorted via WC, and D/C home via private auto.  Allergies as of 08/21/2017      Reactions   Ciprofloxacin Anaphylaxis   Facial numbness   Propofol Anaphylaxis   Terbinafine Rash   Prednisone Other (See Comments)   Stomach spasms      Medication List    STOP taking these medications   metoCLOPramide 10 MG tablet Commonly known as:  REGLAN     TAKE these medications   AMBULATORY NON FORMULARY MEDICATION ALLERGY SHOTS Once a week   amoxicillin-clavulanate 875-125 MG tablet Commonly known as:  AUGMENTIN Take 1 tablet by mouth every 12 (twelve) hours. For 6days   aspirin 81 MG tablet Take 81 mg by mouth 2 (two) times daily.   azelastine 0.1 % nasal spray Commonly known as:  ASTELIN Place 1 spray into the nose 2 (two) times daily. Use in each nostril as directed   Biotin 5000 MCG Tabs Take 1 tablet by mouth 1 day or 1 dose.   cetirizine 10 MG tablet Commonly known as:  ZYRTEC Take 10 mg by mouth at bedtime.   cholecalciferol 1000 units tablet Commonly known as:  VITAMIN D Take 1,000 Units by mouth daily.   Co Q 10 100 MG Caps Take 1 capsule by mouth 2 (two) times daily.   cycloSPORINE 0.05 % ophthalmic  emulsion Commonly known as:  RESTASIS Place 1 drop into both eyes 2 (two) times daily.   famotidine 20 MG tablet Commonly known as:  PEPCID Take 20 mg by mouth daily.   hydroxychloroquine 200 MG tablet Commonly known as:  PLAQUENIL TAKE 1 TABLET BY MOUTH  DAILY   Krill Oil 300 MG Caps Take 1 capsule by mouth daily.   lansoprazole 15 MG capsule Commonly known as:  PREVACID Take 15 mg by mouth daily at 12 noon.   lovastatin 20 MG tablet Commonly known as:  MEVACOR Take 20 mg by mouth daily.   Magnesium 250 MG Tabs Take 1 tablet by mouth 2 (two) times daily.   PROBIOTIC DAILY PO Take 1 tablet by mouth daily.   SUPER B COMPLEX PO Take 1 tablet by mouth daily.   thyroid 60 MG tablet Commonly known as:  ARMOUR Take 60 mg by mouth daily before breakfast.   vitamin C 500 MG tablet Commonly known as:  ASCORBIC ACID Take 1,000 mg by mouth 2 (two) times daily.   Zinc 50 MG Caps Take by mouth.      Linda Berg Linda Berg 08/21/2017 12:22 PM

## 2017-08-23 NOTE — Discharge Summary (Signed)
Physician Discharge Summary  Linda Berg ZRA:[id number] DOB: January 15, 1946 DOA: 08/17/2017  PCP: Richmond Campbell., PA-C  Admit date: 08/17/2017 Discharge date: 08/21/2017  Time spent: 35 minutes  Recommendations for Outpatient Follow-up:  1. PCP in 1 week   Discharge Diagnoses:  Active Problems:   Acute respiratory failure (HCC)   Pneumothorax, traumatic   Pneumomediastinum (HCC)   Pneumoperitoneum   subcutaneous emphysema   Rheumatoid arthritis  Discharge Condition: stable  Diet recommendation: soft diet  Filed Weights   08/19/17 2041 08/20/17 0445 08/21/17 0517  Weight: 56.3 kg (124 lb 1.9 oz) 56.3 kg (124 lb 1.9 oz) 56.3 kg (124 lb 1.9 oz)    History of present illness:  72 year old female, admitted with pneumoperitoneum, pneumomediastinum and subcutaneous emphysema following colonoscopy.  Hospital Course:   Acute respiratory failure with pneumomediastinum, pneumothorax, pneumoperitoneum and  subcutaneous emphysema -Developed following elective colonoscopy and EGD -Initially anaphylaxis to propofol was suspected, however she had no vocal cord edema on intubation or other signs of anaphylaxis - the other possibility is microperforation following colonoscopy with resultant complications, she had free intraperitoneal air on admission CT scan, which resulted in all of the above, no obvious perforated viscus noted  -Regardless clinically improved rapidly, was in ICU/intubated for first 48hours -Status post extubation -Pneumothorax resolved following chest tube, last x-ray with still mild pneumomediastinum -Transferred from ICU team to [facility] yesterday -diet gradually advanced to soft diet and tolerating this -Changed to Zosyn to Augmentin to complete 7 day course -Recommend Follow-up chest x-ray in one week -discharged home in stable condition, advised FU with PCP in 1 week  RA -continue  plaquenil     Consultations:  GI  CCS  PCCM/Pulm  Discharge Exam: Vitals:   08/20/17 2124 08/21/17 0517  BP: 99/60 110/65  Pulse: 96 81  Resp: 18 18  Temp: 98.2 F (36.8 C) 98.2 F (36.8 C)  SpO2: 96% 96%    General: AAOx3 Cardiovascular: S1S2/RRR Respiratory: CTAB  Discharge Instructions   Discharge Instructions    Diet - low sodium heart healthy   Complete by:  As directed    Increase activity slowly   Complete by:  As directed      Allergies as of 08/21/2017      Reactions   Ciprofloxacin Anaphylaxis   Facial numbness   Propofol Anaphylaxis   Terbinafine Rash   Prednisone Other (See Comments)   Stomach spasms      Medication List    STOP taking these medications   metoCLOPramide 10 MG tablet Commonly known as:  REGLAN     TAKE these medications   AMBULATORY NON FORMULARY MEDICATION ALLERGY SHOTS Once a week   amoxicillin-clavulanate 875-125 MG tablet Commonly known as:  AUGMENTIN Take 1 tablet by mouth every 12 (twelve) hours. For 6days   aspirin 81 MG tablet Take 81 mg by mouth 2 (two) times daily.   azelastine 0.1 % nasal spray Commonly known as:  ASTELIN Place 1 spray into the nose 2 (two) times daily. Use in each nostril as directed   Biotin 5000 MCG Tabs Take 1 tablet by mouth 1 day or 1 dose.   cetirizine 10 MG tablet Commonly known as:  ZYRTEC Take 10 mg by mouth at bedtime.   cholecalciferol 1000 units tablet Commonly known as:  VITAMIN D Take 1,000 Units by mouth daily.   Co Q 10 100 MG Caps Take 1 capsule by mouth 2 (two) times daily.   cycloSPORINE 0.05 % ophthalmic emulsion  Commonly known as:  RESTASIS Place 1 drop into both eyes 2 (two) times daily.   famotidine 20 MG tablet Commonly known as:  PEPCID Take 20 mg by mouth daily.   hydroxychloroquine 200 MG tablet Commonly known as:  PLAQUENIL TAKE 1 TABLET BY MOUTH  DAILY   Krill Oil 300 MG Caps Take 1 capsule by mouth daily.   lansoprazole 15 MG  capsule Commonly known as:  PREVACID Take 15 mg by mouth daily at 12 noon.   lovastatin 20 MG tablet Commonly known as:  MEVACOR Take 20 mg by mouth daily.   Magnesium 250 MG Tabs Take 1 tablet by mouth 2 (two) times daily.   PROBIOTIC DAILY PO Take 1 tablet by mouth daily.   SUPER B COMPLEX PO Take 1 tablet by mouth daily.   thyroid 60 MG tablet Commonly known as:  ARMOUR Take 60 mg by mouth daily before breakfast.   vitamin C 500 MG tablet Commonly known as:  ASCORBIC ACID Take 1,000 mg by mouth 2 (two) times daily.   Zinc 50 MG Caps Take by mouth.      Allergies  Allergen Reactions  . Ciprofloxacin Anaphylaxis    Facial numbness  . Propofol Anaphylaxis  . Terbinafine Rash  . Prednisone Other (See Comments)    Stomach spasms   Follow-up Information    Richmond Campbell., PA-C. Schedule an appointment as soon as possible for a visit in 1 week(s).   Specialty:  Family Medicine Contact information: [address] [place] [address] [phone]            The results of significant diagnostics from this hospitalization (including imaging, microbiology, ancillary and laboratory) are listed below for reference.    Significant Diagnostic Studies: Dg Chest 1 View  Result Date: 08/19/2017 CLINICAL DATA:  The patient has undergone removal of bilateral chest tubes. EXAM: CHEST 1 VIEW COMPARISON:  Portable chest x-ray of today's date at 3:50 a.m. FINDINGS: The small caliber chest tubes have been removed. There is no pneumothorax. There is a small amount of pneumomediastinum on the right. This is stable. There is considerable subcutaneous emphysema bilaterally. The heart and pulmonary vascularity are normal. There is no pleural effusion. IMPRESSION: No pneumothorax following chest tube removal. Persistent mild pneumomediastinum on the right. Electronically Signed   By: David  [place] M.D.   On: 08/19/2017 13:17   Dg Abdomen 1 View  Result Date:  08/17/2017 CLINICAL DATA:  Assessed gastric tube position. Recent history of colonoscopy necessitating intubation. EXAM: ABDOMEN - 1 VIEW COMPARISON:  None. FINDINGS: The tip and side port of a gastric tube are seen in the left upper quadrant of the abdomen in the expected location of the stomach. Extensive soft tissue emphysema along the included lower thorax and right hemiabdomen is identified with tracking of pneumomediastinum into the upper abdomen outlining the upper pole of the kidney. Additionally there is pneumoperitoneum with visualization of both sides of bowel (Rigler sign)and outlining portions of the liver. Scoliotic curvature of the lumbar spine convex to the left is noted. Metallic clips project over the mid pelvis. IMPRESSION: 1. Satisfactory gastric tube position with tip and side-port in the expected location of the stomach just beneath the left hemidiaphragm. 2. There is free intraperitoneal air outlining bowel loops as well as dome of the liver. Tracking of pneumomediastinum is seen extending into the upper abdomen outlining portions of the right kidney as well. Overlying soft tissue emphysema along the right hemiabdomen and included lower  thorax. These results were called by telephone at the time of interpretation on 08/17/2017 at 6:40 pm to Dr. Pricilla Loveless , who verbally acknowledged these results. Electronically Signed   By: Tollie Eth M.D.   On: 08/17/2017 18:40   Ct Chest W Contrast  Result Date: 08/17/2017 CLINICAL DATA:  Pneumomediastinum. Post EGD and colonoscopy today with possible Largen reaction. EXAM: CT CHEST, ABDOMEN, AND PELVIS WITH CONTRAST TECHNIQUE: Multidetector CT imaging of the chest, abdomen and pelvis was performed following the standard protocol during bolus administration of intravenous contrast. CONTRAST:  ISOVUE-300 IOPAMIDOL (ISOVUE-300) INJECTION 61% COMPARISON:  Chest radiographs earlier this day. Abdominal CT 03/07/2015 FINDINGS: CT CHEST FINDINGS  Cardiovascular: Thoracic aorta is normal in caliber. No central pulmonary embolus. Heart is normal in size. No pericardial effusion. Mediastinum/Nodes: Extensive pneumomediastinum throughout the chest. Endotracheal and enteric tubes in place. No obvious esophageal wall thickening. No enlarged mediastinal or hilar lymph nodes. Lungs/Pleura: Moderate to large left and small to moderate right pneumothorax. Consolidations dependently within the left greater than right lung and dependent right upper lobe may be aspiration or atelectasis. Musculoskeletal: Extensive subcutaneous emphysema about the chest wall extending into the neck, right breast, and both upper extremities. Scoliosis and degenerative change in the spine. CT ABDOMEN PELVIS FINDINGS Hepatobiliary: No focal hepatic lesion. Gallbladder physiologically distended, no calcified stone. No biliary dilatation. Pancreas: No ductal dilatation or inflammation. Spleen: Normal in size without focal abnormality. Adrenals/Urinary Tract: Normal adrenal glands. Extraperitoneal air tracks in the retroperitoneum about the right kidney. No hydronephrosis or perinephric edema. Homogeneous renal enhancement with symmetric excretion on delayed phase imaging. Simple cyst in the posterior left kidney. Urinary bladder is distended without wall thickening. Stomach/Bowel: Free intraperitoneal and extraperitoneal air in the abdomen and pelvis. In stomach is decompressed with enteric tube in place. No obvious small bowel inflammation. Gaseous distention of the proximal colon from colonoscopy earlier this day, with relatively decompressed descending and sigmoid colon. There are two linear metallic densities within the distal sigmoid colon. Extraperitoneal air adjacent to the rectum and in the perirectal perineal subcutaneous tissues. An obvious bowel defect as source of air is not confidently visualized. Vascular/Lymphatic: Mild aortic atherosclerosis without aneurysm. No obvious gas in  the mesenteric vasculature. Calcified mesenteric nodes are unchanged from prior exam. Limited assessment for adenopathy given degree of extraperitoneal air. Reproductive: Uterus and bilateral adnexa are unremarkable. Other: Extensive subcutaneous emphysema about the abdominal wall, greater on the right. Extraperitoneal air visualized in the pelvis, and throughout the retroperitoneum (right greater than left). There is a small-moderate amount of intraperitoneal air anteriorly. Scattered air throughout the lower mesentery. No definite ascites. Musculoskeletal: Scoliosis and degenerative change in the spine. IMPRESSION: 1. Extensive pneumomediastinum. Moderate to large left and small to moderate right pneumothorax. Extensive chest wall subcutaneous emphysema. 2. Extensive extraperitoneal air in the abdomen and pelvis, greater on the right. Free intraperitoneal air. Extensive subcutaneous emphysema about the abdomen and pelvis. Subcutaneous emphysema in the perineum and perirectal soft tissues. 3. No obvious bowel perforation as cause of abnormal air. Colonic distention likely related to recent colonoscopy. Metallic densities in the distal sigmoid colon are presumed clips from colonoscopy. 4. Unchanged calcified lymph nodes in the small bowel mesentery. Critical Value/emergent results were called by telephone at the time of interpretation on 08/17/2017 at approximately 9:35 pm to Dr. Pricilla Loveless , who verbally acknowledged these results. Electronically Signed   By: Rubye Oaks M.D.   On: 08/17/2017 21:58   Ct Abdomen Pelvis W Contrast  Result Date: 08/17/2017 CLINICAL DATA:  Pneumomediastinum. Post EGD and colonoscopy today with possible Largen reaction. EXAM: CT CHEST, ABDOMEN, AND PELVIS WITH CONTRAST TECHNIQUE: Multidetector CT imaging of the chest, abdomen and pelvis was performed following the standard protocol during bolus administration of intravenous contrast. CONTRAST:  ISOVUE-300 IOPAMIDOL  (ISOVUE-300) INJECTION 61% COMPARISON:  Chest radiographs earlier this day. Abdominal CT 03/07/2015 FINDINGS: CT CHEST FINDINGS Cardiovascular: Thoracic aorta is normal in caliber. No central pulmonary embolus. Heart is normal in size. No pericardial effusion. Mediastinum/Nodes: Extensive pneumomediastinum throughout the chest. Endotracheal and enteric tubes in place. No obvious esophageal wall thickening. No enlarged mediastinal or hilar lymph nodes. Lungs/Pleura: Moderate to large left and small to moderate right pneumothorax. Consolidations dependently within the left greater than right lung and dependent right upper lobe may be aspiration or atelectasis. Musculoskeletal: Extensive subcutaneous emphysema about the chest wall extending into the neck, right breast, and both upper extremities. Scoliosis and degenerative change in the spine. CT ABDOMEN PELVIS FINDINGS Hepatobiliary: No focal hepatic lesion. Gallbladder physiologically distended, no calcified stone. No biliary dilatation. Pancreas: No ductal dilatation or inflammation. Spleen: Normal in size without focal abnormality. Adrenals/Urinary Tract: Normal adrenal glands. Extraperitoneal air tracks in the retroperitoneum about the right kidney. No hydronephrosis or perinephric edema. Homogeneous renal enhancement with symmetric excretion on delayed phase imaging. Simple cyst in the posterior left kidney. Urinary bladder is distended without wall thickening. Stomach/Bowel: Free intraperitoneal and extraperitoneal air in the abdomen and pelvis. In stomach is decompressed with enteric tube in place. No obvious small bowel inflammation. Gaseous distention of the proximal colon from colonoscopy earlier this day, with relatively decompressed descending and sigmoid colon. There are two linear metallic densities within the distal sigmoid colon. Extraperitoneal air adjacent to the rectum and in the perirectal perineal subcutaneous tissues. An obvious bowel defect as  source of air is not confidently visualized. Vascular/Lymphatic: Mild aortic atherosclerosis without aneurysm. No obvious gas in the mesenteric vasculature. Calcified mesenteric nodes are unchanged from prior exam. Limited assessment for adenopathy given degree of extraperitoneal air. Reproductive: Uterus and bilateral adnexa are unremarkable. Other: Extensive subcutaneous emphysema about the abdominal wall, greater on the right. Extraperitoneal air visualized in the pelvis, and throughout the retroperitoneum (right greater than left). There is a small-moderate amount of intraperitoneal air anteriorly. Scattered air throughout the lower mesentery. No definite ascites. Musculoskeletal: Scoliosis and degenerative change in the spine. IMPRESSION: 1. Extensive pneumomediastinum. Moderate to large left and small to moderate right pneumothorax. Extensive chest wall subcutaneous emphysema. 2. Extensive extraperitoneal air in the abdomen and pelvis, greater on the right. Free intraperitoneal air. Extensive subcutaneous emphysema about the abdomen and pelvis. Subcutaneous emphysema in the perineum and perirectal soft tissues. 3. No obvious bowel perforation as cause of abnormal air. Colonic distention likely related to recent colonoscopy. Metallic densities in the distal sigmoid colon are presumed clips from colonoscopy. 4. Unchanged calcified lymph nodes in the small bowel mesentery. Critical Value/emergent results were called by telephone at the time of interpretation on 08/17/2017 at approximately 9:35 pm to Dr. Pricilla Loveless , who verbally acknowledged these results. Electronically Signed   By: Rubye Oaks M.D.   On: 08/17/2017 21:58   Dg Chest Port 1 View  Result Date: 08/19/2017 CLINICAL DATA:  Endotracheal tube EXAM: PORTABLE CHEST 1 VIEW COMPARISON:  08/18/2017 FINDINGS: Endotracheal tube no longer visualized.  NG tube removed. Bilateral pigtail chest tubes are present unchanged in position. Negative for  pneumothorax. Moderate subcutaneous emphysema bilaterally has improved. Mild bibasilar atelectasis  left greater than right is unchanged. Small left effusion unchanged. Cardiac and mediastinal contours normal IMPRESSION: Endotracheal tube removed. Bilateral chest tubes in place without pneumothorax. Improvement in subcutaneous emphysema. Electronically Signed   By: Marlan Palau M.D.   On: 08/19/2017 06:34   Dg Chest Port 1 View  Result Date: 08/18/2017 CLINICAL DATA:  Hypoxia EXAM: PORTABLE CHEST 1 VIEW COMPARISON:  August 17, 2017 chest radiograph and CT chest and abdomen August 17, 2017 FINDINGS: Endotracheal tube tip is 1.9 cm above the carina. Nasogastric tube tip and side port are in the stomach. There are chest tubes bilaterally. No pneumothorax is appreciable by radiography on the left. There is a small pneumothorax on the right in the apical regions seen radiographically. Widespread pneumomediastinum as well as subcutaneous emphysema and pneumoperitoneum are visualized radiographically, better seen on recent CT. There is patchy consolidation in the left lung base. No new airspace opacity evident. Heart size normal. Pulmonary vascularity normal. No evident adenopathy. IMPRESSION: Tube and catheter positions as described. Small pneumothorax seen on the right. No pneumothorax seen on the left. Persistent consolidation left lung base. Extensive pneumomediastinum and subcutaneous emphysema noted. Pneumoperitoneum is appreciable, better seen on recent CT. Stable cardiac silhouette. Electronically Signed   By: Bretta Bang III M.D.   On: 08/18/2017 07:16   Dg Chest Port 1 View  Result Date: 08/18/2017 CLINICAL DATA:  Post chest tube insertion. EXAM: PORTABLE CHEST 1 VIEW COMPARISON:  CT and radiographs earlier this day. FINDINGS: Bilateral pigtail catheters have been placed. Pneumothoraces on CT are not well visualized radiographically detail large amount of overlying subcutaneous emphysema.  Endotracheal tube 2.1 cm from the carina.  Enteric tube in place. Extensive pneumomediastinum, subcutaneous emphysema, and free and retroperitoneal air in the upper abdomen, as seen on CT. IMPRESSION: 1. Placement of bilateral pigtail catheters. Bilateral pneumothoraces are not well evaluated radiographically (visualized only on CT). 2. Again seen extensive pneumomediastinum, retroperitoneal and free air in the upper abdomen. Electronically Signed   By: Rubye Oaks M.D.   On: 08/18/2017 00:11   Dg Chest Portable 1 View  Result Date: 08/17/2017 CLINICAL DATA:  Evaluate orogastric tube after repositioning. EXAM: PORTABLE CHEST 1 VIEW COMPARISON:  Immediate preceding chest radiograph performed at 1642 hours FINDINGS: Diffuse subcutaneous emphysema is noted with pneumomediastinum as before. Heart size remains stable. No mediastinal widening. There is aortic atherosclerosis. No conclusive evidence for pneumo thorax though difficult to identify due to the extensive overlying soft tissue emphysema. Low-lying endotracheal tube is again seen with tip at 1.6 cm above the carina. Gastric tube is seen extending below the left hemidiaphragm however the tip and side-port are still not visualized on repeat imaging. Curvilinear lucency medial to the dome of the right hemidiaphragm may either represent a small focus of atelectasis, continuation of the patient's known pneumomediastinum or potentially a small focus of pneumoperitoneum. Stable dextroconvex curvature of the dorsal spine at the thoracolumbar junction. IMPRESSION: 1. Low-lying endotracheal tube tip 1.6 cm above the carina. 2. Nonvisualization the tip and side port of an indwelling gastric tube. The tube does extend below the left hemidiaphragm into the abdomen. 3. Extensive subcutaneous emphysema as before with pneumomediastinum. Crescentic lucency adjacent to the right hemidiaphragm may represent continuation of the pneumomediastinum, linear atelectasis at the  right lung base or possibly tiny pneumoperitoneum. Abdomen radiographs may help for better assessment of the gastric tube as well as for assessment of possible air beneath the diaphragm (ordered at time of dictation). Electronically Signed   By:  Tollie Eth M.D.   On: 08/17/2017 18:32   Dg Chest Portable 1 View  Result Date: 08/17/2017 CLINICAL DATA:  Endotracheal and gastric tube placement EXAM: PORTABLE CHEST 1 VIEW COMPARISON:  04/23/2014 FINDINGS: Extensive subcutaneous emphysema is seen at the base of the neck and overlying the thorax as well as upper abdomen. The tip of an endotracheal tube is 1.7 cm above the carina. A gastric tube extends into the expected location of the stomach however the tip and side port are excluded on the current exam. Heart is normal in size. There is aortic atherosclerosis. Pneumomediastinum outlining the aortic arch and left heart border as well as base of heart is seen. Crescentic lucencies are seen at the lung bases that may either be related to the pneumomediastinum although the possibility of small amount of free air is not excluded as well. Dextroconvex curvature is stable at the thoracolumbar junction. IMPRESSION: 1. Low lying endotracheal tube approximately 1.7 cm above the carina. Pullback at least 2 cm suggested. 2. Gastric tube tip and side port are excluded on the current exam but extends below the left hemidiaphragm. 3. Diffuse extensive subcutaneous emphysema with pneumomediastinum. 4. There are small crescentic lucencies at the base of the lungs which in part likely represents continuation of the pneumomediastinum although the possibility of a tiny amount of free intraperitoneal air beneath the diaphragm is not excluded, especially on the right. Electronically Signed   By: Tollie Eth M.D.   On: 08/17/2017 17:49    Microbiology: Recent Results (from the past 240 hour(s))  MRSA PCR Screening     Status: None   Collection Time: 08/18/17  4:36 AM  Result Value  Ref Range Status   MRSA by PCR NEGATIVE NEGATIVE Final    Comment:        The GeneXpert MRSA Assay (FDA approved for NASAL specimens only), is one component of a comprehensive MRSA colonization surveillance program. It is not intended to diagnose MRSA infection nor to guide or monitor treatment for MRSA infections.      Labs: Basic Metabolic Panel: Recent Labs  Lab 08/17/17 1815 08/18/17 0325 08/19/17 0510 08/20/17 0243 08/21/17 0550  NA 141 138 138 136 138  K 3.4* 4.0 3.6 2.8* 4.1  CL 112* 108 108 99* 106  CO2 22 19* 22 27 26   GLUCOSE 127* 126* 86 115* 105*  BUN 9 8 14 8 12   CREATININE 0.71 0.64 0.78 0.76 0.74  CALCIUM 7.7* 8.2* 8.4* 8.3* 8.7*  MG  --  1.8  --   --   --   PHOS  --  4.1  --   --   --    Liver Function Tests: Recent Labs  Lab 08/17/17 1815 08/18/17 0325  AST 27 37  ALT 25 30  ALKPHOS 75 77  BILITOT 0.5 0.6  PROT 5.8* 6.4*  ALBUMIN 3.5 3.6   No results for input(s): LIPASE, AMYLASE in the last 168 hours. No results for input(s): AMMONIA in the last 168 hours. CBC: Recent Labs  Lab 08/17/17 1815 08/18/17 0325 08/19/17 0510 08/20/17 0243 08/21/17 0550  WBC 8.6 8.0  7.9 5.9 6.5 5.4  NEUTROABS 7.1 7.1  --   --   --   HGB 11.5* 12.3  12.4 10.7* 10.8* 11.8*  HCT 36.4 38.2  38.6 32.9* 32.9* 35.3*  MCV 94.3 94.8  94.6 95.4 92.2 93.6  PLT 203 211  220 167 191 185   Cardiac Enzymes: Recent Labs  Lab 08/18/17 0325  08/18/17 1152 08/18/17 1839  TROPONINI 1.33* 1.23* 1.28*   BNP: BNP (last 3 results) No results for input(s): BNP in the last 8760 hours.  ProBNP (last 3 results) No results for input(s): PROBNP in the last 8760 hours.  CBG: Recent Labs  Lab 08/17/17 2349  GLUCAP 125*       Signed:  Zannie Cove MD.  [facility] 08/23/2017, 3:52 PM

## 2017-08-24 ENCOUNTER — Telehealth: Payer: Self-pay | Admitting: Gastroenterology

## 2017-08-25 ENCOUNTER — Ambulatory Visit: Payer: Medicare Other | Admitting: Rheumatology

## 2017-08-25 NOTE — Telephone Encounter (Signed)
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

## 2017-08-26 NOTE — Progress Notes (Signed)
Office Visit Note  Patient: Linda Berg             Date of Birth: [birth year]           MRN: [id number]             PCP: Richmond Campbell., PA-C Referring: Richmond Campbell., PA-C Visit Date: 09/09/2017 Occupation: @GUAROCC @    Subjective:  Hand pain and stiffness    History of Present Illness: Linda Berg is a 72 y.o. female with history of seropositive rheumatoid arthritis and sicca syndrome.   Patient reports that she continues to take PLQ 1 tablet daily.  She has pain in her hands occasionally and mild swelling in her bilateral hands.  She has significant joint stiffness.  She reports she had labs performed on Monday and her PLQ eye exam was performed yesterday.    Patient reports she was hospitalized at [facility] from January 8-12th following a routine colonoscopy. There was a complication during the procedure and she developed bilateral pneumothorax.  She was in the ICU for a couple days on a ventilator with bilateral chest tubes.  She has left sided rib pain and still has fatigue and weakness.  She has followed up with her PCP since discharge.    She continues to have chronic lower back pain.  She also has bilateral trochanteric bursitis.    Activities of Daily Living:  Patient reports morning stiffness for  all day.   Patient Reports nocturnal pain.  Difficulty dressing/grooming: Denies Difficulty climbing stairs: Denies Difficulty getting out of chair: Denies Difficulty using hands for taps, buttons, cutlery, and/or writing: Denies   Review of Systems  Constitutional: Positive for fatigue. Negative for weakness.  HENT: Positive for mouth dryness. Negative for mouth sores and nose dryness.   Eyes: Positive for dryness (On Restasis ). Negative for redness and visual disturbance.  Respiratory: Negative for cough, hemoptysis, shortness of breath and difficulty breathing.   Cardiovascular: Negative for chest pain, palpitations, hypertension, irregular heartbeat  and swelling in legs/feet.  Gastrointestinal: Negative for blood in stool, constipation and diarrhea.  Endocrine: Negative for increased urination.  Genitourinary: Negative for painful urination.  Musculoskeletal: Positive for arthralgias, joint pain, joint swelling and morning stiffness. Negative for myalgias, muscle weakness, muscle tenderness and myalgias.  Skin: Positive for color change. Negative for pallor, rash, hair loss, nodules/bumps, redness, skin tightness, ulcers and sensitivity to sunlight.  Allergic/Immunologic: Negative for susceptible to infections.  Neurological: Negative for dizziness, numbness and headaches.  Hematological: Negative for swollen glands.  Psychiatric/Behavioral: Negative for depressed mood and sleep disturbance. The patient is not nervous/anxious.     PMFS History:  Patient Active Problem List   Diagnosis Date Noted  . Pneumomediastinum (HCC)   . Pneumoperitoneum   . Acute respiratory failure (HCC)   . Pneumothorax, traumatic   . Anaphylaxis 08/17/2017  . Trochanteric bursitis of both hips 03/16/2017  . Rheumatoid arthritis involving multiple sites with positive rheumatoid factor (HCC) 10/09/2016  . Sicca syndrome, unspecified (HCC) 10/09/2016  . Osteopenia of multiple sites 10/09/2016  . History of scoliosis 10/09/2016  . History of gastroesophageal reflux (GERD) 10/09/2016  . History of hypothyroidism 10/09/2016  . History of hyperlipidemia 10/09/2016  . High risk medication use 10/09/2016  . Primary osteoarthritis of both hands 10/09/2016  . Paresthesia 10/11/2014  . Hyperlipidemia 10/11/2014  . Small vessel disease, 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/11/2014  . Tinnitus 09/17/2014  . Abdominal pain 09/05/2014  . Acute cystitis 09/05/2014  . Arthritis 09/05/2014  .  Cough 09/05/2014  . Esophagitis 09/05/2014  . Facial numbness 09/05/2014  . Difficulty hearing 09/05/2014  . H/O disease 09/05/2014  . HLD (hyperlipidemia) 09/05/2014  . Muscle ache  09/05/2014  . Symptoms involving urinary system 09/05/2014  . Awareness of heartbeats 09/05/2014  . Acne erythematosa 09/05/2014  . Absence of bladder continence 09/05/2014  . Urgency of micturation 09/05/2014  . Routine general medical examination at a health care facility 09/05/2014  . DYSPHAGIA 01/09/2009  . HYPOTHYROIDISM 11/04/2007  . DYSLIPIDEMIA 11/04/2007  . GERD 11/04/2007  . 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/04/2007  . DUODENITIS, WITHOUT HEMORRHAGE 11/04/2007  . HIATAL HERNIA 11/04/2007  . DIVERTICULOSIS, COLON 11/04/2007  . CONSTIPATION, CHRONIC 11/04/2007  . RECTAL BLEEDING 11/04/2007    Past Medical History:  Diagnosis Date  . Barrett esophagus   . Diverticulosis   . Dyslipidemia   . Facial numbness   . GERD (gastroesophageal reflux disease)   . Hearing loss   . Hiatal hernia   . Hypothyroidism   . Rheumatoid arteritis     Family History  Problem Relation Age of Onset  . Heart disease Mother   . Stroke Father   . Heart disease Maternal Aunt        aunt x 2  . Irritable bowel syndrome Son   . GER disease Son   . Colon cancer Neg Hx    Past Surgical History:  Procedure Laterality Date  . CHEST TUBE INSERTION  08/17/2017  . COLONOSCOPY  08/17/2017  . INTRAUTERINE DEVICE INSERTION    . IUD REMOVAL    . KNEE ARTHROSCOPY  12/25/11   left  . LEFT OOPHORECTOMY  03/13/98  . MANDIBLE SURGERY  9/93   pallete expansion  . TONSILLECTOMY    . TUBAL LIGATION     Social History   Social History Narrative   Live with husband at home.   Left-handed.   1 cup coffee/day     Objective: Vital Signs: BP 110/68 (BP Location: Left Arm, Patient Position: Sitting, Cuff Size: Normal)   Pulse 78   Resp 16   Ht 5' 1.5" (1.562 m)   Wt 144 lb 8 oz (65.5 kg)   BMI 26.86 kg/m    Physical Exam  Constitutional: She is oriented to person, place, and time. She appears well-developed and well-nourished.  HENT:  Head: Normocephalic and atraumatic.  Eyes: Conjunctivae and EOM are  normal.  Neck: Normal range of motion.  Cardiovascular: Normal rate, regular rhythm, normal heart sounds and intact distal pulses.  Pulmonary/Chest: Effort normal and breath sounds normal.  Abdominal: Soft. Bowel sounds are normal.  Lymphadenopathy:    She has no cervical adenopathy.  Neurological: She is alert and oriented to person, place, and time.  Skin: Skin is warm and dry. Capillary refill takes less than 2 seconds.  Psychiatric: She has a normal mood and affect. Her behavior is normal.  Nursing note and vitals reviewed.    Musculoskeletal Exam: C-spine, thoracic, and lumbar spine good ROM.  Shoulder joints, elbow joints, wrist joints, MCPs, PIPs, and DIPs good ROM with no synovitis.  PIP and DIP synovial thickening consistent with osteoarthritis.  She has subluxations of several DIP joints.  She has tenderness of several PIP joints.  Hip joints, knee joints, ankle joints, MTPs, PIPs, and DIPs good ROM with no synovitis.  She has bilateral trochanteric bursitis.  No midline spinal tenderness or SI joint tenderness.    CDAI Exam: CDAI Homunculus Exam:   Joint Counts:  CDAI Tender Joint count:  0 CDAI Swollen Joint count: 0  Global Assessments:  Patient Global Assessment: 5 Provider Global Assessment: 5  CDAI Calculated Score: 10    Investigation: No additional findings.PLQ eye exam: 09/09/17 CBC Latest Ref Rng & Units 08/21/2017 08/20/2017 08/19/2017  WBC 4.0 - 10.5 K/uL 5.4 6.5 5.9  Hemoglobin 12.0 - 15.0 g/dL 11.8(L) 10.8(L) 10.7(L)  Hematocrit 36.0 - 46.0 % 35.3(L) 32.9(L) 32.9(L)  Platelets 150 - 400 K/uL 185 191 167   CMP Latest Ref Rng & Units 08/21/2017 08/20/2017 08/19/2017  Glucose 65 - 99 mg/dL 952(W) 413(K) 86  BUN 6 - 20 mg/dL 12 8 14   Creatinine 0.44 - 1.00 mg/dL 4.40 1.02 7.25  Sodium 135 - 145 mmol/L 138 136 138  Potassium 3.5 - 5.1 mmol/L 4.1 2.8(L) 3.6  Chloride 101 - 111 mmol/L 106 99(L) 108  CO2 22 - 32 mmol/L 26 27 22   Calcium 8.9 - 10.3 mg/dL 3.6(U)  8.3(L) 8.4(L)  Total Protein 6.5 - 8.1 g/dL - - -  Total Bilirubin 0.3 - 1.2 mg/dL - - -  Alkaline Phos 38 - 126 U/L - - -  AST 15 - 41 U/L - - -  ALT 14 - 54 U/L - - -    Imaging: Dg Chest 1 View  Result Date: 08/19/2017 CLINICAL DATA:  The patient has undergone removal of bilateral chest tubes. EXAM: CHEST 1 VIEW COMPARISON:  Portable chest x-ray of today's date at 3:50 a.m. FINDINGS: The small caliber chest tubes have been removed. There is no pneumothorax. There is a small amount of pneumomediastinum on the right. This is stable. There is considerable subcutaneous emphysema bilaterally. The heart and pulmonary vascularity are normal. There is no pleural effusion. IMPRESSION: No pneumothorax following chest tube removal. Persistent mild pneumomediastinum on the right. Electronically Signed   By: David  [place] M.D.   On: 08/19/2017 13:17   Dg Chest 2 View  Result Date: 08/31/2017 CLINICAL DATA:  Recent bilateral pneumothoraces post colonoscopy. EXAM: CHEST  2 VIEW COMPARISON:  Portable chest x-ray of August 19, 2017 FINDINGS: The lungs are well-expanded. There is no pneumothorax or pneumomediastinum. There is stable biapical pleural thickening. The large amount of subcutaneous emphysema previously demonstrated has resolved. The heart and pulmonary vascularity are normal. The mediastinum is normal in width. There is mild multilevel degenerative disc disease of the thoracic spine. IMPRESSION: No residual pneumothorax or pneumomediastinum or subcutaneous emphysema. Stable biapical pleural thickening likely reflects previous granulomatous infection. No acute cardiopulmonary abnormality. Electronically Signed   By: David  [place] M.D.   On: 08/31/2017 16:42   Dg Abdomen 1 View  Result Date: 08/17/2017 CLINICAL DATA:  Assessed gastric tube position. Recent history of colonoscopy necessitating intubation. EXAM: ABDOMEN - 1 VIEW COMPARISON:  None. FINDINGS: The tip and side port of a gastric tube are  seen in the left upper quadrant of the abdomen in the expected location of the stomach. Extensive soft tissue emphysema along the included lower thorax and right hemiabdomen is identified with tracking of pneumomediastinum into the upper abdomen outlining the upper pole of the kidney. Additionally there is pneumoperitoneum with visualization of both sides of bowel (Rigler sign)and outlining portions of the liver. Scoliotic curvature of the lumbar spine convex to the left is noted. Metallic clips project over the mid pelvis. IMPRESSION: 1. Satisfactory gastric tube position with tip and side-port in the expected location of the stomach just beneath the left hemidiaphragm. 2. There is free intraperitoneal air outlining bowel loops as well as dome of  the liver. Tracking of pneumomediastinum is seen extending into the upper abdomen outlining portions of the right kidney as well. Overlying soft tissue emphysema along the right hemiabdomen and included lower thorax. These results were called by telephone at the time of interpretation on 08/17/2017 at 6:40 pm to Dr. Pricilla Loveless , who verbally acknowledged these results. Electronically Signed   By: Tollie Eth M.D.   On: 08/17/2017 18:40   Ct Chest W Contrast  Result Date: 08/17/2017 CLINICAL DATA:  Pneumomediastinum. Post EGD and colonoscopy today with possible Largen reaction. EXAM: CT CHEST, ABDOMEN, AND PELVIS WITH CONTRAST TECHNIQUE: Multidetector CT imaging of the chest, abdomen and pelvis was performed following the standard protocol during bolus administration of intravenous contrast. CONTRAST:  ISOVUE-300 IOPAMIDOL (ISOVUE-300) INJECTION 61% COMPARISON:  Chest radiographs earlier this day. Abdominal CT 03/07/2015 FINDINGS: CT CHEST FINDINGS Cardiovascular: Thoracic aorta is normal in caliber. No central pulmonary embolus. Heart is normal in size. No pericardial effusion. Mediastinum/Nodes: Extensive pneumomediastinum throughout the chest. Endotracheal and  enteric tubes in place. No obvious esophageal wall thickening. No enlarged mediastinal or hilar lymph nodes. Lungs/Pleura: Moderate to large left and small to moderate right pneumothorax. Consolidations dependently within the left greater than right lung and dependent right upper lobe may be aspiration or atelectasis. Musculoskeletal: Extensive subcutaneous emphysema about the chest wall extending into the neck, right breast, and both upper extremities. Scoliosis and degenerative change in the spine. CT ABDOMEN PELVIS FINDINGS Hepatobiliary: No focal hepatic lesion. Gallbladder physiologically distended, no calcified stone. No biliary dilatation. Pancreas: No ductal dilatation or inflammation. Spleen: Normal in size without focal abnormality. Adrenals/Urinary Tract: Normal adrenal glands. Extraperitoneal air tracks in the retroperitoneum about the right kidney. No hydronephrosis or perinephric edema. Homogeneous renal enhancement with symmetric excretion on delayed phase imaging. Simple cyst in the posterior left kidney. Urinary bladder is distended without wall thickening. Stomach/Bowel: Free intraperitoneal and extraperitoneal air in the abdomen and pelvis. In stomach is decompressed with enteric tube in place. No obvious small bowel inflammation. Gaseous distention of the proximal colon from colonoscopy earlier this day, with relatively decompressed descending and sigmoid colon. There are two linear metallic densities within the distal sigmoid colon. Extraperitoneal air adjacent to the rectum and in the perirectal perineal subcutaneous tissues. An obvious bowel defect as source of air is not confidently visualized. Vascular/Lymphatic: Mild aortic atherosclerosis without aneurysm. No obvious gas in the mesenteric vasculature. Calcified mesenteric nodes are unchanged from prior exam. Limited assessment for adenopathy given degree of extraperitoneal air. Reproductive: Uterus and bilateral adnexa are unremarkable.  Other: Extensive subcutaneous emphysema about the abdominal wall, greater on the right. Extraperitoneal air visualized in the pelvis, and throughout the retroperitoneum (right greater than left). There is a small-moderate amount of intraperitoneal air anteriorly. Scattered air throughout the lower mesentery. No definite ascites. Musculoskeletal: Scoliosis and degenerative change in the spine. IMPRESSION: 1. Extensive pneumomediastinum. Moderate to large left and small to moderate right pneumothorax. Extensive chest wall subcutaneous emphysema. 2. Extensive extraperitoneal air in the abdomen and pelvis, greater on the right. Free intraperitoneal air. Extensive subcutaneous emphysema about the abdomen and pelvis. Subcutaneous emphysema in the perineum and perirectal soft tissues. 3. No obvious bowel perforation as cause of abnormal air. Colonic distention likely related to recent colonoscopy. Metallic densities in the distal sigmoid colon are presumed clips from colonoscopy. 4. Unchanged calcified lymph nodes in the small bowel mesentery. Critical Value/emergent results were called by telephone at the time of interpretation on 08/17/2017 at approximately 9:35 pm to  Dr. Pricilla Loveless , who verbally acknowledged these results. Electronically Signed   By: Rubye Oaks M.D.   On: 08/17/2017 21:58   Ct Abdomen Pelvis W Contrast  Result Date: 08/17/2017 CLINICAL DATA:  Pneumomediastinum. Post EGD and colonoscopy today with possible Largen reaction. EXAM: CT CHEST, ABDOMEN, AND PELVIS WITH CONTRAST TECHNIQUE: Multidetector CT imaging of the chest, abdomen and pelvis was performed following the standard protocol during bolus administration of intravenous contrast. CONTRAST:  ISOVUE-300 IOPAMIDOL (ISOVUE-300) INJECTION 61% COMPARISON:  Chest radiographs earlier this day. Abdominal CT 03/07/2015 FINDINGS: CT CHEST FINDINGS Cardiovascular: Thoracic aorta is normal in caliber. No central pulmonary embolus. Heart is  normal in size. No pericardial effusion. Mediastinum/Nodes: Extensive pneumomediastinum throughout the chest. Endotracheal and enteric tubes in place. No obvious esophageal wall thickening. No enlarged mediastinal or hilar lymph nodes. Lungs/Pleura: Moderate to large left and small to moderate right pneumothorax. Consolidations dependently within the left greater than right lung and dependent right upper lobe may be aspiration or atelectasis. Musculoskeletal: Extensive subcutaneous emphysema about the chest wall extending into the neck, right breast, and both upper extremities. Scoliosis and degenerative change in the spine. CT ABDOMEN PELVIS FINDINGS Hepatobiliary: No focal hepatic lesion. Gallbladder physiologically distended, no calcified stone. No biliary dilatation. Pancreas: No ductal dilatation or inflammation. Spleen: Normal in size without focal abnormality. Adrenals/Urinary Tract: Normal adrenal glands. Extraperitoneal air tracks in the retroperitoneum about the right kidney. No hydronephrosis or perinephric edema. Homogeneous renal enhancement with symmetric excretion on delayed phase imaging. Simple cyst in the posterior left kidney. Urinary bladder is distended without wall thickening. Stomach/Bowel: Free intraperitoneal and extraperitoneal air in the abdomen and pelvis. In stomach is decompressed with enteric tube in place. No obvious small bowel inflammation. Gaseous distention of the proximal colon from colonoscopy earlier this day, with relatively decompressed descending and sigmoid colon. There are two linear metallic densities within the distal sigmoid colon. Extraperitoneal air adjacent to the rectum and in the perirectal perineal subcutaneous tissues. An obvious bowel defect as source of air is not confidently visualized. Vascular/Lymphatic: Mild aortic atherosclerosis without aneurysm. No obvious gas in the mesenteric vasculature. Calcified mesenteric nodes are unchanged from prior exam.  Limited assessment for adenopathy given degree of extraperitoneal air. Reproductive: Uterus and bilateral adnexa are unremarkable. Other: Extensive subcutaneous emphysema about the abdominal wall, greater on the right. Extraperitoneal air visualized in the pelvis, and throughout the retroperitoneum (right greater than left). There is a small-moderate amount of intraperitoneal air anteriorly. Scattered air throughout the lower mesentery. No definite ascites. Musculoskeletal: Scoliosis and degenerative change in the spine. IMPRESSION: 1. Extensive pneumomediastinum. Moderate to large left and small to moderate right pneumothorax. Extensive chest wall subcutaneous emphysema. 2. Extensive extraperitoneal air in the abdomen and pelvis, greater on the right. Free intraperitoneal air. Extensive subcutaneous emphysema about the abdomen and pelvis. Subcutaneous emphysema in the perineum and perirectal soft tissues. 3. No obvious bowel perforation as cause of abnormal air. Colonic distention likely related to recent colonoscopy. Metallic densities in the distal sigmoid colon are presumed clips from colonoscopy. 4. Unchanged calcified lymph nodes in the small bowel mesentery. Critical Value/emergent results were called by telephone at the time of interpretation on 08/17/2017 at approximately 9:35 pm to Dr. Pricilla Loveless , who verbally acknowledged these results. Electronically Signed   By: Rubye Oaks M.D.   On: 08/17/2017 21:58   Dg Chest Port 1 View  Result Date: 08/19/2017 CLINICAL DATA:  Endotracheal tube EXAM: PORTABLE CHEST 1 VIEW COMPARISON:  08/18/2017 FINDINGS:  Endotracheal tube no longer visualized.  NG tube removed. Bilateral pigtail chest tubes are present unchanged in position. Negative for pneumothorax. Moderate subcutaneous emphysema bilaterally has improved. Mild bibasilar atelectasis left greater than right is unchanged. Small left effusion unchanged. Cardiac and mediastinal contours normal IMPRESSION:  Endotracheal tube removed. Bilateral chest tubes in place without pneumothorax. Improvement in subcutaneous emphysema. Electronically Signed   By: Marlan Palau M.D.   On: 08/19/2017 06:34   Dg Chest Port 1 View  Result Date: 08/18/2017 CLINICAL DATA:  Hypoxia EXAM: PORTABLE CHEST 1 VIEW COMPARISON:  August 17, 2017 chest radiograph and CT chest and abdomen August 17, 2017 FINDINGS: Endotracheal tube tip is 1.9 cm above the carina. Nasogastric tube tip and side port are in the stomach. There are chest tubes bilaterally. No pneumothorax is appreciable by radiography on the left. There is a small pneumothorax on the right in the apical regions seen radiographically. Widespread pneumomediastinum as well as subcutaneous emphysema and pneumoperitoneum are visualized radiographically, better seen on recent CT. There is patchy consolidation in the left lung base. No new airspace opacity evident. Heart size normal. Pulmonary vascularity normal. No evident adenopathy. IMPRESSION: Tube and catheter positions as described. Small pneumothorax seen on the right. No pneumothorax seen on the left. Persistent consolidation left lung base. Extensive pneumomediastinum and subcutaneous emphysema noted. Pneumoperitoneum is appreciable, better seen on recent CT. Stable cardiac silhouette. Electronically Signed   By: Bretta Bang III M.D.   On: 08/18/2017 07:16   Dg Chest Port 1 View  Result Date: 08/18/2017 CLINICAL DATA:  Post chest tube insertion. EXAM: PORTABLE CHEST 1 VIEW COMPARISON:  CT and radiographs earlier this day. FINDINGS: Bilateral pigtail catheters have been placed. Pneumothoraces on CT are not well visualized radiographically detail large amount of overlying subcutaneous emphysema. Endotracheal tube 2.1 cm from the carina.  Enteric tube in place. Extensive pneumomediastinum, subcutaneous emphysema, and free and retroperitoneal air in the upper abdomen, as seen on CT. IMPRESSION: 1. Placement of bilateral  pigtail catheters. Bilateral pneumothoraces are not well evaluated radiographically (visualized only on CT). 2. Again seen extensive pneumomediastinum, retroperitoneal and free air in the upper abdomen. Electronically Signed   By: Rubye Oaks M.D.   On: 08/18/2017 00:11   Dg Chest Portable 1 View  Result Date: 08/17/2017 CLINICAL DATA:  Evaluate orogastric tube after repositioning. EXAM: PORTABLE CHEST 1 VIEW COMPARISON:  Immediate preceding chest radiograph performed at 1642 hours FINDINGS: Diffuse subcutaneous emphysema is noted with pneumomediastinum as before. Heart size remains stable. No mediastinal widening. There is aortic atherosclerosis. No conclusive evidence for pneumo thorax though difficult to identify due to the extensive overlying soft tissue emphysema. Low-lying endotracheal tube is again seen with tip at 1.6 cm above the carina. Gastric tube is seen extending below the left hemidiaphragm however the tip and side-port are still not visualized on repeat imaging. Curvilinear lucency medial to the dome of the right hemidiaphragm may either represent a small focus of atelectasis, continuation of the patient's known pneumomediastinum or potentially a small focus of pneumoperitoneum. Stable dextroconvex curvature of the dorsal spine at the thoracolumbar junction. IMPRESSION: 1. Low-lying endotracheal tube tip 1.6 cm above the carina. 2. Nonvisualization the tip and side port of an indwelling gastric tube. The tube does extend below the left hemidiaphragm into the abdomen. 3. Extensive subcutaneous emphysema as before with pneumomediastinum. Crescentic lucency adjacent to the right hemidiaphragm may represent continuation of the pneumomediastinum, linear atelectasis at the right lung base or possibly tiny pneumoperitoneum. Abdomen radiographs  may help for better assessment of the gastric tube as well as for assessment of possible air beneath the diaphragm (ordered at time of dictation).  Electronically Signed   By: Tollie Eth M.D.   On: 08/17/2017 18:32   Dg Chest Portable 1 View  Result Date: 08/17/2017 CLINICAL DATA:  Endotracheal and gastric tube placement EXAM: PORTABLE CHEST 1 VIEW COMPARISON:  04/23/2014 FINDINGS: Extensive subcutaneous emphysema is seen at the base of the neck and overlying the thorax as well as upper abdomen. The tip of an endotracheal tube is 1.7 cm above the carina. A gastric tube extends into the expected location of the stomach however the tip and side port are excluded on the current exam. Heart is normal in size. There is aortic atherosclerosis. Pneumomediastinum outlining the aortic arch and left heart border as well as base of heart is seen. Crescentic lucencies are seen at the lung bases that may either be related to the pneumomediastinum although the possibility of small amount of free air is not excluded as well. Dextroconvex curvature is stable at the thoracolumbar junction. IMPRESSION: 1. Low lying endotracheal tube approximately 1.7 cm above the carina. Pullback at least 2 cm suggested. 2. Gastric tube tip and side port are excluded on the current exam but extends below the left hemidiaphragm. 3. Diffuse extensive subcutaneous emphysema with pneumomediastinum. 4. There are small crescentic lucencies at the base of the lungs which in part likely represents continuation of the pneumomediastinum although the possibility of a tiny amount of free intraperitoneal air beneath the diaphragm is not excluded, especially on the right. Electronically Signed   By: Tollie Eth M.D.   On: 08/17/2017 17:49    Speciality Comments: No specialty comments available.    Procedures:  No procedures performed Allergies: Ciprofloxacin; Propofol; Terbinafine; and Prednisone   Assessment / Plan:     Visit Diagnoses: Rheumatoid arthritis involving multiple sites with positive rheumatoid factor (HCC) - negative anti-CCP: No synovitis on exam.  Her hand pain is due to her  osteoarthritis.  She has PIP and DIP synovial thickening consistent with osteoarthritis.  She will continue on Plaquenil 200 mg 1 tablet po daily.  Labs were performed on 08/21/17.  PLQ eye exam was performed on 09/08/17.  High risk medication use - PLQ: CBC and CMP were performed on 08/21/17.  PLQ eye exam was performed 09/08/17.  No change in therapy was necessary at this time.    Sicca syndrome, unspecified (HCC): She continues to have sicca symptoms.  She uses Restasis drops daily.    Primary osteoarthritis of both hands: PIP and DIP synovial thickening consistent with osteoarthritis.  No synovitis on exam.    Iliotibial band syndrome, bilateral: She has bilateral trochanteric bursitis and IT band syndrome.  She was given a handout for exercises she can perform at home.     Other medical conditions are listed as follows:   Osteopenia of multiple sites  History of hypothyroidism  History of gastroesophageal reflux (GERD) -  Barretts Esophogus   History of scoliosis  History of hyperlipidemia  History of diverticulitis    Orders: No orders of the defined types were placed in this encounter.  No orders of the defined types were placed in this encounter.     Follow-Up Instructions: Return in about 5 months (around 02/06/2018) for Rheumatoid arthritis, Osteoarthritis.   Pollyann Savoy, MD  Note - This record has been created using Animal nutritionist.  Chart creation errors have been sought, but may not always  have been located. Such creation errors do not reflect on  the standard of medical care.

## 2017-08-31 ENCOUNTER — Ambulatory Visit
Admission: RE | Admit: 2017-08-31 | Discharge: 2017-08-31 | Disposition: A | Discharge status: Home / Self Care | Payer: Medicare Other | Source: Ambulatory Visit | Attending: Family Medicine | Admitting: Family Medicine

## 2017-08-31 ENCOUNTER — Other Ambulatory Visit: Payer: Self-pay | Admitting: Family Medicine

## 2017-08-31 DIAGNOSIS — Z09 Encounter for follow-up examination after completed treatment for conditions other than malignant neoplasm: Secondary | ICD-10-CM

## 2017-09-01 ENCOUNTER — Ambulatory Visit: Payer: Medicare Other | Admitting: Rheumatology

## 2017-09-03 ENCOUNTER — Other Ambulatory Visit: Payer: Self-pay | Admitting: Rheumatology

## 2017-09-03 NOTE — Telephone Encounter (Signed)
03/22/17 last visit  09/09/17 next visit Labs: 08/21/16 stable PLQ Eye Exam: WNL in 01/2017  Okay to refill per Dr. Corliss Skains

## 2017-09-09 ENCOUNTER — Encounter: Payer: Self-pay | Admitting: Rheumatology

## 2017-09-09 ENCOUNTER — Ambulatory Visit: Payer: Medicare Other | Admitting: Rheumatology

## 2017-09-09 VITALS — BP 110/68 | HR 78 | Resp 16 | Ht 61.5 in | Wt 144.5 lb

## 2017-09-09 DIAGNOSIS — M19041 Primary osteoarthritis, right hand: Secondary | ICD-10-CM

## 2017-09-09 DIAGNOSIS — Z8639 Personal history of other endocrine, nutritional and metabolic disease: Secondary | ICD-10-CM | POA: Diagnosis not present

## 2017-09-09 DIAGNOSIS — Z8719 Personal history of other diseases of the digestive system: Secondary | ICD-10-CM | POA: Diagnosis not present

## 2017-09-09 DIAGNOSIS — Z79899 Other long term (current) drug therapy: Secondary | ICD-10-CM

## 2017-09-09 DIAGNOSIS — M19042 Primary osteoarthritis, left hand: Secondary | ICD-10-CM

## 2017-09-09 DIAGNOSIS — Z8739 Personal history of other diseases of the musculoskeletal system and connective tissue: Secondary | ICD-10-CM | POA: Diagnosis not present

## 2017-09-09 DIAGNOSIS — M3500 Sicca syndrome, unspecified: Secondary | ICD-10-CM

## 2017-09-09 DIAGNOSIS — M7630 Iliotibial band syndrome, unspecified leg: Secondary | ICD-10-CM

## 2017-09-09 DIAGNOSIS — M0579 Rheumatoid arthritis with rheumatoid factor of multiple sites without organ or systems involvement: Secondary | ICD-10-CM | POA: Diagnosis not present

## 2017-09-09 DIAGNOSIS — M8589 Other specified disorders of bone density and structure, multiple sites: Secondary | ICD-10-CM

## 2017-09-09 NOTE — Patient Instructions (Signed)
Iliotibial Band Syndrome Rehab  Ask your health care provider which exercises are safe for you. Do exercises exactly as told by your health care provider and adjust them as directed. It is normal to feel mild stretching, pulling, tightness, or discomfort as you do these exercises, but you should stop right away if you feel sudden pain or your pain gets worse. Do not begin these exercises until told by your health care provider.  Stretching and range of motion exercises  These exercises warm up your muscles and joints and improve the movement and flexibility of your hip and pelvis.  Exercise A: Quadriceps, prone    1. Lie on your abdomen on a firm surface, such as a bed or padded floor.  2. Bend your left / right knee and hold your ankle. If you cannot reach your ankle or pant leg, loop a belt around your foot and grab the belt instead.  3. Gently pull your heel toward your buttocks. Your knee should not slide out to the side. You should feel a stretch in the front of your thigh and knee.  4. Hold this position for __________ seconds.  Repeat __________ times. Complete this stretch __________ times a day.  Exercise B: Iliotibial band    1. Lie on your side with your left / right leg in the top position.  2. Bend both of your knees and grab your left / right ankle. Stretch out your bottom arm to help you balance.  3. Slowly bring your top knee back so your thigh goes behind your trunk.  4. Slowly lower your top leg toward the floor until you feel a gentle stretch on the outside of your left / right hip and thigh. If you do not feel a stretch and your knee will not fall farther, place the heel of your other foot on top of your knee and pull your knee down toward the floor with your foot.  5. Hold this position for __________ seconds.  Repeat __________ times. Complete this stretch __________ times a day.  Strengthening exercises  These exercises build strength and endurance in your hip and pelvis. Endurance is the  ability to use your muscles for a long time, even after they get tired.  Exercise C: Straight leg raises (  hip abductors)  1. Lie on your side with your left / right leg in the top position. Lie so your head, shoulder, knee, and hip line up. You may bend your bottom knee to help you balance.  2. Roll your hips slightly forward so your hips are stacked directly over each other and your left / right knee is facing forward.  3. Tense the muscles in your outer thigh and lift your top leg 4-6 inches (10-15 cm).  4. Hold this position for __________ seconds.  5. Slowly return to the starting position. Let your muscles relax completely before doing another repetition.  Repeat __________ times. Complete this exercise __________ times a day.  Exercise D: Straight leg raises (  hip extensors)  1. Lie on your abdomen on your bed or a firm surface. You can put a pillow under your hips if that is more comfortable.  2. Bend your left / right knee so your foot is straight up in the air.  3. Squeeze your buttock muscles and lift your left / right thigh off the bed. Do not let your back arch.  4. Tense this muscle as hard as you can without increasing any knee pain.    5. Hold this position for __________ seconds.  6. Slowly lower your leg to the starting position and allow it to relax completely.  Repeat __________ times. Complete this exercise __________ times a day.  Exercise E: Hip hike  1. Stand sideways on a bottom step. Stand on your left / right leg with your other foot unsupported next to the step. You can hold onto the railing or wall if needed for balance.  2. Keep your knees straight and your torso square. Then, lift your left / right hip up toward the ceiling.  3. Slowly let your left / right hip lower toward the floor, past the starting position. Your foot should get closer to the floor. Do not lean or bend your knees.  Repeat __________ times. Complete this exercise __________ times a day.  This information is not  intended to replace advice given to you by your health care provider. Make sure you discuss any questions you have with your health care provider.  Document Released: 07/27/2005 Document Revised: 03/31/2016 Document Reviewed: 06/28/2015  Elsevier Interactive Patient Education © 2018 [employer]

## 2017-09-14 ENCOUNTER — Other Ambulatory Visit: Payer: Self-pay

## 2017-09-14 DIAGNOSIS — Z79899 Other long term (current) drug therapy: Secondary | ICD-10-CM

## 2017-10-12 ENCOUNTER — Ambulatory Visit: Payer: Medicare Other | Admitting: Internal Medicine

## 2017-10-12 ENCOUNTER — Encounter: Payer: Self-pay | Admitting: Internal Medicine

## 2017-10-12 VITALS — BP 110/70 | HR 78 | Ht 61.5 in | Wt 143.0 lb

## 2017-10-12 DIAGNOSIS — R05 Cough: Secondary | ICD-10-CM

## 2017-10-12 DIAGNOSIS — S270XXS Traumatic pneumothorax, sequela: Secondary | ICD-10-CM | POA: Diagnosis not present

## 2017-10-12 DIAGNOSIS — R058 Other specified cough: Secondary | ICD-10-CM

## 2017-10-12 NOTE — Patient Instructions (Addendum)
Stop krill oil and eat more fish   Nexium 20 mg Take 30- 60 min before your first and last meals x 6 weeks minimum    GERD (REFLUX)  is an extremely common cause of respiratory symptoms just like yours , many times with no obvious heartburn at all.    It can be treated with medication, but also with lifestyle changes including elevation of the head of your bed (ideally with 6 inch  bed blocks),  Smoking cessation, avoidance of late meals, excessive alcohol, and avoid fatty foods, chocolate, peppermint, colas, red wine, and acidic juices such as orange juice.  NO MINT OR MENTHOL PRODUCTS SO NO COUGH DROPS  USE SUGARLESS CANDY INSTEAD (Jolley ranchers or Stover's or Life Savers) or even ice chips will also do - the key is to swallow to prevent all throat clearing. NO OIL BASED VITAMINS - use powdered substitutes.     If not all better in 6 weeks, call me for referral to ENT  - otherwise follow up is as needed

## 2017-10-12 NOTE — Progress Notes (Signed)
Subjective:     Patient ID: Linda Berg, female   DOB: January 18, 1946     MRN: [id number]  HPI  56 yowf never smoker with EGD  08/17/16 with resp arrest ? anaphylaxis to Propafol with difficult airway >    Admit date: 08/17/2017 Discharge date: 08/21/2017  Discharge Diagnoses:  Active Problems:   Acute respiratory failure (HCC)   Pneumothorax, traumatic   Pneumomediastinum (HCC)   Pneumoperitoneum   subcutaneous emphysema   Rheumatoid arthritis  Discharge Condition: stable  Diet recommendation: soft diet       Filed Weights   08/19/17 2041 08/20/17 0445 08/21/17 0517  Weight: 56.3 kg (124 lb 1.9 oz) 56.3 kg (124 lb 1.9 oz) 56.3 kg (124 lb 1.9 oz)    History of present illness:  72 year old female,admitted with pneumoperitoneum, pneumomediastinum and subcutaneous emphysema following colonoscopy.  Hospital Course:   Acute respiratory failure with pneumomediastinum, pneumothorax, pneumoperitoneum and subcutaneous emphysema -Developed following elective colonoscopyand EGD -Initially anaphylaxis to propofol was suspected,however she had novocal cord edemaon intubationor other signs of anaphylaxis - the other possibility is microperforation following colonoscopy with resultant complications,she had free intraperitoneal air on admission CT scan,which resulted in all of the above,no obvious perforated viscus noted  -Regardless clinically improved rapidly, was in ICU/intubated for first 48hours -Status post extubation -Pneumothorax resolved following chest tube, last x-ray with still mild pneumomediastinum -diet gradually advanced to soft diet and tolerating this -Changed to Zosyn to Augmentin to complete 7 day course -Recommend Follow-up chest x-ray in one week -discharged home in stable condition, advised FU with PCP in 1 week  RA -continue plaquenil     Consultations:  GI  CCS  PCCM/Pulm  Discharge Exam:     Vitals:   08/20/17 2124  08/21/17 0517  BP: 99/60 110/65  Pulse: 96 81  Resp: 18 18  Temp: 98.2 F (36.8 C) 98.2 F (36.8 C)  SpO2: 96% 96%    General: AAOx3 Cardiovascular: S1S2/RRR Respiratory: CTAB  Discharge Instructions       Discharge Instructions    Diet - low sodium heart healthy   Complete by:  As directed    Increase activity slowly   Complete by:  As directed          Allergies as of 08/21/2017      Reactions   Ciprofloxacin Anaphylaxis   Facial numbness   Propofol Anaphylaxis   Terbinafine Rash   Prednisone Other (See Comments)   Stomach spasms              Medication List     STOP taking these medications   metoCLOPramide 10 MG tablet Commonly known as:  REGLAN     TAKE these medications   AMBULATORY NON FORMULARY MEDICATION ALLERGY SHOTS Once a week   amoxicillin-clavulanate 875-125 MG tablet Commonly known as:  AUGMENTIN Take 1 tablet by mouth every 12 (twelve) hours. For 6days   aspirin 81 MG tablet Take 81 mg by mouth 2 (two) times daily.   azelastine 0.1 % nasal spray Commonly known as:  ASTELIN Place 1 spray into the nose 2 (two) times daily. Use in each nostril as directed   Biotin 5000 MCG Tabs Take 1 tablet by mouth 1 day or 1 dose.   cetirizine 10 MG tablet Commonly known as:  ZYRTEC Take 10 mg by mouth at bedtime.   cholecalciferol 1000 units tablet Commonly known as:  VITAMIN D Take 1,000 Units by mouth daily.   Co Q 10 100 MG  Caps Take 1 capsule by mouth 2 (two) times daily.   cycloSPORINE 0.05 % ophthalmic emulsion Commonly known as:  RESTASIS Place 1 drop into both eyes 2 (two) times daily.   famotidine 20 MG tablet Commonly known as:  PEPCID Take 20 mg by mouth daily.   hydroxychloroquine 200 MG tablet Commonly known as:  PLAQUENIL TAKE 1 TABLET BY MOUTH  DAILY   Krill Oil 300 MG Caps Take 1 capsule by mouth daily.   lansoprazole 15 MG capsule Commonly known as:  PREVACID Take 15 mg by  mouth daily at 12 noon.   lovastatin 20 MG tablet Commonly known as:  MEVACOR Take 20 mg by mouth daily.   Magnesium 250 MG Tabs Take 1 tablet by mouth 2 (two) times daily.   PROBIOTIC DAILY PO Take 1 tablet by mouth daily.   SUPER B COMPLEX PO Take 1 tablet by mouth daily.   thyroid 60 MG tablet Commonly known as:  ARMOUR Take 60 mg by mouth daily before breakfast.   vitamin C 500 MG tablet Commonly known as:  ASCORBIC ACID Take 1,000 mg by mouth 2 (two) times daily.   Zinc 50 MG Caps Take by mouth.      10/12/2017 1st [facility] office visit/ Evelynne Spiers   Chief Complaint  Patient presents with  . Pulmonary Consult    Referred by Dr. Mady Gemma for eval of abnormal cxr. Pt states had lung collapse during her endo and colonoscopy. She had chest tubes bilaterally. She has some pain on the left where chest tube was placed.   vs d/c from [facility] she has developed a mild dry day >> noct cough assoc with urge to clear the throat and  1)  Sore throat  50% improved 2)  Some voice fatigue 3)  Back to baseline/ housework ok / steps 4) CP left ct side 75% improved  5) throat grabs her spont x 2 weeks prior to OV but none since   6) last got choked on water maybe a month prior to OV and swallowing everything fine now    She has Barrett's and overt HB from which she is only taking otc's inconsistently at present  No obvious day to day or daytime variability or assoc excess/ purulent sputum or mucus plugs or hemoptysis or cp or chest tightness, subjective wheeze or overt sinus   symptoms. No unusual exposure hx or h/o childhood pna/ asthma or knowledge of premature birth.  Sleeping ok flat without nocturnal  or early am exacerbation  of respiratory  c/o's or need for noct saba. Also denies any obvious fluctuation of symptoms with weather or environmental changes or other aggravating or alleviating factors except as outlined above   Current Allergies, Complete Past Medical  History, Past Surgical History, Family History, and Social History were reviewed in [employer] record.  ROS  The following are not active complaints unless bolded Hoarseness, sore throat, dysphagia, dental problems, itching, sneezing,  nasal congestion or discharge of excess mucus or purulent secretions, ear ache,   fever, chills, sweats, unintended wt loss or wt gain, classically pleuritic or exertional cp,  orthopnea pnd or leg swelling, presyncope, palpitations, abdominal pain, anorexia, nausea, vomiting, diarrhea  or change in bowel habits or change in bladder habits, change in stools or change in urine, dysuria, hematuria,  rash, arthralgias, visual complaints, headache, numbness, weakness or ataxia or problems with walking or coordination,  change in mood/affect or memory.  Current Meds  Medication Sig  . AMBULATORY NON FORMULARY MEDICATION ALLERGY SHOTS Once a week  . aspirin 81 MG tablet Take 81 mg by mouth 2 (two) times daily.  Marland Kitchen azelastine (ASTELIN) 137 MCG/SPRAY nasal spray Place 1 spray into the nose as needed. Use in each nostril as directed   . B Complex-C (SUPER B COMPLEX PO) Take 1 tablet by mouth daily.  . Biotin 5000 MCG TABS Take 1 tablet by mouth 1 day or 1 dose.   . cetirizine (ZYRTEC) 10 MG tablet Take 10 mg by mouth at bedtime.  . cholecalciferol (VITAMIN D) 1000 units tablet Take 1,000 Units by mouth daily.  . Coenzyme Q10 (CO Q 10) 100 MG CAPS Take 1 capsule by mouth 2 (two) times daily.  . cycloSPORINE (RESTASIS) 0.05 % ophthalmic emulsion Place 1 drop into both eyes 2 (two) times daily.  . hydroxychloroquine (PLAQUENIL) 200 MG tablet TAKE 1 TABLET BY MOUTH  DAILY  . Krill Oil 300 MG CAPS Take 1 capsule by mouth daily.  . lansoprazole (PREVACID) 15 MG capsule Take 15 mg by mouth daily at 12 noon.  . lovastatin (MEVACOR) 20 MG tablet Take 20 mg by mouth daily.  . Magnesium 250 MG TABS Take 1 tablet by mouth 2 (two) times daily.  .  Probiotic Product (PROBIOTIC DAILY PO) Take 1 tablet by mouth daily.   Marland Kitchen thyroid (ARMOUR) 60 MG tablet Take 60 mg by mouth daily before breakfast.  . vitamin C (ASCORBIC ACID) 500 MG tablet Take 1,000 mg by mouth 2 (two) times daily.   . Zinc 50 MG CAPS Take by mouth.      Review of Systems     Objective:   Physical Exam  amb wf with mild voice fatigue/ quite anxious  Wt Readings from Last 3 Encounters:  10/12/17 143 lb (64.9 kg)  09/09/17 144 lb 8 oz (65.5 kg)  08/21/17 124 lb 1.9 oz (56.3 kg)     Vital signs reviewed - Note on arrival 02 sats  94% on RA     HEENT: nl dentition, turbinates bilaterally, and oropharynx. Nl external ear canals without cough reflex   NECK :  without JVD/Nodes/TM/ nl carotid upstrokes bilaterally   LUNGS: no acc muscle use,  Nl contour chest which is clear to A and P bilaterally without cough on insp or exp maneuvers   CV:  RRR  no s3 or murmur or increase in P2, and no edema   ABD:  soft and nontender with nl inspiratory excursion in the supine position. No bruits or organomegaly appreciated, bowel sounds nl  MS:  Nl gait/ ext warm without deformities, calf tenderness, cyanosis or clubbing No obvious joint restrictions   SKIN: warm and dry without lesions  - no obvious issues with iv sites or ct entry wounds all  healed up nicely   NEURO:  alert, approp, nl sensorium with  no motor or cerebellar deficits apparent.     I personally reviewed images and agree with radiology impression as follows:  CXR:   08/31/17  No residual pneumothorax or pneumomediastinum or subcutaneous emphysema. Stable biapical pleural thickening likely reflects previous granulomatous infection. No acute cardiopulmonary Abnormality. My review:  The pleural changes are chronic and of no consequence        Assessment:

## 2017-10-13 ENCOUNTER — Encounter: Payer: Self-pay | Admitting: Internal Medicine

## 2017-10-13 DIAGNOSIS — R05 Cough: Secondary | ICD-10-CM | POA: Insufficient documentation

## 2017-10-13 DIAGNOSIS — R058 Other specified cough: Secondary | ICD-10-CM | POA: Insufficient documentation

## 2017-10-13 NOTE — Assessment & Plan Note (Signed)
This problem has completely resolved and no further w/u need.  I doubt she ever had a perforated viscus as she healed so quickly s persistent air leaks after tube placement so it may be the air dissected down around the pyriform sinuses during efforts to bag her with clenched jaws during the resp arrest which was alleviate by versed prior to attempting oral intubation but it's very difficult to be sure.    The good news is that all her problems resolved p ET /ct placement and she should expect 100% recovery   Total time devoted to counseling  > 50 % of initial 60 min office visit:  review entire case since the events of  08/17/17 with pt/husband/ discussion of options/alternatives/ personally creating written customized instructions  in presence of pt  then going over those specific  Instructions directly with the pt including how to use all of the meds but in particular covering each new medication in detail and the difference between the maintenance= "automatic" meds and the prns using an action plan format for the latter (If this problem/symptom => do that organization reading Left to right).  Please see AVS from this visit for a full list of these instructions which I personally wrote for this pt and  are unique to this visit.

## 2017-10-13 NOTE — Assessment & Plan Note (Signed)
Provoked by ET emergently 08/17/17 p apparent reaction to propofol for EGD for Barretts  Upper airway cough syndrome (previously labeled PNDS),  is so named because it's frequently impossible to sort out how much is  CR/sinusitis with freq throat clearing (which can be related to primary GERD)   vs  causing  secondary (" extra esophageal")  GERD from wide swings in gastric pressure that occur with throat clearing, often  promoting self use of mint and menthol lozenges that reduce the lower esophageal sphincter tone and exacerbate the problem further in a cyclical fashion.   These are the same pts (now being labeled as having "irritable larynx syndrome" by some cough centers) who not infrequently have a history of having failed to tolerate ace inhibitors,  dry powder inhalers or biphosphonates or report having atypical/extraesophageal reflux symptoms that don't respond to standard doses of PPI  and are easily confused as having aecopd or asthma flares by even experienced allergists/ pulmonologists (myself included).   Note of the three most common causes of  Sub-acute or recurrent or chronic cough, only one (GERD)  can actually contribute to/ trigger  the other two (asthma and post nasal drip syndrome)  and perpetuate the cylce of cough.  While not intuitively obvious, many patients with chronic low grade reflux do not cough until there is a primary insult that disturbs the protective epithelial barrier and exposes sensitive nerve endings.   This is typically viral but can be direct physical injury such as with an endotracheal tube as likely was the case here.     The point is that once this occurs, it is difficult to eliminate the cycle  using anything but a maximally effective acid suppression regimen at least in the short run, accompanied by an appropriate diet to address non acid GERD and control the urge to cough by try to use non-mint and menthol hard rock candy to promote swallowing and avoid all  throat clearing if possible  If not effective next step is ENT referral

## 2017-12-01 ENCOUNTER — Other Ambulatory Visit: Payer: Self-pay | Admitting: Rheumatology

## 2017-12-01 NOTE — Telephone Encounter (Signed)
Last Visit: 09/09/17 Next Visit: 02/01/18 Labs: 09/06/17 WBC 3.3 previous 6.5  PLQ Eye Exam: 09/08/2017 Normal  Okay to refill per Dr. Corliss Skains

## 2018-01-14 ENCOUNTER — Other Ambulatory Visit: Payer: Self-pay | Admitting: Obstetrics & Gynecology

## 2018-01-14 DIAGNOSIS — Z1231 Encounter for screening mammogram for malignant neoplasm of breast: Secondary | ICD-10-CM

## 2018-01-17 ENCOUNTER — Telehealth: Payer: Self-pay | Admitting: Rheumatology

## 2018-01-17 DIAGNOSIS — Z79899 Other long term (current) drug therapy: Secondary | ICD-10-CM

## 2018-01-17 NOTE — Telephone Encounter (Signed)
Lab orders released.  

## 2018-01-17 NOTE — Telephone Encounter (Signed)
Patient called requesting her lab orders be sent to [facility] on [address].  Patient states she will be going tomorrow 6/11.

## 2018-01-18 NOTE — Progress Notes (Signed)
Office Visit Note  Patient: Linda Berg             Date of Birth: 1945/08/18           MRN: [id number]             PCP: Richmond Campbell., PA-C Referring: Richmond Campbell., PA-C Visit Date: 02/01/2018 Occupation: @GUAROCC @    Subjective:  Pain in multiple joints   History of Present Illness: Linda Berg is a 72 y.o. female with history of seropositive rheumatoid arthritis, osteoarthritis, and sicca syndrome.  She takes Plaquenil 200 mg 1 tablet by mouth daily.  She had lab work on 01/20/2017.    She reports that she has been having pain in multiple joints including her neck, hands and wrists, bilateral knees, trochanteric bursitis bilaterally, and right SI joint.  She denies any joint swelling.  She states that her stiffness has been lost about 20 minutes in the morning.  She is having increased discomfort in her neck.  She reports that her range of motion is good.  She denies any symptoms of radiculopathy.  She states that she has been having more frequent mild headaches. She also continues to have plantar fasciitis of the left foot.  She seen the orthopedist in the past who recommended stretching and massage.  She has not tried wearing a night boot.  She continues to have bilateral trochanteric bursitis and IT band syndrome.  It is difficult for her to perform exercises due to having difficulty getting on the floor.  She continues to have sicca symptoms.  She uses Restasis which help with her eye dryness.   Activities of Daily Living:  Patient reports morning stiffness for15-20 minutes.   Patient Reports nocturnal pain.  Difficulty dressing/grooming: Denies Difficulty climbing stairs: Denies Difficulty getting out of chair: Denies Difficulty using hands for taps, buttons, cutlery, and/or writing: Denies   Review of Systems  Constitutional: Negative for fatigue and fever.  HENT: Negative for mouth sores, mouth dryness and nose dryness.   Eyes: Negative for pain, visual  disturbance and dryness.  Respiratory: Negative for cough, hemoptysis, shortness of breath and difficulty breathing.   Cardiovascular: Positive for swelling in legs/feet. Negative for chest pain, palpitations and hypertension.  Gastrointestinal: Negative for blood in stool, constipation and diarrhea.  Endocrine: Negative for increased urination.  Genitourinary: Negative for difficulty urinating and painful urination.  Musculoskeletal: Positive for arthralgias, joint pain, myalgias, muscle weakness, morning stiffness and myalgias. Negative for joint swelling and muscle tenderness.  Skin: Positive for hair loss and sensitivity to sunlight. Negative for color change, pallor, rash, nodules/bumps, skin tightness and ulcers.  Allergic/Immunologic: Negative for susceptible to infections.  Neurological: Negative for dizziness, numbness and headaches.  Hematological: Negative for swollen glands.  Psychiatric/Behavioral: Positive for sleep disturbance. Negative for depressed mood. The patient is not nervous/anxious.     PMFS History:  Patient Active Problem List   Diagnosis Date Noted  . Upper airway cough syndrome 10/13/2017  . Pneumomediastinum (HCC)   . Pneumoperitoneum   . Acute respiratory failure (HCC)   . Pneumothorax, traumatic   . Anaphylaxis 08/17/2017  . Trochanteric bursitis of both hips 03/16/2017  . Rheumatoid arthritis involving multiple sites with positive rheumatoid factor (HCC) 10/09/2016  . Sicca syndrome, unspecified (HCC) 10/09/2016  . Osteopenia of multiple sites 10/09/2016  . History of scoliosis 10/09/2016  . History of gastroesophageal reflux (GERD) 10/09/2016  . History of hypothyroidism 10/09/2016  . History of hyperlipidemia 10/09/2016  .  High risk medication use 10/09/2016  . Primary osteoarthritis of both hands 10/09/2016  . Paresthesia 10/11/2014  . Hyperlipidemia 10/11/2014  . Small vessel disease, 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/11/2014  . Tinnitus 09/17/2014  .  Abdominal pain 09/05/2014  . Acute cystitis 09/05/2014  . Arthritis 09/05/2014  . Cough 09/05/2014  . Esophagitis 09/05/2014  . Facial numbness 09/05/2014  . Difficulty hearing 09/05/2014  . H/O disease 09/05/2014  . HLD (hyperlipidemia) 09/05/2014  . Muscle ache 09/05/2014  . Symptoms involving urinary system 09/05/2014  . Awareness of heartbeats 09/05/2014  . Acne erythematosa 09/05/2014  . Absence of bladder continence 09/05/2014  . Urgency of micturation 09/05/2014  . Routine general medical examination at a health care facility 09/05/2014  . DYSPHAGIA 01/09/2009  . HYPOTHYROIDISM 11/04/2007  . DYSLIPIDEMIA 11/04/2007  . GERD 11/04/2007  . 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/04/2007  . DUODENITIS, WITHOUT HEMORRHAGE 11/04/2007  . HIATAL HERNIA 11/04/2007  . DIVERTICULOSIS, COLON 11/04/2007  . CONSTIPATION, CHRONIC 11/04/2007  . RECTAL BLEEDING 11/04/2007    Past Medical History:  Diagnosis Date  . Barrett esophagus   . Diverticulosis   . Dyslipidemia   . Facial numbness   . GERD (gastroesophageal reflux disease)   . Hearing loss   . Hiatal hernia   . Hypothyroidism   . Rheumatoid arteritis     Family History  Problem Relation Age of Onset  . Heart disease Mother   . Stroke Father   . Heart disease Maternal Aunt        aunt x 2  . Irritable bowel syndrome Son   . GER disease Son   . Colon cancer Neg Hx    Past Surgical History:  Procedure Laterality Date  . CHEST TUBE INSERTION  08/17/2017  . COLONOSCOPY  08/17/2017  . INTRAUTERINE DEVICE INSERTION    . IUD REMOVAL    . KNEE ARTHROSCOPY  12/25/11   left  . LEFT OOPHORECTOMY  03/13/98  . MANDIBLE SURGERY  9/93   pallete expansion  . TONSILLECTOMY    . TUBAL LIGATION     Social History   Social History Narrative   Live with husband at home.   Left-handed.   1 cup coffee/day     Objective: Vital Signs: BP 107/66 (BP Location: Left Arm, Patient Position: Sitting, Cuff Size: Normal)   Pulse 78   Ht 5' 1.5"  (1.562 m)   Wt 147 lb (66.7 kg)   BMI 27.33 kg/m    Physical Exam  Constitutional: She is oriented to person, place, and time. She appears well-developed and well-nourished.  HENT:  Head: Normocephalic and atraumatic.  Eyes: Conjunctivae and EOM are normal.  Neck: Normal range of motion.  Cardiovascular: Normal rate, regular rhythm, normal heart sounds and intact distal pulses.  Pulmonary/Chest: Effort normal and breath sounds normal.  Abdominal: Soft. Bowel sounds are normal.  Lymphadenopathy:    She has no cervical adenopathy.  Neurological: She is alert and oriented to person, place, and time.  Skin: Skin is warm and dry. Capillary refill takes less than 2 seconds.  Psychiatric: She has a normal mood and affect. Her behavior is normal.  Nursing note and vitals reviewed.    Musculoskeletal Exam: C-spine good ROM.  Thoracic and lumbar spine good ROM.  No midline spinal tenderness.  Right SI joint tenderness.  Shoulder joints, elbow joints, wrist joints, MCPs, PIPs, DIPs good range of motion no synovitis.  She has PIP and DIP synovial thickening consistent with osteoarthritis of bilateral hands.  No tenderness of  MCP joints.  Hip joints, knee joints, ankle joints, MTPs, PIPs, DIPs good range of motion with no synovitis.  She has pedal edema bilaterally.  No warmth or effusion of bilateral knee joints.  She has bilateral knee crepitus.  She has tenderness of bilateral trochanteric bursa.  She has tenderness along the IT band bilaterally.  CDAI Exam: CDAI Homunculus Exam:   Joint Counts:  CDAI Tender Joint count: 0 CDAI Swollen Joint count: 0  Global Assessments:  Patient Global Assessment: 5 Provider Global Assessment: 5  CDAI Calculated Score: 10    Investigation: No additional findings. CBC Latest Ref Rng & Units 01/20/2018 08/21/2017 08/20/2017  WBC 3.4 - 10.8 x10E3/uL 3.8 5.4 6.5  Hemoglobin 11.1 - 15.9 g/dL 37.3 11.8(L) 10.8(L)  Hematocrit 34.0 - 46.6 % 38.6 35.3(L)  32.9(L)  Platelets 150 - 450 x10E3/uL 233 185 191   CMP Latest Ref Rng & Units 01/20/2018 08/21/2017 08/20/2017  Glucose 65 - 99 mg/dL 76 578(X) 784(R)  BUN 8 - 27 mg/dL 22 12 8   Creatinine 0.57 - 1.00 mg/dL 8.41 2.82 0.81  Sodium 134 - 144 mmol/L 145(H) 138 136  Potassium 3.5 - 5.2 mmol/L 5.0 4.1 2.8(L)  Chloride 96 - 106 mmol/L 108(H) 106 99(L)  CO2 20 - 29 mmol/L 23 26 27   Calcium 8.7 - 10.3 mg/dL 9.4 3.8(I) 8.3(L)  Total Protein 6.0 - 8.5 g/dL 7.2 - -  Total Bilirubin 0.0 - 1.2 mg/dL <7.1 - -  Alkaline Phos 39 - 117 IU/L 113 - -  AST 0 - 40 IU/L 28 - -  ALT 0 - 32 IU/L 35(H) - -     Imaging: Xr Cervical Spine 2 Or 3 Views  Result Date: 02/01/2018 Multilevel spondylosis was noted.  Severe narrowing between C5-6 and C6-7 was noted.  Anterior spurring was noted.   Speciality Comments: PLQ eye exam: 09/08/2017 Normal    Procedures:  No procedures performed Allergies: Ciprofloxacin; Propofol; Terbinafine; and Prednisone   Assessment / Plan:     Visit Diagnoses: Rheumatoid arthritis involving multiple sites with positive rheumatoid factor (HCC): She has no active synovitis.  She is having pain in multiple joints which is most likely due to osteoarthritis.  She is been having increased pain in her C-spine.  X-ray of the C-spine was obtained today.  She has been taking Plaquenil 200 mg 1 tablet daily.  She has not missed any doses.  A refill of Plaquenil sent to the pharmacy.  High risk medication use - PLQ: CBC and CMP were performed on 01/20/18.  PLQ eye exam was performed 09/08/17.   Sicca syndrome, unspecified (HCC): She continues to have sicca symptoms.  She uses Restasis eyedrops on a daily basis which help with her eye dryness.  She has no parotid swelling or tenderness on exam.  Primary osteoarthritis of both hands: She has PIP and DIP synovial thickening consistent with osteoarthritis of bilateral hands.  Joint protection muscle strengthening were discussed.  We discussed the  importance of hand exercises.  Osteopenia of multiple sites: She takes vitamin D 1000 units daily.  Iliotibial band syndrome, unspecified laterality: She continues to have bilateral IT band syndrome.  She is tender in bilateral trochanteric bursa.  We discussed going to physical therapy but she is unsure if her insurance will cover physical therapy.  She is been trying to perform stretching exercises at home on her own but is been difficult due to having difficulty getting up and down from the floor.  Trochanteric bursitis of  both hips: She has tenderness of bilateral trochanteric bursa.  She can try to perform stretching exercises on a regular basis.  We discussed physical therapy but she declined at this time due to being unsure of her physical therapy will covered by insurance.   Neck pain -She has been having increased pain in her neck.  She has good range of motion on exam.  An x-ray of her C-spine was obtained today.  Plan: XR Cervical Spine 2 or 3 views   Other medical conditions are listed as follows:   History of hypothyroidism  History of gastroesophageal reflux (GERD)  History of scoliosis  History of hyperlipidemia  History of diverticulitis     Orders: Orders Placed This Encounter  Procedures  . XR Cervical Spine 2 or 3 views   Meds ordered this encounter  Medications  . DISCONTD: hydroxychloroquine (PLAQUENIL) 200 MG tablet    Sig: Take 1 tablet (200 mg total) by mouth daily.    Dispense:  90 tablet    Refill:  0  . hydroxychloroquine (PLAQUENIL) 200 MG tablet    Sig: Take 1 tablet (200 mg total) by mouth daily.    Dispense:  90 tablet    Refill:  0    Face-to-face time spent with patient was 30 minutes. >50% of time was spent in counseling and coordination of care.  Follow-Up Instructions: Return in about 5 months (around 07/04/2018) for Rheumatoid arthritis, Osteoarthritis.   Sherron Ales  PA-C  I examined and evaluated the patient with Sherron Ales PA.   Patient had limited range of motion of her C-spine on my examination today.  She is complaining of right-sided radiculopathy.  The x-ray of the cervical spine was reviewed with her.  She has significant narrowing between C5-6 and C6-7.  We will schedule MRI of her C-spine to evaluate this further.  The plan of care was discussed as noted above.  Pollyann Savoy, MD Note - This record has been created using Animal nutritionist.  Chart creation errors have been sought, but may not always  have been located. Such creation errors do not reflect on  the standard of medical care.

## 2018-01-21 LAB — CMP14+EGFR
A/G RATIO: 1.8 (ref 1.2–2.2)
ALT: 35 IU/L — AB (ref 0–32)
AST: 28 IU/L (ref 0–40)
Albumin: 4.6 g/dL (ref 3.5–4.8)
Alkaline Phosphatase: 113 IU/L (ref 39–117)
BUN/Creatinine Ratio: 25 (ref 12–28)
BUN: 22 mg/dL (ref 8–27)
CALCIUM: 9.4 mg/dL (ref 8.7–10.3)
CHLORIDE: 108 mmol/L — AB (ref 96–106)
CO2: 23 mmol/L (ref 20–29)
Creatinine, Ser: 0.89 mg/dL (ref 0.57–1.00)
GFR, EST AFRICAN AMERICAN: 75 mL/min/{1.73_m2} (ref >59)
GFR, EST NON AFRICAN AMERICAN: 65 mL/min/{1.73_m2} (ref >59)
GLOBULIN, TOTAL: 2.6 g/dL (ref 1.5–4.5)
Glucose: 76 mg/dL (ref 65–99)
POTASSIUM: 5 mmol/L (ref 3.5–5.2)
SODIUM: 145 mmol/L — AB (ref 134–144)
TOTAL PROTEIN: 7.2 g/dL (ref 6.0–8.5)

## 2018-01-21 LAB — CBC WITH DIFFERENTIAL/PLATELET
BASOS ABS: 0 10*3/uL (ref 0.0–0.2)
BASOS: 0 %
EOS (ABSOLUTE): 0.1 10*3/uL (ref 0.0–0.4)
Eos: 2 %
Hematocrit: 38.6 % (ref 34.0–46.6)
Hemoglobin: 13.2 g/dL (ref 11.1–15.9)
IMMATURE GRANS (ABS): 0 10*3/uL (ref 0.0–0.1)
Immature Granulocytes: 0 %
LYMPHS ABS: 1.1 10*3/uL (ref 0.7–3.1)
Lymphs: 28 %
MCH: 31.4 pg (ref 26.6–33.0)
MCHC: 34.2 g/dL (ref 31.5–35.7)
MCV: 92 fL (ref 79–97)
MONOS ABS: 0.3 10*3/uL (ref 0.1–0.9)
Monocytes: 9 %
NEUTROS ABS: 2.3 10*3/uL (ref 1.4–7.0)
Neutrophils: 61 %
PLATELETS: 233 10*3/uL (ref 150–450)
RBC: 4.21 x10E6/uL (ref 3.77–5.28)
RDW: 13.3 % (ref 12.3–15.4)
WBC: 3.8 10*3/uL (ref 3.4–10.8)

## 2018-02-01 ENCOUNTER — Encounter: Payer: Self-pay | Admitting: Physician Assistant

## 2018-02-01 ENCOUNTER — Ambulatory Visit: Payer: Medicare Other | Admitting: Physician Assistant

## 2018-02-01 ENCOUNTER — Ambulatory Visit (INDEPENDENT_AMBULATORY_CARE_PROVIDER_SITE_OTHER): Payer: Self-pay

## 2018-02-01 VITALS — BP 107/66 | HR 78 | Ht 61.5 in | Wt 147.0 lb

## 2018-02-01 DIAGNOSIS — Z8719 Personal history of other diseases of the digestive system: Secondary | ICD-10-CM | POA: Diagnosis not present

## 2018-02-01 DIAGNOSIS — M7062 Trochanteric bursitis, left hip: Secondary | ICD-10-CM

## 2018-02-01 DIAGNOSIS — M0579 Rheumatoid arthritis with rheumatoid factor of multiple sites without organ or systems involvement: Secondary | ICD-10-CM

## 2018-02-01 DIAGNOSIS — M19041 Primary osteoarthritis, right hand: Secondary | ICD-10-CM | POA: Diagnosis not present

## 2018-02-01 DIAGNOSIS — M7630 Iliotibial band syndrome, unspecified leg: Secondary | ICD-10-CM | POA: Diagnosis not present

## 2018-02-01 DIAGNOSIS — M7061 Trochanteric bursitis, right hip: Secondary | ICD-10-CM | POA: Diagnosis not present

## 2018-02-01 DIAGNOSIS — Z79899 Other long term (current) drug therapy: Secondary | ICD-10-CM | POA: Diagnosis not present

## 2018-02-01 DIAGNOSIS — M542 Cervicalgia: Secondary | ICD-10-CM

## 2018-02-01 DIAGNOSIS — Z8639 Personal history of other endocrine, nutritional and metabolic disease: Secondary | ICD-10-CM | POA: Diagnosis not present

## 2018-02-01 DIAGNOSIS — M19042 Primary osteoarthritis, left hand: Secondary | ICD-10-CM

## 2018-02-01 DIAGNOSIS — M3500 Sicca syndrome, unspecified: Secondary | ICD-10-CM

## 2018-02-01 DIAGNOSIS — Z8739 Personal history of other diseases of the musculoskeletal system and connective tissue: Secondary | ICD-10-CM | POA: Diagnosis not present

## 2018-02-01 DIAGNOSIS — M5030 Other cervical disc degeneration, unspecified cervical region: Secondary | ICD-10-CM | POA: Diagnosis not present

## 2018-02-01 DIAGNOSIS — M8589 Other specified disorders of bone density and structure, multiple sites: Secondary | ICD-10-CM | POA: Diagnosis not present

## 2018-02-01 MED ORDER — HYDROXYCHLOROQUINE SULFATE 200 MG PO TABS: 200.0000 mg | ORAL_TABLET | Freq: Every day | ORAL | 0 refills | Status: DC

## 2018-02-01 NOTE — Patient Instructions (Signed)
Neck Exercises Neck exercises can be important for many reasons:  They can help you to improve and maintain flexibility in your neck. This can be especially important as you age.  They can help to make your neck stronger. This can make movement easier.  They can reduce or prevent neck pain.  They may help your upper back.  Ask your health care provider which neck exercises would be best for you. Exercises Neck Press Repeat this exercise 10 times. Do it first thing in the morning and right before bed or as told by your health care provider. 1. Lie on your back on a firm bed or on the floor with a pillow under your head. 2. Use your neck muscles to push your head down on the pillow and straighten your spine. 3. Hold the position as well as you can. Keep your head facing up and your chin tucked. 4. Slowly count to 5 while holding this position. 5. Relax for a few seconds. Then repeat.  Isometric Strengthening Do a full set of these exercises 2 times a day or as told by your health care provider. 1. Sit in a supportive chair and place your hand on your forehead. 2. Push forward with your head and neck while pushing back with your hand. Hold for 10 seconds. 3. Relax. Then repeat the exercise 3 times. 4. Next, do thesequence again, this time putting your hand against the back of your head. Use your head and neck to push backward against the hand pressure. 5. Finally, do the same exercise on either side of your head, pushing sideways against the pressure of your hand.  Prone Head Lifts Repeat this exercise 5 times. Do this 2 times a day or as told by your health care provider. 1. Lie face-down, resting on your elbows so that your chest and upper back are raised. 2. Start with your head facing downward, near your chest. Position your chin either on or near your chest. 3. Slowly lift your head upward. Lift until you are looking straight ahead. Then continue lifting your head as far back as  you can stretch. 4. Hold your head up for 5 seconds. Then slowly lower it to your starting position.  Supine Head Lifts Repeat this exercise 8-10 times. Do this 2 times a day or as told by your health care provider. 1. Lie on your back, bending your knees to point to the ceiling and keeping your feet flat on the floor. 2. Lift your head slowly off the floor, raising your chin toward your chest. 3. Hold for 5 seconds. 4. Relax and repeat.  Scapular Retraction Repeat this exercise 5 times. Do this 2 times a day or as told by your health care provider. 1. Stand with your arms at your sides. Look straight ahead. 2. Slowly pull both shoulders backward and downward until you feel a stretch between your shoulder blades in your upper back. 3. Hold for 10-30 seconds. 4. Relax and repeat.  Contact a health care provider if:  Your neck pain or discomfort gets much worse when you do an exercise.  Your neck pain or discomfort does not improve within 2 hours after you exercise. If you have any of these problems, stop exercising right away. Do not do the exercises again unless your health care provider says that you can. Get help right away if:  You develop sudden, severe neck pain. If this happens, stop exercising right away. Do not do the exercises again unless your   health care provider says that you can. Exercises Neck Stretch  Repeat this exercise 3-5 times. 1. Do this exercise while standing or while sitting in a chair. 2. Place your feet flat on the floor, shoulder-width apart. 3. Slowly turn your head to the right. Turn it all the way to the right so you can look over your right shoulder. Do not tilt or tip your head. 4. Hold this position for 10-30 seconds. 5. Slowly turn your head to the left, to look over your left shoulder. 6. Hold this position for 10-30 seconds.  Neck Retraction Repeat this exercise 8-10 times. Do this 3-4 times a day or as told by your health care  provider. 1. Do this exercise while standing or while sitting in a sturdy chair. 2. Look straight ahead. Do not bend your neck. 3. Use your fingers to push your chin backward. Do not bend your neck for this movement. Continue to face straight ahead. If you are doing the exercise properly, you will feel a slight sensation in your throat and a stretch at the back of your neck. 4. Hold the stretch for 1-2 seconds. Relax and repeat.  This information is not intended to replace advice given to you by your health care provider. Make sure you discuss any questions you have with your health care provider. Document Released: 07/08/2015 Document Revised: 01/02/2016 Document Reviewed: 02/04/2015 Elsevier Interactive Patient Education  2018 [employer]  

## 2018-02-03 ENCOUNTER — Ambulatory Visit: Payer: Medicare Other

## 2018-02-04 ENCOUNTER — Telehealth (INDEPENDENT_AMBULATORY_CARE_PROVIDER_SITE_OTHER): Payer: Self-pay | Admitting: *Deleted

## 2018-02-04 ENCOUNTER — Ambulatory Visit
Admission: RE | Admit: 2018-02-04 | Discharge: 2018-02-04 | Disposition: A | Discharge status: Home / Self Care | Payer: Medicare Other | Source: Ambulatory Visit | Attending: Obstetrics & Gynecology | Admitting: Obstetrics & Gynecology

## 2018-02-04 DIAGNOSIS — Z1231 Encounter for screening mammogram for malignant neoplasm of breast: Secondary | ICD-10-CM

## 2018-02-04 NOTE — Telephone Encounter (Signed)
Pt called and left vm stating can leave message on her vm with appt date and time, I called back left vm of her appt and to also call office to schedule follow up.

## 2018-02-04 NOTE — Telephone Encounter (Signed)
Pt is scheduled at [facility] MRI on Wed July 3 at 4pm at [address], I called and left vm on home phone to return call for appt information.

## 2018-02-09 ENCOUNTER — Ambulatory Visit (HOSPITAL_COMMUNITY)
Admission: RE | Admit: 2018-02-09 | Discharge: 2018-02-09 | Disposition: A | Discharge status: Home / Self Care | Payer: Medicare Other | Source: Ambulatory Visit | Attending: Rheumatology | Admitting: Rheumatology

## 2018-02-09 DIAGNOSIS — M4802 Spinal stenosis, cervical region: Secondary | ICD-10-CM | POA: Diagnosis not present

## 2018-02-09 DIAGNOSIS — M5030 Other cervical disc degeneration, unspecified cervical region: Secondary | ICD-10-CM | POA: Diagnosis present

## 2018-02-09 DIAGNOSIS — M47812 Spondylosis without myelopathy or radiculopathy, cervical region: Secondary | ICD-10-CM | POA: Insufficient documentation

## 2018-02-11 ENCOUNTER — Telehealth: Payer: Self-pay | Admitting: Physician Assistant

## 2018-02-11 DIAGNOSIS — M542 Cervicalgia: Secondary | ICD-10-CM

## 2018-02-11 DIAGNOSIS — M4802 Spinal stenosis, cervical region: Secondary | ICD-10-CM

## 2018-02-11 NOTE — Telephone Encounter (Signed)
I called the patient to discuss her recent MRI results of her C-spine. 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 She has indentation of the ventral cord at this level.  All questions were addressed.  She would like a referral to Dr. Shelle Iron at [facility].  She has seen several orthopedists at this practice in the past for other concerns.  I will place this referral today.  I will also mail the MRI results to the patient.

## 2018-06-20 NOTE — Progress Notes (Signed)
Office Visit Note  Patient: Linda Berg             Date of Birth: 02-04-1946           MRN: [id number]             PCP: Richmond Campbell., PA-C Referring: Richmond Campbell., PA-C Visit Date: 07/04/2018 Occupation: @GUAROCC @  Subjective:  Neck pain    History of Present Illness: Linda Berg is a 72 y.o. female with history of seropositive rheumatoid arthritis, osteoarthritis, and sicca syndrome.  She is on plaquenil 200 mg 1 tablet by mouth daily.  She has not had any recent rheumatoid arthritis flares.  She reports that she has her Plaquenil eye exam scheduled for December 2019.  She denies having any hand pain or swelling at this time.  She states she occasionally has discomfort in her feet at night and uses Aspercreme.  She reports she continues to have neck pain but this radiculopathy has improved.  She states that she was evaluated by Dr. January 2020 who referred her first physical therapy for 8 weeks.  She states she continues to do exercises at home.  She states that last week she had right-sided sciatica which has resolved.   Activities of Daily Living:  Patient reports morning stiffness for 5 minutes.   Patient Reports nocturnal pain.  Difficulty dressing/grooming: Denies Difficulty climbing stairs: Denies Difficulty getting out of chair: Denies Difficulty using hands for taps, buttons, cutlery, and/or writing: Denies  Review of Systems  Constitutional: Positive for fatigue.  HENT: Positive for mouth dryness.   Eyes: Positive for dryness.  Respiratory: Negative for shortness of breath.   Cardiovascular: Positive for swelling in legs/feet.  Gastrointestinal: Positive for constipation.  Endocrine: Positive for increased urination.  Genitourinary: Negative for painful urination.  Musculoskeletal: Positive for arthralgias, joint pain, joint swelling and morning stiffness.  Skin: Negative for rash.  Neurological: Negative for numbness and weakness.  Hematological:  Positive for bruising/bleeding tendency.  Psychiatric/Behavioral: Positive for sleep disturbance.    PMFS History:  Patient Active Problem List   Diagnosis Date Noted  . Upper airway cough syndrome 10/13/2017  . Pneumomediastinum (HCC)   . Pneumoperitoneum   . Acute respiratory failure (HCC)   . Pneumothorax, traumatic   . Anaphylaxis 08/17/2017  . Trochanteric bursitis of both hips 03/16/2017  . Rheumatoid arthritis involving multiple sites with positive rheumatoid factor (HCC) 10/09/2016  . Sicca syndrome, unspecified (HCC) 10/09/2016  . Osteopenia of multiple sites 10/09/2016  . History of scoliosis 10/09/2016  . History of gastroesophageal reflux (GERD) 10/09/2016  . History of hypothyroidism 10/09/2016  . History of hyperlipidemia 10/09/2016  . High risk medication use 10/09/2016  . Primary osteoarthritis of both hands 10/09/2016  . Paresthesia 10/11/2014  . Hyperlipidemia 10/11/2014  . Small vessel disease, 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/11/2014  . Tinnitus 09/17/2014  . Abdominal pain 09/05/2014  . Acute cystitis 09/05/2014  . Arthritis 09/05/2014  . Cough 09/05/2014  . Esophagitis 09/05/2014  . Facial numbness 09/05/2014  . Difficulty hearing 09/05/2014  . H/O disease 09/05/2014  . HLD (hyperlipidemia) 09/05/2014  . Muscle ache 09/05/2014  . Symptoms involving urinary system 09/05/2014  . Awareness of heartbeats 09/05/2014  . Acne erythematosa 09/05/2014  . Absence of bladder continence 09/05/2014  . Urgency of micturation 09/05/2014  . Routine general medical examination at a health care facility 09/05/2014  . DYSPHAGIA 01/09/2009  . HYPOTHYROIDISM 11/04/2007  . DYSLIPIDEMIA 11/04/2007  . GERD 11/04/2007  . 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/04/2007  .  DUODENITIS, WITHOUT HEMORRHAGE 11/04/2007  . HIATAL HERNIA 11/04/2007  . DIVERTICULOSIS, COLON 11/04/2007  . CONSTIPATION, CHRONIC 11/04/2007  . RECTAL BLEEDING 11/04/2007    Past Medical History:  Diagnosis Date  . Barrett  esophagus   . Diverticulosis   . Dyslipidemia   . Facial numbness   . GERD (gastroesophageal reflux disease)   . Hearing loss   . Hiatal hernia   . Hypothyroidism   . Rheumatoid arteritis (HCC)     Family History  Problem Relation Age of Onset  . Heart disease Mother   . Stroke Father   . Heart disease Maternal Aunt        aunt x 2  . Irritable bowel syndrome Son   . GER disease Son   . Colon cancer Neg Hx    Past Surgical History:  Procedure Laterality Date  . CHEST TUBE INSERTION  08/17/2017  . COLONOSCOPY  08/17/2017  . INTRAUTERINE DEVICE INSERTION    . IUD REMOVAL    . KNEE ARTHROPLASTY    . KNEE ARTHROSCOPY  12/25/11   left  . LEFT OOPHORECTOMY  03/13/98  . MANDIBLE SURGERY  9/93   pallete expansion  . TONSILLECTOMY    . TUBAL LIGATION     Social History   Social History Narrative   Live with husband at home.   Left-handed.   1 cup coffee/day   Immunization History  Administered Date(s) Administered  . Influenza, High Dose Seasonal PF 04/29/2016, 05/03/2017  . Influenza-Unspecified 05/21/2010, 04/28/2013, 05/17/2014, 04/30/2015, 05/03/2017, 05/10/2018  . Pneumococcal Conjugate-13 03/29/2014  . Pneumococcal Polysaccharide-23 05/21/2010  . Zoster 10/19/2007  . Zoster Recombinat (Shingrix) 12/10/2017   Objective: Vital Signs: BP (!) 101/57 (BP Location: Left Arm, Patient Position: Sitting, Cuff Size: Normal)   Pulse 71   Resp 16   Ht 5' 1.5" (1.562 m)   Wt 150 lb 9.6 oz (68.3 kg)   BMI 27.99 kg/m    Physical Exam  Constitutional: She is oriented to person, place, and time. She appears well-developed and well-nourished.  HENT:  Head: Normocephalic and atraumatic.  Eyes: Conjunctivae and EOM are normal.  Neck: Normal range of motion.  Cardiovascular: Normal rate, regular rhythm, normal heart sounds and intact distal pulses.  Pulmonary/Chest: Effort normal and breath sounds normal.  Abdominal: Soft. Bowel sounds are normal.  Lymphadenopathy:    She  has no cervical adenopathy.  Neurological: She is alert and oriented to person, place, and time.  Skin: Skin is warm and dry. Capillary refill takes less than 2 seconds.  Psychiatric: She has a normal mood and affect. Her behavior is normal.  Nursing note and vitals reviewed.    Musculoskeletal Exam: C-spine limited range of motion without rotation especially to the right.  Thoracic and lumbar spine good range of motion.  No midline spinal tenderness.  No SI joint tenderness.  Shoulder joints, elbow joints, wrist joints, MCPs, PIPs, DIPs good range of motion no synovitis.  PIP and DIP synovial thickening consistent with osteoarthritis of bilateral hands.  Complete fist formation bilaterally.  Hip joints, knee joints, ankle joints, MTPs, PIPs, DIPs good range of motion no synovitis.  She has pedal edema bilaterally.  No warmth or effusion of bilateral knee joints.  She has bilateral knee crepitus.  She has tenderness of bilateral trochanter bursa.  She has PIP and DIP synovial thickening consistent with osteoarthritis of bilateral feet.  She has overcrowding of toes.   CDAI Exam: CDAI Score: 0.8  Patient Global Assessment: 5 (mm);  Provider Global Assessment: 3 (mm) Swollen: 0 ; Tender: 0  Joint Exam   Not documented   There is currently no information documented on the homunculus. Go to the Rheumatology activity and complete the homunculus joint exam.  Investigation: No additional findings.  Imaging: No results found.  Recent Labs: Lab Results  Component Value Date   WBC 6.0 06/30/2018   HGB 12.8 06/30/2018   PLT 287 06/30/2018   NA 141 06/30/2018   K 4.5 06/30/2018   CL 102 06/30/2018   CO2 21 06/30/2018   GLUCOSE 87 06/30/2018   BUN 16 06/30/2018   CREATININE 0.74 06/30/2018   BILITOT <0.2 06/30/2018   ALKPHOS 113 06/30/2018   AST 21 06/30/2018   ALT 36 (H) 06/30/2018   PROT 7.0 06/30/2018   ALBUMIN 4.2 06/30/2018   CALCIUM 9.5 06/30/2018   GFRAA 94 06/30/2018     Speciality Comments: PLQ eye exam: 09/08/2017 Normal  Procedures:  No procedures performed Allergies: Chlorpheniramine; Ciprofloxacin; Propofol; Terbinafine; and Prednisone   Assessment / Plan:     Visit Diagnoses: Rheumatoid arthritis involving multiple sites with positive rheumatoid factor (HCC): She has no active synovitis.  Her rheumatoid arthritis is well controlled on Plaquenil 2 mg 1 tablet by mouth daily.  She denied any recent rheumatoid arthritis flares.  She has occasional discomfort in bilateral feet especially at night.  She applies Aspercreme which resolves her symptoms.  She has osteoarthritic changes in bilateral feet.  She will continue on Plaquenil 200 mg 1 tablet by mouth daily.  A refill of Plaquenil be sent to the pharmacy today.  She is advised to notify us if she does increased joint pain or joint swelling.  She will follow-up in the office in 5 months.  High risk medication use -  Plaquenil 200 mg 1 tablet daily. PLQ eye exam: 09/08/2017 Normal.  CBC and CMP are stable on 06/30/2018.  She was given a PLQ eye exam form to take with her next appointment in December and every 3 months.   Sicca syndrome, unspecified (HCC): She continues to have sicca symptoms.  No parotid swelling.  She uses Restasis eyedrops as needed.  Primary osteoarthritis of both hands: PIP and DIP synovial thickening consistent with osteoarthritis of bilateral hands.  Complete fist formation bilaterally.  Joint protection and muscle strengthening were discussed.  Osteopenia of multiple sites: She takes vitamin D 1000 units by mouth daily.  Iliotibial band syndrome, unspecified laterality: Resolved.  Trochanteric bursitis of both hips: She has tenderness of bilateral trochanter bursa.  She perform stretching exercises on a regular basis.  Spinal stenosis of cervical region: She continues to have chronic neck pain and stiffness.  She has limited range of motion especially with lateral rotation to the  right.  She was evaluated by Dr. Jillyn Hidden.  She went to physical therapy for 8 weeks and continues to do exercises at home.  Her symptoms of right-sided radiculopathy have improved.   Other medical conditions are listed as follows:   History of hypothyroidism  History of gastroesophageal reflux (GERD)  History of scoliosis  History of hyperlipidemia  History of diverticulitis   Orders: No orders of the defined types were placed in this encounter.  Meds ordered this encounter  Medications  . hydroxychloroquine (PLAQUENIL) 200 MG tablet    Sig: Take 1 tablet (200 mg total) by mouth daily.    Dispense:  90 tablet    Refill:  0      Follow-Up Instructions: Return in about  5 months (around 12/03/2018) for Rheumatoid arthritis, Osteoarthritis.   Gearldine Bienenstock, PA-C  Note - This record has been created using Dragon software.  Chart creation errors have been sought, but may not always  have been located. Such creation errors do not reflect on  the standard of medical care.

## 2018-06-21 ENCOUNTER — Other Ambulatory Visit: Payer: Self-pay | Admitting: Physician Assistant

## 2018-06-21 NOTE — Telephone Encounter (Signed)
Last visit: 02/01/18 Next Visit: 07/04/18 Labs: 01/20/18 Na and chloride are stable. All other labs are WNL.  Okay to refill per Dr. Corliss Skains

## 2018-06-30 ENCOUNTER — Telehealth: Payer: Self-pay | Admitting: Rheumatology

## 2018-06-30 DIAGNOSIS — Z79899 Other long term (current) drug therapy: Secondary | ICD-10-CM

## 2018-06-30 NOTE — Telephone Encounter (Signed)
Patient called requesting her labwork orders be sent to [facility] on [address].  Patient states she plans to make an appointment to go today at 2:00 pm.

## 2018-06-30 NOTE — Telephone Encounter (Signed)
Lab orders have been released for [facility], patient has been notified.

## 2018-07-01 LAB — CMP14+EGFR
A/G RATIO: 1.5 (ref 1.2–2.2)
ALBUMIN: 4.2 g/dL (ref 3.5–4.8)
ALK PHOS: 113 IU/L (ref 39–117)
ALT: 36 IU/L — ABNORMAL HIGH (ref 0–32)
AST: 21 IU/L (ref 0–40)
BUN / CREAT RATIO: 22 (ref 12–28)
BUN: 16 mg/dL (ref 8–27)
Bilirubin Total: <0.2 mg/dL (ref 0.0–1.2)
CALCIUM: 9.5 mg/dL (ref 8.7–10.3)
CO2: 21 mmol/L (ref 20–29)
CREATININE: 0.74 mg/dL (ref 0.57–1.00)
Chloride: 102 mmol/L (ref 96–106)
GFR calc Af Amer: 94 mL/min/{1.73_m2} (ref >59)
GFR, EST NON AFRICAN AMERICAN: 81 mL/min/{1.73_m2} (ref >59)
GLOBULIN, TOTAL: 2.8 g/dL (ref 1.5–4.5)
Glucose: 87 mg/dL (ref 65–99)
Potassium: 4.5 mmol/L (ref 3.5–5.2)
SODIUM: 141 mmol/L (ref 134–144)
Total Protein: 7 g/dL (ref 6.0–8.5)

## 2018-07-01 LAB — CBC WITH DIFFERENTIAL/PLATELET
BASOS: 1 %
Basophils Absolute: 0.1 10*3/uL (ref 0.0–0.2)
EOS (ABSOLUTE): 0.2 10*3/uL (ref 0.0–0.4)
EOS: 3 %
HEMATOCRIT: 37.5 % (ref 34.0–46.6)
Hemoglobin: 12.8 g/dL (ref 11.1–15.9)
Immature Grans (Abs): 0 10*3/uL (ref 0.0–0.1)
Immature Granulocytes: 0 %
LYMPHS ABS: 1.3 10*3/uL (ref 0.7–3.1)
Lymphs: 22 %
MCH: 30.5 pg (ref 26.6–33.0)
MCHC: 34.1 g/dL (ref 31.5–35.7)
MCV: 89 fL (ref 79–97)
MONOS ABS: 0.5 10*3/uL (ref 0.1–0.9)
Monocytes: 9 %
NEUTROS ABS: 3.9 10*3/uL (ref 1.4–7.0)
Neutrophils: 65 %
Platelets: 287 10*3/uL (ref 150–450)
RBC: 4.2 x10E6/uL (ref 3.77–5.28)
RDW: 11.9 % — AB (ref 12.3–15.4)
WBC: 6 10*3/uL (ref 3.4–10.8)

## 2018-07-01 NOTE — Telephone Encounter (Signed)
stable °

## 2018-07-04 ENCOUNTER — Ambulatory Visit: Payer: Medicare Other | Admitting: Physician Assistant

## 2018-07-04 ENCOUNTER — Encounter: Payer: Self-pay | Admitting: Physician Assistant

## 2018-07-04 VITALS — BP 101/57 | HR 71 | Resp 16 | Ht 61.5 in | Wt 150.6 lb

## 2018-07-04 DIAGNOSIS — M0579 Rheumatoid arthritis with rheumatoid factor of multiple sites without organ or systems involvement: Secondary | ICD-10-CM | POA: Diagnosis not present

## 2018-07-04 DIAGNOSIS — Z79899 Other long term (current) drug therapy: Secondary | ICD-10-CM

## 2018-07-04 DIAGNOSIS — M19041 Primary osteoarthritis, right hand: Secondary | ICD-10-CM

## 2018-07-04 DIAGNOSIS — M4802 Spinal stenosis, cervical region: Secondary | ICD-10-CM

## 2018-07-04 DIAGNOSIS — M19042 Primary osteoarthritis, left hand: Secondary | ICD-10-CM

## 2018-07-04 DIAGNOSIS — M7630 Iliotibial band syndrome, unspecified leg: Secondary | ICD-10-CM

## 2018-07-04 DIAGNOSIS — M3500 Sicca syndrome, unspecified: Secondary | ICD-10-CM

## 2018-07-04 DIAGNOSIS — Z8739 Personal history of other diseases of the musculoskeletal system and connective tissue: Secondary | ICD-10-CM

## 2018-07-04 DIAGNOSIS — Z8639 Personal history of other endocrine, nutritional and metabolic disease: Secondary | ICD-10-CM

## 2018-07-04 DIAGNOSIS — M8589 Other specified disorders of bone density and structure, multiple sites: Secondary | ICD-10-CM

## 2018-07-04 DIAGNOSIS — Z8719 Personal history of other diseases of the digestive system: Secondary | ICD-10-CM

## 2018-07-04 DIAGNOSIS — M7062 Trochanteric bursitis, left hip: Secondary | ICD-10-CM

## 2018-07-04 DIAGNOSIS — M7061 Trochanteric bursitis, right hip: Secondary | ICD-10-CM

## 2018-07-04 MED ORDER — HYDROXYCHLOROQUINE SULFATE 200 MG PO TABS: 200.0000 mg | ORAL_TABLET | Freq: Every day | ORAL | 0 refills | Status: DC

## 2018-07-04 NOTE — Patient Instructions (Signed)
Standing Labs We placed an order today for your standing lab work.    Please come back and get your standing labs in February and every 3 months  We have open lab Monday through Friday from 8:30-11:30 AM and 1:30-4:00 PM  at the office of Dr. Shaili Deveshwar.   You may experience shorter wait times on Monday and Friday afternoons. The office is located at [address], [address], [place], [place] [address] No appointment is necessary.   Labs are drawn by [facility].  You may receive a bill from [facility] for your lab work. If you have any questions regarding directions or hours of operation,  please call [phone].   Just as a reminder please drink plenty of water prior to coming for your lab work. Thanks!  

## 2018-08-17 ENCOUNTER — Telehealth: Payer: Self-pay | Admitting: Neurology

## 2018-08-17 ENCOUNTER — Ambulatory Visit: Payer: Medicare Other | Admitting: Neurology

## 2018-08-17 ENCOUNTER — Encounter

## 2018-08-17 ENCOUNTER — Encounter: Payer: Self-pay | Admitting: Neurology

## 2018-08-17 VITALS — BP 125/73 | HR 75 | Ht 61.5 in | Wt 145.5 lb

## 2018-08-17 DIAGNOSIS — R479 Unspecified speech disturbances: Secondary | ICD-10-CM

## 2018-08-17 DIAGNOSIS — R519 Headache, unspecified: Secondary | ICD-10-CM | POA: Insufficient documentation

## 2018-08-17 DIAGNOSIS — F809 Developmental disorder of speech and language, unspecified: Secondary | ICD-10-CM | POA: Diagnosis not present

## 2018-08-17 DIAGNOSIS — R51 Headache: Secondary | ICD-10-CM

## 2018-08-17 NOTE — Telephone Encounter (Signed)
[employer] medicare order sent to [facility] for pt to be aware. I left [facility] phone number of [phone] and to give them a call if she has not heard from them in the next 2-3 business days.  °

## 2018-08-17 NOTE — Progress Notes (Signed)
PATIENT: Linda BromeMaria E Berg DOB: 1945-10-12  Chief Complaint  Patient presents with  . Neurological Concerns    On 06/06/18, she started having a right-sided headache, decreased comprehension, visual distortion, confusion and word finding difficulty.  These symptoms lasted less than five minutes.  She called her PCP who referred her here for further evaluation.  No other similar events have occurred.  Marland Kitchen. PCP    Richmond CampbellKaplan, Kristen W., PA-C     HISTORICAL  Linda Berg is a 73 year old female, seen in request by her primary care PA Mady GemmaKaplan, Kristen for evaluation of right-sided headaches, initial evaluation was on August 17, 2018,  I saw her previously in 2016 for similar complaints, she had a past medical history of hyperlipidemia, rheumatoid arthritis taking Plaquenil,  On December 22nd 2015, she woke up and noticed left facial numbness, left arm hand paresthesia, she denies dysarthria, no weakness, symptoms has gradually improved over the past few weeks, Last visit in March 2016, she was taking aspirin Asa 81mg  daily.  MRI brain Feb 9th 2016: There was periventricular small vessel disease, no acute lesions,   MRA of brain and neck was normal Myocardial perfusion study in September 2015, and EKG was normal  Lab in Nov 2019 normal CMP, CBC,  June 06, 2018, while she was reading, she had a sudden onset difficulty focusing, ringing of her right ear, severe headache on the left side, difficulty getting her words out, difficulty comprehend her reading material, she has to rest in her recliner, episode last about 5 minutes, headache lasted most of the day, blood pressure was within normal range 116/70 when that happened.  She did reported a history of migraine when she was younger, she describes her typical migraine as lateralized severe pounding headache with associated light and noise sensitivity, nauseous, she tends to sleep in dark quiet room for few hours,  I personally reviewed  MRI of cervical spine, multilevel degenerative changes, most severe at C5-6, C6-7, with evidence of mild canal stenosis, foraminal stenosis,  Laboratory evaluations on June 30, 2018 showed normal CMP CBC REVIEW OF SYSTEMS: Full 14 system review of systems performed and notable only for numbness, joint swelling, allergy, constipation, incontinence, hearing loss, ringing in ears All other review of systems were negative.  ALLERGIES: Allergies  Allergen Reactions  . Chlorpheniramine Hives and Shortness Of Breath  . Ciprofloxacin Anaphylaxis    Facial numbness  . Propofol Anaphylaxis  . Terbinafine Rash  . Prednisone Other (See Comments)    Stomach spasms    HOME MEDICATIONS: Current Outpatient Medications  Medication Sig Dispense Refill  . AMBULATORY NON FORMULARY MEDICATION ALLERGY SHOTS Once a week    . aspirin 81 MG tablet Take 81 mg by mouth 2 (two) times daily.    Marland Kitchen. azelastine (ASTELIN) 137 MCG/SPRAY nasal spray Place 1 spray into the nose as needed. Use in each nostril as directed     . B Complex-C (SUPER B COMPLEX PO) Take 1 tablet by mouth daily.    . Biotin 5000 MCG TABS Take 1 tablet by mouth 1 day or 1 dose.     . cetirizine (ZYRTEC) 10 MG tablet Take 10 mg by mouth at bedtime.    . cholecalciferol (VITAMIN D) 1000 units tablet Take 1,000 Units by mouth daily.    . Coenzyme Q10 (CO Q 10) 100 MG CAPS Take 1 capsule by mouth daily.     . cycloSPORINE (RESTASIS) 0.05 % ophthalmic emulsion Place 1 drop into both eyes  2 (two) times daily.     Marland Kitchen EPINEPHrine 0.3 mg/0.3 mL IJ SOAJ injection epinephrine 0.3 mg/0.3 mL injection, auto-injector    . esomeprazole (NEXIUM) 20 MG capsule Take 20 mg by mouth daily.     . famotidine (PEPCID) 20 MG tablet Take 20 mg by mouth daily.     . fluocinonide (LIDEX) 0.05 % external solution fluocinonide 0.05 % topical solution    . hydroxychloroquine (PLAQUENIL) 200 MG tablet Take 1 tablet (200 mg total) by mouth daily. 90 tablet 0  . Krill Oil  300 MG CAPS Take 1 capsule by mouth daily.    Marland Kitchen lovastatin (MEVACOR) 20 MG tablet Take 20 mg by mouth daily.    . Magnesium 250 MG TABS Take 1 tablet by mouth 2 (two) times daily.    . Probiotic Product (PROBIOTIC DAILY PO) Take 1 tablet by mouth daily.     Marland Kitchen thyroid (ARMOUR) 60 MG tablet Take 60 mg by mouth daily before breakfast.    . vitamin C (ASCORBIC ACID) 500 MG tablet Take 1,000 mg by mouth 2 (two) times daily.     . Zinc 50 MG CAPS Take by mouth.     No current facility-administered medications for this visit.     PAST MEDICAL HISTORY: Past Medical History:  Diagnosis Date  . Barrett esophagus   . Diverticulosis   . Dyslipidemia   . Facial numbness   . GERD (gastroesophageal reflux disease)   . Hearing loss   . Hiatal hernia   . Hypothyroidism   . Rheumatoid arteritis (HCC)     PAST SURGICAL HISTORY: Past Surgical History:  Procedure Laterality Date  . CHEST TUBE INSERTION  08/17/2017  . COLONOSCOPY  08/17/2017  . INTRAUTERINE DEVICE INSERTION    . IUD REMOVAL    . KNEE ARTHROPLASTY    . KNEE ARTHROSCOPY  12/25/11   left  . LEFT OOPHORECTOMY  03/13/98  . MANDIBLE SURGERY  9/93   pallete expansion  . TONSILLECTOMY    . TUBAL LIGATION      FAMILY HISTORY: Family History  Problem Relation Age of Onset  . Heart disease Mother   . Rheumatic fever Mother   . Heart failure Mother   . Stroke Father   . Diabetes Father   . Heart failure Father   . Heart disease Maternal Aunt        aunt x 2  . Irritable bowel syndrome Son   . GER disease Son   . Colon cancer Neg Hx     SOCIAL HISTORY: Social History   Socioeconomic History  . Marital status: Married    Spouse name: Not on file  . Number of children: 2  . Years of education: 62  . Highest education level: High school graduate  Occupational History  . Occupation: retired    Associate Professor: RETIRED  Social Needs  . Financial resource strain: Not on file  . Food insecurity:    Worry: Not on file     Inability: Not on file  . Transportation needs:    Medical: Not on file    Non-medical: Not on file  Tobacco Use  . Smoking status: Never Smoker  . Smokeless tobacco: Never Used  Substance and Sexual Activity  . Alcohol use: No    Alcohol/week: 0.0 standard drinks  . Drug use: No  . Sexual activity: Not on file  Lifestyle  . Physical activity:    Days per week: Not on file    Minutes  per session: Not on file  . Stress: Not on file  Relationships  . Social connections:    Talks on phone: Not on file    Gets together: Not on file    Attends religious service: Not on file    Active member of club or organization: Not on file    Attends meetings of clubs or organizations: Not on file    Relationship status: Not on file  . Intimate partner violence:    Fear of current or ex partner: Not on file    Emotionally abused: Not on file    Physically abused: Not on file    Forced sexual activity: Not on file  Other Topics Concern  . Not on file  Social History Narrative   Live with husband at home.   Left-handed.   1-2 cups per day.     PHYSICAL EXAM   Vitals:   08/17/18 0756  BP: 125/73  Pulse: 75  Weight: 145 lb 8 oz (66 kg)  Height: 5' 1.5" (1.562 m)    Not recorded      Body mass index is 27.05 kg/m.  PHYSICAL EXAMNIATION:  Gen: NAD, conversant, well nourised, obese, well groomed                     Cardiovascular: Regular rate rhythm, no peripheral edema, warm, nontender. Eyes: Conjunctivae clear without exudates or hemorrhage Neck: Supple, no carotid bruits. Pulmonary: Clear to auscultation bilaterally   NEUROLOGICAL EXAM:  MENTAL STATUS: Speech:    Speech is normal; fluent and spontaneous with normal comprehension.  Cognition:     Orientation to time, place and person     Normal recent and remote memory     Normal Attention span and concentration     Normal Language, naming, repeating,spontaneous speech     Fund of knowledge   CRANIAL NERVES: CN  II: Visual fields are full to confrontation. Fundoscopic exam is normal with sharp discs and no vascular changes. Pupils are round equal and briskly reactive to light. CN III, IV, VI: extraocular movement are normal. No ptosis. CN V: Facial sensation is intact to pinprick in all 3 divisions bilaterally. Corneal responses are intact.  CN VII: Face is symmetric with normal eye closure and smile. CN VIII: Hearing is normal to rubbing fingers CN IX, X: Palate elevates symmetrically. Phonation is normal. CN XI: Head turning and shoulder shrug are intact CN XII: Tongue is midline with normal movements and no atrophy.  MOTOR: There is no pronator drift of out-stretched arms. Muscle bulk and tone are normal. Muscle strength is normal.  REFLEXES: Reflexes are 2+ and symmetric at the biceps, triceps, knees, and ankles. Plantar responses are flexor.  SENSORY: Intact to light touch, pinprick, positional sensation and vibratory sensation are intact in fingers and toes.  COORDINATION: Rapid alternating movements and fine finger movements are intact. There is no dysmetria on finger-to-nose and heel-knee-shin.    GAIT/STANCE: Posture is normal. Gait is steady with normal steps, base, arm swing, and turning. Heel and toe walking are normal. Tandem gait is normal.  Romberg is absent.   DIAGNOSTIC DATA (LABS, IMAGING, TESTING) - I reviewed patient records, labs, notes, testing and imaging myself where available.   ASSESSMENT AND PLAN  Linda Berg is a 73 y.o. female   Acute onset of headache in October 2019 with language difficulty  Most consistent with migraine, she does have a history of migraine  Differentiation diagnoses also include TIA  Proceed with MRI of the brain  Ultrasound of carotid artery  Continue aspirin daily,  NSAIDs as needed   Levert Feinstein, M.D. Ph.D.  [facility] [address] [place], [place] [address] Ph: [phone] Fax:  [phone]  CC: Richmond Campbell., PA-C

## 2018-08-25 ENCOUNTER — Ambulatory Visit (HOSPITAL_COMMUNITY)
Admission: RE | Admit: 2018-08-25 | Discharge: 2018-08-25 | Disposition: A | Discharge status: Home / Self Care | Payer: Medicare Other | Source: Ambulatory Visit | Attending: Neurology | Admitting: Neurology

## 2018-08-25 DIAGNOSIS — R51 Headache: Secondary | ICD-10-CM

## 2018-08-25 DIAGNOSIS — F809 Developmental disorder of speech and language, unspecified: Secondary | ICD-10-CM | POA: Diagnosis present

## 2018-08-25 DIAGNOSIS — R479 Unspecified speech disturbances: Secondary | ICD-10-CM

## 2018-08-25 DIAGNOSIS — R519 Headache, unspecified: Secondary | ICD-10-CM

## 2018-08-27 ENCOUNTER — Ambulatory Visit
Admission: RE | Admit: 2018-08-27 | Discharge: 2018-08-27 | Disposition: A | Discharge status: Home / Self Care | Payer: Medicare Other | Source: Ambulatory Visit | Attending: Neurology | Admitting: Neurology

## 2018-08-27 DIAGNOSIS — R479 Unspecified speech disturbances: Secondary | ICD-10-CM

## 2018-08-29 ENCOUNTER — Telehealth: Payer: Self-pay | Admitting: Neurology

## 2018-08-29 NOTE — Telephone Encounter (Signed)
Left messages, on both home and mobile numbers, requesting a return call. 

## 2018-08-29 NOTE — Telephone Encounter (Signed)
I spoke to the patient.  She is aware of her MRI results and verbalized understanding.

## 2018-08-29 NOTE — Telephone Encounter (Signed)
Pt has called RN Michelle back, she is asking for a call back °

## 2018-08-29 NOTE — Telephone Encounter (Signed)
Please call patient, repeat MRI of the brain showed supratentorium small vessel disease, slight progression compared to previous scan in February 2016 no acute abnormality   IMPRESSION: Abnormal MRI scan of the brain showing nonspecific periventricular and subcortical T2/flair hyperintensities likely due to changes of chronic microvascular ischemia.  These appear to have progressed compared with previous MRI dated 09/18/2014.  No acute abnormalities are seen.

## 2018-09-21 ENCOUNTER — Other Ambulatory Visit: Payer: Self-pay | Admitting: Physician Assistant

## 2018-09-21 NOTE — Telephone Encounter (Signed)
Last Visit: 07/04/18 Next visit: 12/05/18 Labs: 06/30/18 stable PLQ eye exam: 08/05/18 WNL   Okay to refill per Dr. Corliss Skains

## 2018-11-28 NOTE — Progress Notes (Signed)
Virtual Visit via Video Note  I connected with Linda BromeMaria E Berg on 11/28/18 at  1:00 PM EDT by a video enabled telemedicine application and verified that I am speaking with the correct person using two identifiers.   I discussed the limitations of evaluation and management by telemedicine and the availability of in person appointments. The patient expressed understanding and agreed to proceed. This service was conducted via virtual visit.  Both audio and visual tools were used.  The patient was located at home. I was located in my office.  Consent was obtained prior to the virtual visit and is aware of possible charges through their insurance for this visit.  The patient is an established patient.  Dr. Corliss Skainseveshwar, MD conducted the virtual visit and Sherron Alesaylor Treyshon Buchanon, PA-C acted as scribed during the service.  Office staff helped with scheduling follow up visits after the service was conducted.    ZO:XWRUECC:Sicca symptoms   History of Present Illness: Patient is a 73 year old female with a past medical history of seropositive rheumatoid arthritis and osteoarthritis.  She is on PLQ 200 mg 1 tablet po daily.  She denies any recent rheumatoid arthritis flares. She experiences stiffness in both hands in the morning for about 5 minutes.  She denies any joint swelling.  she reports her neck pain and stiffness has improved significantly since completing PT.  She experienced lower back pain with right sided sciatica.  She had a cortisone injection performed by her orthopedist. She continues to have sicca symptoms.  She uses xylimelts and ACT lozenges for symptomatic relief.   Review of Systems  Constitutional: Positive for malaise/fatigue. Negative for fever.  HENT:       +Dry mouth  Eyes: Negative for photophobia, pain, discharge and redness.       +Dry eyes  Respiratory: Negative for cough, shortness of breath and wheezing.   Cardiovascular: Negative for chest pain and palpitations.  Gastrointestinal: Negative for  blood in stool, constipation and diarrhea.  Genitourinary: Negative for dysuria.  Musculoskeletal: Positive for back pain. Negative for joint pain, myalgias and neck pain.       +Morning stiffness   Skin: Negative for rash.  Neurological: Negative for dizziness and headaches.  Endo/Heme/Allergies: Positive for environmental allergies.  Psychiatric/Behavioral: Negative for depression. The patient has insomnia. The patient is not nervous/anxious.      Observations/Objective: Physical Exam  Constitutional: She is oriented to person, place, and time and well-developed, well-nourished, and in no distress.  HENT:  Head: Normocephalic and atraumatic.  Eyes: Conjunctivae are normal.  Pulmonary/Chest: Effort normal.  Neurological: She is alert and oriented to person, place, and time.  Psychiatric: Mood, memory, affect and judgment normal.   Patient reports morning stiffness for 5  minutes.   Patient denies nocturnal pain.  Difficulty dressing/grooming: Denies Difficulty climbing stairs: Denies Difficulty getting out of chair: Denies Difficulty using hands for taps, buttons, cutlery, and/or writing: Denies  Assessment and Plan: Rheumatoid arthritis involving multiple sites with positive rheumatoid factor (HCC): She has not had any recent rheumatoid arthritis flares.  She is clinically doing well on Plaquenil 200 mg 1 tablet by mouth daily. She has no joint pain or joint swelling at this time.  She experiences morning stiffness in both hands for about 5 minutes.  She will continue taking PLQ 200 mg 1 tablet po daily.  A refill was sent to the pharmacy.  She was advised to notify us if she develops increased joint pain or joint swelling.  She  will follow up in 3 months.   High risk medication use -  Plaquenil 200 mg 1 tablet by mouth daily.  PLQ eye exam: 08/05/18 WNL @ [facility] Follow up in 1 year CBC and CMP were drawn on 12/01/18.  She will be due to update lab work in September and  every 5 months.    Sicca syndrome, unspecified (HCC): She has chronic sicca symptoms.  She uses xylimelts and ACT lozenges PRN for symptomatic relief. She uses restasis eye drops PRN.  She will continue on PLQ 200 mg 1 tablet by mouth daily.   Primary osteoarthritis of both hands: She experiences stiffness in both hands.  She has no joint pain or joint swelling.  She has no difficulty with ADLs.  Osteopenia of multiple sites: She takes vitamin D 1000 units by mouth daily.  Iliotibial band syndrome, unspecified laterality: Resolved.  Trochanteric bursitis of both hips: She has no tenderness at this time.   Spinal stenosis of cervical region: Her pain and stiffness have improved. She was evaluated by an orthopedist in the past. She went to physical therapy for 8 weeks and continues to do exercises at home.   Follow Up Instructions: She will follow up in 3 months.  A 90-day supply of PLQ was sent to the pharmacy.      I discussed the assessment and treatment plan with the patient. The patient was provided an opportunity to ask questions and all were answered. The patient agreed with the plan and demonstrated an understanding of the instructions.   The patient was advised to call back or seek an in-person evaluation if the symptoms worsen or if the condition fails to improve as anticipated.  I provided 25 minutes of non-face-to-face time during this encounter.  Pollyann Savoy, MD   Scribed by-  Gearldine Bienenstock, PA-C

## 2018-11-29 ENCOUNTER — Other Ambulatory Visit: Payer: Self-pay

## 2018-11-29 ENCOUNTER — Telehealth: Payer: Self-pay | Admitting: Rheumatology

## 2018-11-29 DIAGNOSIS — Z79899 Other long term (current) drug therapy: Secondary | ICD-10-CM

## 2018-11-29 NOTE — Telephone Encounter (Signed)
Patient going to [facility] on [address] for labs. Please release orders.

## 2018-11-29 NOTE — Telephone Encounter (Signed)
Labs have been released for [facility].  

## 2018-12-02 LAB — CBC WITH DIFFERENTIAL/PLATELET
Basophils Absolute: 0 x10E3/uL (ref 0.0–0.2)
Basos: 1 %
EOS (ABSOLUTE): 0.1 x10E3/uL (ref 0.0–0.4)
Eos: 3 %
Hematocrit: 38.1 % (ref 34.0–46.6)
Hemoglobin: 12.6 g/dL (ref 11.1–15.9)
Immature Grans (Abs): 0 x10E3/uL (ref 0.0–0.1)
Immature Granulocytes: 0 %
Lymphocytes Absolute: 1.6 x10E3/uL (ref 0.7–3.1)
Lymphs: 36 %
MCH: 31 pg (ref 26.6–33.0)
MCHC: 33.1 g/dL (ref 31.5–35.7)
MCV: 94 fL (ref 79–97)
Monocytes Absolute: 0.5 x10E3/uL (ref 0.1–0.9)
Monocytes: 12 %
Neutrophils Absolute: 2.1 x10E3/uL (ref 1.4–7.0)
Neutrophils: 48 %
Platelets: 230 x10E3/uL (ref 150–450)
RBC: 4.06 x10E6/uL (ref 3.77–5.28)
RDW: 11.9 % (ref 11.7–15.4)
WBC: 4.4 x10E3/uL (ref 3.4–10.8)

## 2018-12-02 LAB — CMP14+EGFR
ALT: 19 IU/L (ref 0–32)
AST: 18 IU/L (ref 0–40)
Albumin/Globulin Ratio: 2.2 (ref 1.2–2.2)
Albumin: 4.7 g/dL (ref 3.7–4.7)
Alkaline Phosphatase: 89 IU/L (ref 39–117)
BUN/Creatinine Ratio: 31 — ABNORMAL HIGH (ref 12–28)
BUN: 22 mg/dL (ref 8–27)
Bilirubin Total: 0.2 mg/dL (ref 0.0–1.2)
CO2: 24 mmol/L (ref 20–29)
Calcium: 10.1 mg/dL (ref 8.7–10.3)
Chloride: 103 mmol/L (ref 96–106)
Creatinine, Ser: 0.72 mg/dL (ref 0.57–1.00)
GFR calc Af Amer: 97 mL/min/1.73
GFR calc non Af Amer: 84 mL/min/1.73
Globulin, Total: 2.1 g/dL (ref 1.5–4.5)
Glucose: 95 mg/dL (ref 65–99)
Potassium: 5 mmol/L (ref 3.5–5.2)
Sodium: 143 mmol/L (ref 134–144)
Total Protein: 6.8 g/dL (ref 6.0–8.5)

## 2018-12-05 ENCOUNTER — Ambulatory Visit: Payer: Medicare Other | Admitting: Physician Assistant

## 2018-12-05 ENCOUNTER — Encounter: Payer: Self-pay | Admitting: Rheumatology

## 2018-12-05 ENCOUNTER — Other Ambulatory Visit: Payer: Self-pay

## 2018-12-05 ENCOUNTER — Telehealth (INDEPENDENT_AMBULATORY_CARE_PROVIDER_SITE_OTHER): Payer: Medicare Other | Admitting: Rheumatology

## 2018-12-05 DIAGNOSIS — Z8639 Personal history of other endocrine, nutritional and metabolic disease: Secondary | ICD-10-CM

## 2018-12-05 DIAGNOSIS — M7061 Trochanteric bursitis, right hip: Secondary | ICD-10-CM

## 2018-12-05 DIAGNOSIS — M3500 Sicca syndrome, unspecified: Secondary | ICD-10-CM

## 2018-12-05 DIAGNOSIS — M19042 Primary osteoarthritis, left hand: Secondary | ICD-10-CM

## 2018-12-05 DIAGNOSIS — Z79899 Other long term (current) drug therapy: Secondary | ICD-10-CM

## 2018-12-05 DIAGNOSIS — Z8719 Personal history of other diseases of the digestive system: Secondary | ICD-10-CM

## 2018-12-05 DIAGNOSIS — Z8739 Personal history of other diseases of the musculoskeletal system and connective tissue: Secondary | ICD-10-CM

## 2018-12-05 DIAGNOSIS — M0579 Rheumatoid arthritis with rheumatoid factor of multiple sites without organ or systems involvement: Secondary | ICD-10-CM | POA: Diagnosis not present

## 2018-12-05 DIAGNOSIS — M4802 Spinal stenosis, cervical region: Secondary | ICD-10-CM

## 2018-12-05 DIAGNOSIS — M19041 Primary osteoarthritis, right hand: Secondary | ICD-10-CM

## 2018-12-05 DIAGNOSIS — M8589 Other specified disorders of bone density and structure, multiple sites: Secondary | ICD-10-CM

## 2018-12-05 DIAGNOSIS — M7062 Trochanteric bursitis, left hip: Secondary | ICD-10-CM

## 2018-12-05 MED ORDER — HYDROXYCHLOROQUINE SULFATE 200 MG PO TABS: 200.0000 mg | ORAL_TABLET | Freq: Every day | ORAL | 0 refills | Status: DC

## 2019-02-13 ENCOUNTER — Other Ambulatory Visit: Payer: Self-pay | Admitting: Obstetrics & Gynecology

## 2019-02-13 DIAGNOSIS — Z1231 Encounter for screening mammogram for malignant neoplasm of breast: Secondary | ICD-10-CM

## 2019-03-01 NOTE — Progress Notes (Signed)
Office Visit Note  Patient: Linda Berg             Date of Birth: 1945/10/04           MRN: [id number]             PCP: Richmond CampbellKaplan, Kristen W., PA-C Referring: Richmond CampbellKaplan, Kristen W., PA-C Visit Date: 03/14/2019 Occupation: @GUAROCC @  Subjective:  Hand stiffness    History of Present Illness: Linda BromeMaria E Boening is a 73 y.o. female with history of seropositive rheumatoid arthritis, sicca syndrome, and osteoarthritis.  She is on Plaquenil 200 mg 1 tablet po daily. She continues to have stiffness in both hands.  She denies any recent rheumatoid arthritis flares.  She has intermittent discomfort in her lower back.  She states that the discomfort is worse after performing housework activities.  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  She has occasional discomfort in her feet but denies any joint swelling.  She states that she uses BenGay topically at night if she is experiencing discomfort.  She reports that she tries to wear proper fitting shoes.  She denies any other joint pain or joint swelling at this time.  She does not need any refills of Plaquenil today.   Activities of Daily Living:  Patient reports morning stiffness for 5 minutes.   Patient Reports nocturnal pain.  Difficulty dressing/grooming: Denies Difficulty climbing stairs: Denies Difficulty getting out of chair: Denies Difficulty using hands for taps, buttons, cutlery, and/or writing: Denies  Review of Systems  Constitutional: Positive for fatigue.  HENT: Positive for mouth dryness. Negative for mouth sores and nose dryness.   Eyes: Positive for dryness. Negative for pain and visual disturbance.  Respiratory: Negative for cough, hemoptysis, shortness of breath and difficulty breathing.   Cardiovascular: Negative for chest pain, palpitations, hypertension and swelling in legs/feet.  Gastrointestinal: Positive for constipation. Negative for blood in stool and diarrhea.  Endocrine: Negative for increased  urination.  Genitourinary: Negative for painful urination.  Musculoskeletal: Positive for arthralgias, joint pain and morning stiffness. Negative for joint swelling, myalgias, muscle weakness, muscle tenderness and myalgias.  Skin: Negative for color change, pallor, rash, hair loss, nodules/bumps, skin tightness, ulcers and sensitivity to sunlight.  Allergic/Immunologic: Negative for susceptible to infections.  Neurological: Negative for dizziness, numbness, headaches and weakness.  Hematological: Negative for swollen glands.  Psychiatric/Behavioral: Positive for sleep disturbance. Negative for depressed mood. The patient is not nervous/anxious.     PMFS History:  Patient Active Problem List   Diagnosis Date Noted   Language difficulty 08/17/2018   Nonintractable headache 08/17/2018   Upper airway cough syndrome 10/13/2017   Pneumomediastinum (HCC)    Pneumoperitoneum    Acute respiratory failure (HCC)    Pneumothorax, traumatic    Anaphylaxis 08/17/2017   Trochanteric bursitis of both hips 03/16/2017   Rheumatoid arthritis involving multiple sites with positive rheumatoid factor (HCC) 10/09/2016   Sicca syndrome, unspecified (HCC) 10/09/2016   Osteopenia of multiple sites 10/09/2016   History of scoliosis 10/09/2016   History of gastroesophageal reflux (GERD) 10/09/2016   History of hypothyroidism 10/09/2016   History of hyperlipidemia 10/09/2016   High risk medication use 10/09/2016   Primary osteoarthritis of both hands 10/09/2016   Paresthesia 10/11/2014   Hyperlipidemia 10/11/2014   Small vessel disease, 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/11/2014   Tinnitus 09/17/2014   Abdominal pain 09/05/2014   Acute cystitis 09/05/2014   Arthritis 09/05/2014   Cough 09/05/2014   Esophagitis 09/05/2014   Facial numbness 09/05/2014  Difficulty hearing 09/05/2014   H/O disease 09/05/2014   HLD (hyperlipidemia) 09/05/2014   Muscle ache 09/05/2014   Symptoms  involving urinary system 09/05/2014   Awareness of heartbeats 09/05/2014   Acne erythematosa 09/05/2014   Absence of bladder continence 09/05/2014   Urgency of micturation 09/05/2014   Routine general medical examination at a health care facility 09/05/2014   DYSPHAGIA 01/09/2009   HYPOTHYROIDISM 11/04/2007   DYSLIPIDEMIA 11/04/2007   GERD 11/04/2007   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/04/2007   DUODENITIS, WITHOUT HEMORRHAGE 11/04/2007   HIATAL HERNIA 11/04/2007   DIVERTICULOSIS, COLON 11/04/2007   CONSTIPATION, CHRONIC 11/04/2007   RECTAL BLEEDING 11/04/2007    Past Medical History:  Diagnosis Date   Barrett esophagus    Diverticulosis    Dyslipidemia    Facial numbness    GERD (gastroesophageal reflux disease)    Hearing loss    Hiatal hernia    Hypothyroidism    Rheumatoid arteritis ([place])     Family History  Problem Relation Age of Onset   Heart disease Mother    Rheumatic fever Mother    Heart failure Mother    Stroke Father    Diabetes Father    Heart failure Father    Heart disease Maternal Aunt        aunt x 2   Irritable bowel syndrome Son    GER disease Son    Colon cancer Neg Hx    Past Surgical History:  Procedure Laterality Date   CHEST TUBE INSERTION  08/17/2017   COLONOSCOPY  08/17/2017   INTRAUTERINE DEVICE INSERTION     IUD REMOVAL     KNEE ARTHROPLASTY     KNEE ARTHROSCOPY  12/25/11   left   LEFT OOPHORECTOMY  03/13/98   MANDIBLE SURGERY  9/93   pallete expansion   TONSILLECTOMY     TUBAL LIGATION     Social History   Social History Narrative   Live with husband at home.   Left-handed.   1-2 cups per day.   Immunization History  Administered Date(s) Administered   Influenza, High Dose Seasonal PF 04/29/2016, 05/03/2017   Influenza-Unspecified 05/21/2010, 04/28/2013, 05/17/2014, 04/30/2015, 05/03/2017, 05/10/2018   Pneumococcal Conjugate-13 03/29/2014   Pneumococcal Polysaccharide-23 05/21/2010,  09/05/2018   Zoster 10/19/2007   Zoster Recombinat (Shingrix) 12/10/2017, 04/08/2018     Objective: Vital Signs: BP 114/69 (BP Location: Left Arm, Patient Position: Sitting, Cuff Size: Small)    Pulse 66    Resp 12    Ht 5' 1.5" (1.562 m)    Wt 126 lb 12.8 oz (57.5 kg)    BMI 23.57 kg/m    Physical Exam Vitals signs and nursing note reviewed.  Constitutional:      Appearance: She is well-developed.  HENT:     Head: Normocephalic and atraumatic.  Eyes:     Conjunctiva/sclera: Conjunctivae normal.  Neck:     Musculoskeletal: Normal range of motion.  Cardiovascular:     Rate and Rhythm: Normal rate and regular rhythm.     Heart sounds: Normal heart sounds.  Pulmonary:     Effort: Pulmonary effort is normal.     Breath sounds: Normal breath sounds.  Abdominal:     General: Bowel sounds are normal.     Palpations: Abdomen is soft.  Lymphadenopathy:     Cervical: No cervical adenopathy.  Skin:    General: Skin is warm and dry.     Capillary Refill: Capillary refill takes less than 2 seconds.  Neurological:  Mental Status: She is alert and oriented to person, place, and time.  Psychiatric:        Behavior: Behavior normal.      Musculoskeletal Exam:   CDAI Exam: CDAI Score: 1  Patient Global: 5 mm; Provider Global: 5 mm Swollen: 0 ; Tender: 0  Joint Exam   No joint exam has been documented for this visit   There is currently no information documented on the homunculus. Go to the Rheumatology activity and complete the homunculus joint exam.  Investigation: No additional findings.  Imaging: No results found.  Recent Labs: Lab Results  Component Value Date   WBC 5.1 03/13/2019   HGB 13.1 03/13/2019   PLT 231 03/13/2019   NA 143 03/13/2019   K 5.4 (H) 03/13/2019   CL 103 03/13/2019   CO2 25 03/13/2019   GLUCOSE 93 03/13/2019   BUN 21 03/13/2019   CREATININE 0.77 03/13/2019   BILITOT 0.2 03/13/2019   ALKPHOS 93 03/13/2019   AST 19 03/13/2019   ALT 17  03/13/2019   PROT 6.8 03/13/2019   ALBUMIN 4.6 03/13/2019   CALCIUM 9.7 03/13/2019   GFRAA 89 03/13/2019    Speciality Comments: PLQ eye exam: 08/05/18 WNL @ [facility] Follow up in 1 year  Procedures:  No procedures performed Allergies: Chlorpheniramine, Ciprofloxacin, Propofol, Terbinafine, and Prednisone   Assessment / Plan:     Visit Diagnoses: Rheumatoid arthritis involving multiple sites with positive rheumatoid factor (HCC) - She has no synovitis on exam.  She has not had any recent rheumatoid arthritis flares.  She is clinically doing well on Plaquenil 200 mg 1 tablet by mouth daily.  She experiences stiffness in bilateral hands but has no tenderness or synovitis on exam today.  She has PIP and DIP synovial thickening consistent with osteoarthritis of both hands.  She has intermittent discomfort in bilateral feet.  She will continue taking Plaquenil 200 mg 1 tablet by mouth daily.  She does not need any refills at this time.  She is advised to notify us develops increased joint pain or joint swelling.  She will follow-up in the office in 5 months.  High risk medication use -  Plaquenil 200 mg 1 tablet daily.  Last Plaquenil eye exam normal on 08/05/2018.  CBC and CMP were drawn on 03/13/2019.  Potassium was 5.4 but rest of lab work was within normal limits.  A specimen was most likely hemolyzed.  We will continue to monitor her lab work every 5 months.   Standing orders in place.  She received the flu vaccine in October and is up-to-date with pneumonia and shingles vaccines.    Sicca syndrome, unspecified (HCC) - She has chronic sicca symptoms.  She uses Restasis 1 drop twice daily in both eyes for symptomatic relief.  She continues to take Plaquenil 20 mg 1 tablet by mouth daily.  Primary osteoarthritis of both hands - She has PIP and DIP synovial thickening consistent with osteoarthritis of bilateral hands.  She has no tenderness or synovitis on exam.  She has complete fist  formation bilaterally.  We discussed the importance of joint protection and muscle strengthening.  She was given a handout of hand exercises to perform.  Osteopenia of multiple sites - She is taking a vitamin D supplement daily.  DEXA on 02/09/2017 1/3 Right distal radius BMD 0.560 with T score -2.2.  She is due to update her DEXA.  Trochanteric bursitis of both hips - She has intermittent trochanter bursitis bilaterally.  She has no tenderness on exam today.  She performs stretching exercises regularly.  Iliotibial band syndrome, unspecified laterality - Resolved.   Spinal stenosis of cervical region - She has limited ROM with discomfort.  She has no symptoms of radiculopathy at this time.   History of scoliosis-She has scoliosis on thoracolumbar spine.  No midline spinal tenderness at this time.  She has no symptoms of radiculopathy at this time.  She is intermittent lower back pain.  Her discomfort is worse after performing housework.  She experiences nocturnal pain in her lower back at times.  She has a salon pause patches as needed for pain relief.  She was given a handout of back exercises and core strengthening exercises to perform.  Other medical conditions are listed as follows:   History of hyperlipidemia   History of gastroesophageal reflux (GERD)  History of hypothyroidism   History of diverticulitis     Orders: No orders of the defined types were placed in this encounter.  No orders of the defined types were placed in this encounter.   Follow-Up Instructions: Return in about 5 months (around 08/14/2019) for Rheumatoid arthritis, Osteoarthritis.   Gearldine Bienenstock, PA-C  Note - This record has been created using Dragon software.  Chart creation errors have been sought, but may not always  have been located. Such creation errors do not reflect on  the standard of medical care.

## 2019-03-07 ENCOUNTER — Ambulatory Visit: Payer: Self-pay | Admitting: Physician Assistant

## 2019-03-13 ENCOUNTER — Other Ambulatory Visit: Payer: Self-pay

## 2019-03-13 ENCOUNTER — Telehealth: Payer: Self-pay | Admitting: Rheumatology

## 2019-03-13 DIAGNOSIS — Z79899 Other long term (current) drug therapy: Secondary | ICD-10-CM

## 2019-03-13 NOTE — Telephone Encounter (Signed)
Patient going to [facility] on [address] for labs today. Please release orders.

## 2019-03-13 NOTE — Telephone Encounter (Signed)
Lab orders have been released for [facility]. Patient has been notified.

## 2019-03-14 ENCOUNTER — Ambulatory Visit: Payer: Medicare Other | Admitting: Physician Assistant

## 2019-03-14 ENCOUNTER — Other Ambulatory Visit: Payer: Self-pay

## 2019-03-14 ENCOUNTER — Encounter: Payer: Self-pay | Admitting: Physician Assistant

## 2019-03-14 VITALS — BP 114/69 | HR 66 | Resp 12 | Ht 61.5 in | Wt 126.8 lb

## 2019-03-14 DIAGNOSIS — M19042 Primary osteoarthritis, left hand: Secondary | ICD-10-CM

## 2019-03-14 DIAGNOSIS — M19041 Primary osteoarthritis, right hand: Secondary | ICD-10-CM | POA: Diagnosis not present

## 2019-03-14 DIAGNOSIS — M7062 Trochanteric bursitis, left hip: Secondary | ICD-10-CM

## 2019-03-14 DIAGNOSIS — Z8719 Personal history of other diseases of the digestive system: Secondary | ICD-10-CM

## 2019-03-14 DIAGNOSIS — M7061 Trochanteric bursitis, right hip: Secondary | ICD-10-CM

## 2019-03-14 DIAGNOSIS — Z79899 Other long term (current) drug therapy: Secondary | ICD-10-CM

## 2019-03-14 DIAGNOSIS — M3500 Sicca syndrome, unspecified: Secondary | ICD-10-CM

## 2019-03-14 DIAGNOSIS — M4802 Spinal stenosis, cervical region: Secondary | ICD-10-CM

## 2019-03-14 DIAGNOSIS — Z8739 Personal history of other diseases of the musculoskeletal system and connective tissue: Secondary | ICD-10-CM

## 2019-03-14 DIAGNOSIS — M0579 Rheumatoid arthritis with rheumatoid factor of multiple sites without organ or systems involvement: Secondary | ICD-10-CM | POA: Diagnosis not present

## 2019-03-14 DIAGNOSIS — M7630 Iliotibial band syndrome, unspecified leg: Secondary | ICD-10-CM

## 2019-03-14 DIAGNOSIS — Z8639 Personal history of other endocrine, nutritional and metabolic disease: Secondary | ICD-10-CM

## 2019-03-14 DIAGNOSIS — M8589 Other specified disorders of bone density and structure, multiple sites: Secondary | ICD-10-CM

## 2019-03-14 LAB — CMP14+EGFR
ALT: 17 IU/L (ref 0–32)
AST: 19 IU/L (ref 0–40)
Albumin/Globulin Ratio: 2.1 (ref 1.2–2.2)
Albumin: 4.6 g/dL (ref 3.7–4.7)
Alkaline Phosphatase: 93 IU/L (ref 39–117)
BUN/Creatinine Ratio: 27 (ref 12–28)
BUN: 21 mg/dL (ref 8–27)
Bilirubin Total: 0.2 mg/dL (ref 0.0–1.2)
CO2: 25 mmol/L (ref 20–29)
Calcium: 9.7 mg/dL (ref 8.7–10.3)
Chloride: 103 mmol/L (ref 96–106)
Creatinine, Ser: 0.77 mg/dL (ref 0.57–1.00)
GFR calc Af Amer: 89 mL/min/{1.73_m2} (ref >59)
GFR calc non Af Amer: 77 mL/min/{1.73_m2} (ref >59)
Globulin, Total: 2.2 g/dL (ref 1.5–4.5)
Glucose: 93 mg/dL (ref 65–99)
Potassium: 5.4 mmol/L — ABNORMAL HIGH (ref 3.5–5.2)
Sodium: 143 mmol/L (ref 134–144)
Total Protein: 6.8 g/dL (ref 6.0–8.5)

## 2019-03-14 LAB — CBC WITH DIFFERENTIAL/PLATELET
Basophils Absolute: 0 10*3/uL (ref 0.0–0.2)
Basos: 1 %
EOS (ABSOLUTE): 0.1 10*3/uL (ref 0.0–0.4)
Eos: 3 %
Hematocrit: 38.6 % (ref 34.0–46.6)
Hemoglobin: 13.1 g/dL (ref 11.1–15.9)
Immature Grans (Abs): 0 10*3/uL (ref 0.0–0.1)
Immature Granulocytes: 0 %
Lymphocytes Absolute: 1.6 10*3/uL (ref 0.7–3.1)
Lymphs: 31 %
MCH: 31.6 pg (ref 26.6–33.0)
MCHC: 33.9 g/dL (ref 31.5–35.7)
MCV: 93 fL (ref 79–97)
Monocytes Absolute: 0.6 10*3/uL (ref 0.1–0.9)
Monocytes: 12 %
Neutrophils Absolute: 2.7 10*3/uL (ref 1.4–7.0)
Neutrophils: 53 %
Platelets: 231 10*3/uL (ref 150–450)
RBC: 4.15 x10E6/uL (ref 3.77–5.28)
RDW: 11.5 % — ABNORMAL LOW (ref 11.7–15.4)
WBC: 5.1 10*3/uL (ref 3.4–10.8)

## 2019-03-14 NOTE — Patient Instructions (Addendum)
Core Strength Exercises  Core exercises help to build strength in the muscles between your ribs and your hips (abdominal muscles). These muscles help to support your body and keep your spine stable. It is important to maintain strength in your core to prevent injury and pain. Some activities, such as yoga and Pilates, can help to strengthen core muscles. You can also strengthen core muscles with exercises at home. It is important to talk to your health care provider before you start a new exercise routine. What are the benefits of core strength exercises? Core strength exercises can:  Reduce back pain.  Help to rebuild strength after a back or spine injury.  Help to prevent injury during physical activity, especially injuries to the back and knees. How to do core strength exercises Repeat these exercises 10-15 times, or until you are tired. Do exercises exactly as told by your health care provider and adjust them as directed. It is normal to feel mild stretching, pulling, tightness, or discomfort as you do these exercises. If you feel any pain while doing these exercises, stop. If your pain continues or gets worse when doing core exercises, contact your health care provider. You may want to use a padded yoga or exercise mat for strength exercises that are done on the floor. Bridging  1. Lie on your back on a firm surface with your knees bent and your feet flat on the floor. 2. Raise your hips so that your knees, hips, and shoulders form a straight line together. Keep your abdominal muscles tight. 3. Hold this position for 3-5 seconds. 4. Slowly lower your hips to the starting position. 5. Let your muscles relax completely between repetitions. Single-leg bridge 1. Lie on your back on a firm surface with your knees bent and your feet flat on the floor. 2. Raise your hips so that your knees, hips, and shoulders form a straight line together. Keep your abdominal muscles tight. 3. Lift one foot  off the floor, then completely straighten that leg. 4. Hold this position for 3-5 seconds. 5. Put the straight leg back down in the bent position. 6. Slowly lower your hips to the starting position. 7. Repeat these steps using your other leg. Side bridge 1. Lie on your side with your knees bent. Prop yourself up on the elbow that is near the floor. 2. Using your abdominal muscles and your elbow that is on the floor, raise your body off the floor. Raise your hip so that your shoulder, hip, and foot form a straight line together. 3. Hold this position for 10 seconds. Keep your head and neck raised and away from your shoulder (in their normal, neutral position). Keep your abdominal muscles tight. 4. Slowly lower your hip to the starting position. 5. Repeat and try to hold this position longer, working your way up to 30 seconds. Abdominal crunch 1. Lie on your back on a firm surface. Bend your knees and keep your feet flat on the floor. 2. Cross your arms over your chest. 3. Without bending your neck, tip your chin slightly toward your chest. 4. Tighten your abdominal muscles as you lift your chest just high enough to lift your shoulder blades off of the floor. Do not hold your breath. You can do this with short lifts or long lifts. 5. Slowly return to the starting position. Bird dog 1. Get on your hands and knees, with your legs shoulder-width apart and your arms under your shoulders. Keep your back straight. 2. Tighten  your abdominal muscles. 3. Raise one of your legs off the floor and straighten it. Try to keep it parallel to the floor. 4. Slowly lower your leg to the starting position. 5. Raise one of your arms off the floor and straighten it. Try to keep it parallel to the floor. 6. Slowly lower your arm to the starting position. 7. Repeat with the other arm and leg. If possible, try raising a leg and arm at the same time, on opposite sides of the body. For example, raise your left hand and  your right leg. Plank 1. Lie on your belly. 2. Prop up your body onto your forearms and your feet, keeping your legs straight. Your body should make a straight line between your shoulders and feet. 3. Hold this position for 10 seconds while keeping your abdominal muscles tight. 4. Lower your body to the starting position. 5. Repeat and try to hold this position longer, working your way up to 30 seconds. Cross-core strengthening 1. Stand with your feet shoulder-width apart. 2. Hold a ball out in front of you. Keep your arms straight. 3. Tighten your abdominal muscles and slowly rotate at your waist from side to side. Keep your feet flat. 4. Once you are comfortable, try repeating this exercise with a heavier ball. Top core strengthening 1. Stand about 18 inches (46 cm) in front of a wall, with your back to the wall. 2. Keep your feet flat and shoulder-width apart. 3. Tighten your abdominal muscles. 4. Bend your hips and knees. 5. Slowly reach between your legs to touch the wall behind you. 6. Slowly stand back up. 7. Raise your arms over your head and reach behind you. 8. Return to the starting position. General tips  Do not do any exercises that cause pain. If you have pain while exercising, talk to your health care provider.  Always stretch before and after doing these exercises. This can help prevent injury.  Maintain a healthy weight. Ask your health care provider what weight is healthy for you. Contact a health care provider if:  You have back pain that gets worse or does not go away.  You feel pain while doing core strength exercises. Get help right away if:  You have severe pain that does not get better with medicine. Summary  Core exercises help to build strength in the muscles between your ribs and your waist.  Core muscles help to support your body and keep your spine stable.  Some activities, such as yoga and Pilates, can help to strengthen core muscles.  Core  strength exercises can help back pain and can prevent injury.  If you feel any pain while doing core strength exercises, stop. This information is not intended to replace advice given to you by your health care provider. Make sure you discuss any questions you have with your health care provider. Document Released: 12/16/2016 Document Revised: 11/16/2018 Document Reviewed: 12/16/2016 Elsevier Patient Education  2020 [employer]. Back Exercises These exercises help to make your trunk and back strong. They also help to keep the lower back flexible. Doing these exercises can help to prevent back pain or lessen existing pain.  If you have back pain, try to do these exercises 2-3 times each day or as told by your doctor.  As you get better, do the exercises once each day. Repeat the exercises more often as told by your doctor.  To stop back pain from coming back, do the exercises once each day, or as  told by your doctor. °Exercises °Single knee to chest °Do these steps 3-5 times in a row for each leg: °6. Lie on your back on a firm bed or the floor with your legs stretched out. °7. Bring one knee to your chest. °8. Grab your knee or thigh with both hands and hold them it in place. °9. Pull on your knee until you feel a gentle stretch in your lower back or buttocks. °10. Keep doing the stretch for 10-30 seconds. °11. Slowly let go of your leg and straighten it. °Pelvic tilt °Do these steps 5-10 times in a row: °8. Lie on your back on a firm bed or the floor with your legs stretched out. °9. Bend your knees so they point up to the ceiling. Your feet should be flat on the floor. °10. Tighten your lower belly (abdomen) muscles to press your lower back against the floor. This will make your tailbone point up to the ceiling instead of pointing down to your feet or the floor. °11. Stay in this position for 5-10 seconds while you gently tighten your muscles and breathe evenly. °Cat-cow °Do these steps until your  lower back bends more easily: °6. Get on your hands and knees on a firm surface. Keep your hands under your shoulders, and keep your knees under your hips. You may put padding under your knees. °7. Let your head hang down toward your chest. Tighten (contract) the muscles in your belly. Point your tailbone toward the floor so your lower back becomes rounded like the back of a cat. °8. Stay in this position for 5 seconds. °9. Slowly lift your head. Let the muscles of your belly relax. Point your tailbone up toward the ceiling so your back forms a sagging arch like the back of a cow. °10. Stay in this position for 5 seconds. ° °Press-ups °Do these steps 5-10 times in a row: °6. Lie on your belly (face-down) on the floor. °7. Place your hands near your head, about shoulder-width apart. °8. While you keep your back relaxed and keep your hips on the floor, slowly straighten your arms to raise the top half of your body and lift your shoulders. Do not use your back muscles. You may change where you place your hands in order to make yourself more comfortable. °9. Stay in this position for 5 seconds. °10. Slowly return to lying flat on the floor. ° °Bridges °Do these steps 10 times in a row: °8. Lie on your back on a firm surface. °9. Bend your knees so they point up to the ceiling. Your feet should be flat on the floor. Your arms should be flat at your sides, next to your body. °10. Tighten your butt muscles and lift your butt off the floor until your waist is almost as high as your knees. If you do not feel the muscles working in your butt and the back of your thighs, slide your feet 1-2 inches farther away from your butt. °11. Stay in this position for 3-5 seconds. °12. Slowly lower your butt to the floor, and let your butt muscles relax. °If this exercise is too easy, try doing it with your arms crossed over your chest. °Belly crunches °Do these steps 5-10 times in a row: °6. Lie on your back on a firm bed or the floor  with your legs stretched out. °7. Bend your knees so they point up to the ceiling. Your feet should be flat on the floor. °8. Cross   your arms over your chest. 9. Tip your chin a little bit toward your chest but do not bend your neck. 10. Tighten your belly muscles and slowly raise your chest just enough to lift your shoulder blades a tiny bit off of the floor. Avoid raising your body higher than that, because it can put too much stress on your low back. 11. Slowly lower your chest and your head to the floor. Back lifts Do these steps 5-10 times in a row: 5. Lie on your belly (face-down) with your arms at your sides, and rest your forehead on the floor. 6. Tighten the muscles in your legs and your butt. 7. Slowly lift your chest off of the floor while you keep your hips on the floor. Keep the back of your head in line with the curve in your back. Look at the floor while you do this. 8. Stay in this position for 3-5 seconds. 9. Slowly lower your chest and your face to the floor. Contact a doctor if:  Your back pain gets a lot worse when you do an exercise.  Your back pain does not get better 2 hours after you exercise. If you have any of these problems, stop doing the exercises. Do not do them again unless your doctor says it is okay. Get help right away if:  You have sudden, very bad back pain. If this happens, stop doing the exercises. Do not do them again unless your doctor says it is okay. This information is not intended to replace advice given to you by your health care provider. Make sure you discuss any questions you have with your health care provider. Document Released: 08/29/2010 Document Revised: 04/21/2018 Document Reviewed: 04/21/2018 Elsevier Patient Education  2020 [place].    Hand Exercises Hand exercises can be helpful for almost anyone. These exercises can strengthen the hands, improve flexibility and movement, and increase blood flow to the hands. These results can  make work and daily tasks easier. Hand exercises can be especially helpful for people who have joint pain from arthritis or have nerve damage from overuse (carpal tunnel syndrome). These exercises can also help people who have injured a hand. Exercises Most of these hand exercises are gentle stretching and motion exercises. It is usually safe to do them often throughout the day. Warming up your hands before exercise may help to reduce stiffness. You can do this with gentle massage or by placing your hands in warm water for 10-15 minutes. It is normal to feel some stretching, pulling, tightness, or mild discomfort as you begin new exercises. This will gradually improve. Stop an exercise right away if you feel sudden, severe pain or your pain gets worse. Ask your health care provider which exercises are best for you. Knuckle bend or "claw" fist 1. Stand or sit with your arm, hand, and all five fingers pointed straight up. Make sure to keep your wrist straight during the exercise. 2. Gently bend your fingers down toward your palm until the tips of your fingers are touching the top of your palm. Keep your big knuckle straight and just bend the small knuckles in your fingers. 3. Hold this position for __________ seconds. 4. Straighten (extend) your fingers back to the starting position. Repeat this exercise 5-10 times with each hand. Full finger fist 12. Stand or sit with your arm, hand, and all five fingers pointed straight up. Make sure to keep your wrist straight during the exercise. 13. Gently bend your fingers into  your palm until the tips of your fingers are touching the middle of your palm. 14. Hold this position for __________ seconds. 15. Extend your fingers back to the starting position, stretching every joint fully. Repeat this exercise 5-10 times with each hand. Straight fist 12. Stand or sit with your arm, hand, and all five fingers pointed straight up. Make sure to keep your wrist straight  during the exercise. 13. Gently bend your fingers at the big knuckle, where your fingers meet your hand, and the middle knuckle. Keep the knuckle at the tips of your fingers straight and try to touch the bottom of your palm. 14. Hold this position for __________ seconds. 15. Extend your fingers back to the starting position, stretching every joint fully. Repeat this exercise 5-10 times with each hand. Tabletop 11. Stand or sit with your arm, hand, and all five fingers pointed straight up. Make sure to keep your wrist straight during the exercise. 12. Gently bend your fingers at the big knuckle, where your fingers meet your hand, as far down as you can while keeping the small knuckles in your fingers straight. Think of forming a tabletop with your fingers. 13. Hold this position for __________ seconds. 14. Extend your fingers back to the starting position, stretching every joint fully. Repeat this exercise 5-10 times with each hand. Finger spread 11. Place your hand flat on a table with your palm facing down. Make sure your wrist stays straight as you do this exercise. 12. Spread your fingers and thumb apart from each other as far as you can until you feel a gentle stretch. Hold this position for __________ seconds. 13. Bring your fingers and thumb tight together again. Hold this position for __________ seconds. Repeat this exercise 5-10 times with each hand. Making circles 13. Stand or sit with your arm, hand, and all five fingers pointed straight up. Make sure to keep your wrist straight during the exercise. 14. Make a circle by touching the tip of your thumb to the tip of your index finger. 15. Hold for __________ seconds. Then open your hand wide. 16. Repeat this motion with your thumb and each finger on your hand. Repeat this exercise 5-10 times with each hand. Thumb motion 12. Sit with your forearm resting on a table and your wrist straight. Your thumb should be facing up toward the  ceiling. Keep your fingers relaxed as you move your thumb. 13. Lift your thumb up as high as you can toward the ceiling. Hold for __________ seconds. 14. Bend your thumb across your palm as far as you can, reaching the tip of your thumb for the small finger (pinkie) side of your palm. Hold for __________ seconds. Repeat this exercise 5-10 times with each hand. Grip strengthening  10. Hold a stress ball or other soft ball in the middle of your hand. 11. Slowly increase the pressure, squeezing the ball as much as you can without causing pain. Think of bringing the tips of your fingers into the middle of your palm. All of your finger joints should bend when doing this exercise. 12. Hold your squeeze for __________ seconds, then relax. Repeat this exercise 5-10 times with each hand. Contact a health care provider if:  Your hand pain or discomfort gets much worse when you do an exercise.  Your hand pain or discomfort does not improve within 2 hours after you exercise. If you have any of these problems, stop doing these exercises right away. Do not do them again  unless your health care provider says that you can. Get help right away if:  You develop sudden, severe hand pain or swelling. If this happens, stop doing these exercises right away. Do not do them again unless your health care provider says that you can. This information is not intended to replace advice given to you by your health care provider. Make sure you discuss any questions you have with your health care provider. Document Released: 07/08/2015 Document Revised: 11/17/2018 Document Reviewed: 07/28/2018 Elsevier Patient Education  2020 [employer].

## 2019-03-14 NOTE — Progress Notes (Signed)
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

## 2019-03-15 ENCOUNTER — Telehealth: Payer: Self-pay | Admitting: *Deleted

## 2019-03-15 DIAGNOSIS — M8589 Other specified disorders of bone density and structure, multiple sites: Secondary | ICD-10-CM

## 2019-03-15 NOTE — Telephone Encounter (Signed)
Attempted to contact the patient and left message for patient to call the office. Need to advise patient she is due to update DEXA. If patient is agreeable need to place order for DEXA.

## 2019-03-15 NOTE — Addendum Note (Signed)
Addended by: Carole Binning on: 03/15/2019 02:09 PM   Modules accepted: Orders

## 2019-03-15 NOTE — Telephone Encounter (Signed)
Patient advised she is due to update DEXA and is agreeable. Order placed.

## 2019-03-16 ENCOUNTER — Other Ambulatory Visit: Payer: Self-pay | Admitting: Physician Assistant

## 2019-03-16 DIAGNOSIS — M0579 Rheumatoid arthritis with rheumatoid factor of multiple sites without organ or systems involvement: Secondary | ICD-10-CM

## 2019-03-16 NOTE — Telephone Encounter (Signed)
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 Eye exam: 08/05/18 WNL  Okay to refill per Dr. Estanislado Pandy

## 2019-03-27 ENCOUNTER — Ambulatory Visit
Admission: RE | Admit: 2019-03-27 | Discharge: 2019-03-27 | Disposition: A | Discharge status: Home / Self Care | Payer: Medicare Other | Source: Ambulatory Visit | Attending: Obstetrics & Gynecology | Admitting: Obstetrics & Gynecology

## 2019-03-27 ENCOUNTER — Other Ambulatory Visit: Payer: Self-pay

## 2019-03-27 DIAGNOSIS — Z1231 Encounter for screening mammogram for malignant neoplasm of breast: Secondary | ICD-10-CM

## 2019-05-01 ENCOUNTER — Encounter: Payer: Self-pay | Admitting: Physician Assistant

## 2019-05-17 ENCOUNTER — Telehealth: Payer: Self-pay | Admitting: *Deleted

## 2019-05-17 NOTE — Telephone Encounter (Signed)
Received DEXA results from [facility].  Date of Scan: 05/01/19 Lowest T-score and site measured: 1/3 right distal radius -2.4 Significant changes in BMD and site measured (5% and above): -6% change in BMD in Left femur neck  Current Regimen:Vitamin D daily.   Recommendation: Come to office to discuss results and options.   Attempted to contact the patient and left message for patient to call the office.

## 2019-05-18 ENCOUNTER — Ambulatory Visit: Payer: Self-pay

## 2019-05-18 ENCOUNTER — Ambulatory Visit (INDEPENDENT_AMBULATORY_CARE_PROVIDER_SITE_OTHER): Payer: Medicare Other

## 2019-05-18 ENCOUNTER — Ambulatory Visit (INDEPENDENT_AMBULATORY_CARE_PROVIDER_SITE_OTHER): Payer: Medicare Other | Admitting: Physician Assistant

## 2019-05-18 ENCOUNTER — Encounter: Payer: Self-pay | Admitting: Physician Assistant

## 2019-05-18 ENCOUNTER — Other Ambulatory Visit: Payer: Self-pay

## 2019-05-18 VITALS — BP 98/67 | HR 67 | Resp 12 | Ht 60.5 in | Wt 121.2 lb

## 2019-05-18 DIAGNOSIS — M545 Low back pain, unspecified: Secondary | ICD-10-CM

## 2019-05-18 DIAGNOSIS — Z8739 Personal history of other diseases of the musculoskeletal system and connective tissue: Secondary | ICD-10-CM

## 2019-05-18 DIAGNOSIS — M19041 Primary osteoarthritis, right hand: Secondary | ICD-10-CM

## 2019-05-18 DIAGNOSIS — M7061 Trochanteric bursitis, right hip: Secondary | ICD-10-CM

## 2019-05-18 DIAGNOSIS — M4802 Spinal stenosis, cervical region: Secondary | ICD-10-CM

## 2019-05-18 DIAGNOSIS — Z79899 Other long term (current) drug therapy: Secondary | ICD-10-CM | POA: Diagnosis not present

## 2019-05-18 DIAGNOSIS — M0579 Rheumatoid arthritis with rheumatoid factor of multiple sites without organ or systems involvement: Secondary | ICD-10-CM

## 2019-05-18 DIAGNOSIS — M8589 Other specified disorders of bone density and structure, multiple sites: Secondary | ICD-10-CM

## 2019-05-18 DIAGNOSIS — M7630 Iliotibial band syndrome, unspecified leg: Secondary | ICD-10-CM

## 2019-05-18 DIAGNOSIS — M3500 Sicca syndrome, unspecified: Secondary | ICD-10-CM | POA: Diagnosis not present

## 2019-05-18 DIAGNOSIS — M7062 Trochanteric bursitis, left hip: Secondary | ICD-10-CM

## 2019-05-18 DIAGNOSIS — M19042 Primary osteoarthritis, left hand: Secondary | ICD-10-CM

## 2019-05-18 DIAGNOSIS — Z8639 Personal history of other endocrine, nutritional and metabolic disease: Secondary | ICD-10-CM

## 2019-05-18 DIAGNOSIS — Z8719 Personal history of other diseases of the digestive system: Secondary | ICD-10-CM

## 2019-05-18 NOTE — Progress Notes (Signed)
Office Visit Note  Patient: Linda Berg             Date of Birth: Feb 03, 1946           MRN: [id number]             PCP: Richmond Campbell., PA-C Referring: Richmond Campbell., PA-C Visit Date: 05/18/2019 Occupation: @GUAROCC @  Subjective:  Discussed DEXA results  History of Present Illness: Linda Berg is a 73 y.o. female with history of seropositive rheumatoid arthritis, osteoarthritis, and osteopenia.  She is taking Plaquenil 200 mg 1 tablet by mouth daily.  She denies any recent rheumatoid arthritis flares.  She denies any increased morning stiffness.  She denies any joint swelling at this time.  She continues to have intermittent discomfort in both hands and her lower back.  She denies any symptoms of radiculopathy at this time.  She states that overall her arthritis has been well controlled recently.  She states she has been trying to walk on a regular basis for exercise. She had a recent bone density which revealed that she is still in the osteopenia range, but se has had a decrease in the BMD.  She is taking a vitamin D supplement and obtains calcium in her diet.  She has noticed some height loss and weight loss.   Activities of Daily Living:  Patient reports morning stiffness for 5-10 minutes.   Patient Reports nocturnal pain.  Difficulty dressing/grooming: Denies Difficulty climbing stairs: Denies Difficulty getting out of chair: Denies Difficulty using hands for taps, buttons, cutlery, and/or writing: Reports  Review of Systems  Constitutional: Negative for fatigue.  HENT: Positive for mouth dryness and nose dryness. Negative for mouth sores.   Eyes: Positive for dryness. Negative for pain, itching and visual disturbance.  Respiratory: Negative for cough, hemoptysis, shortness of breath, wheezing and difficulty breathing.   Cardiovascular: Negative for chest pain, palpitations, hypertension and swelling in legs/feet.  Gastrointestinal: Positive for constipation.  Negative for blood in stool and diarrhea.  Endocrine: Negative for increased urination.  Genitourinary: Negative for difficulty urinating and painful urination.  Musculoskeletal: Positive for arthralgias, joint pain, joint swelling and morning stiffness. Negative for myalgias, muscle weakness, muscle tenderness and myalgias.  Skin: Positive for hair loss. Negative for color change, pallor, rash, nodules/bumps, skin tightness, ulcers and sensitivity to sunlight.  Allergic/Immunologic: Negative for susceptible to infections.  Neurological: Negative for dizziness, light-headedness, numbness, headaches, memory loss and weakness.  Hematological: Negative for swollen glands.  Psychiatric/Behavioral: Negative for depressed mood, confusion and sleep disturbance. The patient is not nervous/anxious.     PMFS History:  Patient Active Problem List   Diagnosis Date Noted  . Language difficulty 08/17/2018  . Nonintractable headache 08/17/2018  . Upper airway cough syndrome 10/13/2017  . Pneumomediastinum (HCC)   . Pneumoperitoneum   . Acute respiratory failure (HCC)   . Pneumothorax, traumatic   . Anaphylaxis 08/17/2017  . Trochanteric bursitis of both hips 03/16/2017  . Rheumatoid arthritis involving multiple sites with positive rheumatoid factor (HCC) 10/09/2016  . Sicca syndrome, unspecified (HCC) 10/09/2016  . Osteopenia of multiple sites 10/09/2016  . History of scoliosis 10/09/2016  . History of gastroesophageal reflux (GERD) 10/09/2016  . History of hypothyroidism 10/09/2016  . History of hyperlipidemia 10/09/2016  . High risk medication use 10/09/2016  . Primary osteoarthritis of both hands 10/09/2016  . Paresthesia 10/11/2014  . Hyperlipidemia 10/11/2014  . Small vessel disease, 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/11/2014  . Tinnitus 09/17/2014  . Abdominal pain 09/05/2014  .  Acute cystitis 09/05/2014  . Arthritis 09/05/2014  . Cough 09/05/2014  . Esophagitis 09/05/2014  . Facial numbness  09/05/2014  . Difficulty hearing 09/05/2014  . H/O disease 09/05/2014  . HLD (hyperlipidemia) 09/05/2014  . Muscle ache 09/05/2014  . Symptoms involving urinary system 09/05/2014  . Awareness of heartbeats 09/05/2014  . Acne erythematosa 09/05/2014  . Absence of bladder continence 09/05/2014  . Urgency of micturation 09/05/2014  . Routine general medical examination at a health care facility 09/05/2014  . DYSPHAGIA 01/09/2009  . HYPOTHYROIDISM 11/04/2007  . DYSLIPIDEMIA 11/04/2007  . GERD 11/04/2007  . 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/04/2007  . DUODENITIS, WITHOUT HEMORRHAGE 11/04/2007  . HIATAL HERNIA 11/04/2007  . DIVERTICULOSIS, COLON 11/04/2007  . CONSTIPATION, CHRONIC 11/04/2007  . RECTAL BLEEDING 11/04/2007    Past Medical History:  Diagnosis Date  . Barrett esophagus   . Diverticulosis   . Dyslipidemia   . Facial numbness   . GERD (gastroesophageal reflux disease)   . Hearing loss   . Hiatal hernia   . Hypothyroidism   . Rheumatoid arteritis (HCC)     Family History  Problem Relation Age of Onset  . Heart disease Mother   . Rheumatic fever Mother   . Heart failure Mother   . Stroke Father   . Diabetes Father   . Heart failure Father   . Heart disease Maternal Aunt        aunt x 2  . Irritable bowel syndrome Son   . GER disease Son   . Colon cancer Neg Hx    Past Surgical History:  Procedure Laterality Date  . CATARACT EXTRACTION, BILATERAL     04/13/2019, 04/27/2019  . CHEST TUBE INSERTION  08/17/2017  . COLONOSCOPY  08/17/2017  . INTRAUTERINE DEVICE INSERTION    . IUD REMOVAL    . KNEE ARTHROPLASTY    . KNEE ARTHROSCOPY  12/25/11   left  . LEFT OOPHORECTOMY  03/13/98  . MANDIBLE SURGERY  9/93   pallete expansion  . TONSILLECTOMY    . TUBAL LIGATION     Social History   Social History Narrative   Live with husband at home.   Left-handed.   1-2 cups per day.   Immunization History  Administered Date(s) Administered  . Influenza, High Dose Seasonal PF  04/29/2016, 05/03/2017  . Influenza-Unspecified 05/21/2010, 04/28/2013, 05/17/2014, 04/30/2015, 05/03/2017, 05/10/2018  . Pneumococcal Conjugate-13 03/29/2014  . Pneumococcal Polysaccharide-23 05/21/2010, 09/05/2018  . Zoster 10/19/2007  . Zoster Recombinat (Shingrix) 12/10/2017, 04/08/2018     Objective: Vital Signs: BP 98/67 (BP Location: Left Arm, Patient Position: Sitting, Cuff Size: Normal)   Pulse 67   Resp 12   Ht 5' 0.5" (1.537 m)   Wt 121 lb 3.2 oz (55 kg)   BMI 23.28 kg/m    Physical Exam Vitals signs and nursing note reviewed.  Constitutional:      Appearance: She is well-developed.  HENT:     Head: Normocephalic and atraumatic.  Eyes:     Conjunctiva/sclera: Conjunctivae normal.  Neck:     Musculoskeletal: Normal range of motion.  Cardiovascular:     Rate and Rhythm: Normal rate and regular rhythm.     Heart sounds: Normal heart sounds.  Pulmonary:     Effort: Pulmonary effort is normal.     Breath sounds: Normal breath sounds.  Abdominal:     General: Bowel sounds are normal.     Palpations: Abdomen is soft.  Lymphadenopathy:     Cervical: No cervical adenopathy.  Skin:    General: Skin is warm and dry.     Capillary Refill: Capillary refill takes less than 2 seconds.  Neurological:     Mental Status: She is alert and oriented to person, place, and time.  Psychiatric:        Behavior: Behavior normal.      Musculoskeletal Exam: C-spine good ROM.  Thoracic kyphosis noted.  Limited ROM of lumbar spine. No midline spinal tenderness.  No SI joint tenderness.  Shoulder joints, elbow joints, wrist joints, MCPs, PIPs, and DIPs good ROM with no synovitis.  Complete fist formation bilaterally.  She has PIP and DIP synovial thickening consistent with osteoarthritis of both hands hip joints, knee joints, ankle joints, MTPs, PIPs, and DIPs good ROM with no synovitis .  No warmth or effusion of knee joints.  No tenderness or swelling of ankle joints.    CDAI Exam:  CDAI Score: 0.4  Patient Global: 2 mm; Provider Global: 2 mm Swollen: 0 ; Tender: 0  Joint Exam   No joint exam has been documented for this visit   There is currently no information documented on the homunculus. Go to the Rheumatology activity and complete the homunculus joint exam.  Investigation: No additional findings.  Imaging: Xr Lumbar Spine 1 View  Result Date: 05/18/2019 Severe multilevel disc disease and facet joint arthropathy was noted.  Lumbar spine was not very well visualized.  No obvious vertebral compression fracture was noted. Impression: These findings are consistent with multilevel disc disease and facet joint arthropathy.  Xr Thoracic Spine 1 View  Result Date: 05/18/2019 No vertebral wedging or compression fracture was noted in the thoracic spine.  Significant kyphosis and mild disc disease was noted. Impression: No vertebral compression fractures were visualized.   Recent Labs: Lab Results  Component Value Date   WBC 5.1 03/13/2019   HGB 13.1 03/13/2019   PLT 231 03/13/2019   NA 143 03/13/2019   K 5.4 (H) 03/13/2019   CL 103 03/13/2019   CO2 25 03/13/2019   GLUCOSE 93 03/13/2019   BUN 21 03/13/2019   CREATININE 0.77 03/13/2019   BILITOT 0.2 03/13/2019   ALKPHOS 93 03/13/2019   AST 19 03/13/2019   ALT 17 03/13/2019   PROT 6.8 03/13/2019   ALBUMIN 4.6 03/13/2019   CALCIUM 9.7 03/13/2019   GFRAA 89 03/13/2019    Speciality Comments: PLQ eye exam: 08/05/18 WNL @ [facility] Follow up in 1 year  Procedures:  No procedures performed Allergies: Chlorpheniramine, Ciprofloxacin, Propofol, Terbinafine, and Prednisone   Assessment / Plan:     Visit Diagnoses: Rheumatoid arthritis involving multiple sites with positive rheumatoid factor (HCC): She has no synovitis on exam.  She has not had any recent rheumatoid arthritis flares.  She has intermittent pain in bilateral hands.  She has synovial thickening of PIP and DIP joints consistent with  osteoarthritis.  She is clinically doing well on Plaquenil 200 mg 1 tablet by mouth daily.  She has not missed any doses recently.  She will continue on this current treatment regimen.  She does not need a refill at this time.  She was advised to notify us if she develops increased joint pain or joint swelling.  She will follow-up in the office in January.  High risk medication use - Plaquenil 200 mg 1 tablet by mouth daily.  CBC and CMP were stable on 03/13/2019.  She will return for lab work in January and every 5 months to monitor for drug toxicity.  PLQ eye exam: 08/05/18 WNL @ [facility].   Sicca syndrome (HCC): She has chronic mouth dryness, dryness, nose dryness.  She uses over-the-counter products for symptomatic relief.  Primary osteoarthritis of both hands: She has PIP and DIP synovial thickening consistent with osteoarthritis of both hands.  She has no tenderness or synovitis.  She has complete fist formation bilaterally.  Joint protection and muscle strengthening were discussed.  Trochanteric bursitis of both hips: She has tenderness over the right trochanteric bursa.  No tenderness over the left trochanteric bursa today.  She was encouraged to continue to perform stretching exercises on a regular basis.  Iliotibial band syndrome, unspecified laterality: Resolved.   Spinal stenosis of cervical region: She has good lateral rotation.  Slightly limited flexion and extension.  She has no symptoms of radiculopathy.   Acute midline low back pain without sciatica - Plan: XR Lumbar Spine 1 View, XR Thoracic Spine 1 View  Osteopenia of multiple sites: DEXA 05/01/2019 T score -2.4 1/3 right distal radius.  Significant changes in BMD and site measured -6% in BMD of the left femur neck.  She has been taking a vitamin D supplement on a daily basis.  She has been obtaining calcium in her diet.  We discussed the importance of resistive exercises.  We obtained lateral views of the thoracic and  lumbar spine today to assess for vertebral fractures.  No obvious vertebral fractures were noted.  We will recheck bone density in 2 years.  Other medical conditions are listed as follows:   History of hyperlipidemia  History of gastroesophageal reflux (GERD)  History of hypothyroidism  History of diverticulitis  History of scoliosis    Orders: Orders Placed This Encounter  Procedures  . XR Lumbar Spine 1 View  . XR Thoracic Spine 1 View   No orders of the defined types were placed in this encounter.   Face-to-face time spent with patient was 30 minutes. Greater than 50% of time was spent in counseling and coordination of care.  Follow-Up Instructions: Return for Rheumatoid arthritis.   Gearldine Bienenstockaylor M Amiri Tritch, PA-C   I examined and evaluated the patient with Sherron Alesaylor Colbi Schiltz PA. The plan of care was discussed as noted above.  Pollyann SavoyShaili Deveshwar, MD  Note - This record has been created using Animal [employer]istDragon software.  Chart creation errors have been sought, but may not always  have been located. Such creation errors do not reflect on  the standard of medical care.

## 2019-05-18 NOTE — Patient Instructions (Addendum)
Standing Labs We placed an order today for your standing lab work.    Please come back and get your standing labs in January every 5 months   We have open lab daily Monday through Thursday from 8:30-12:30 PM and 1:30-4:30 PM and Friday from 8:30-12:30 PM and 1:30-4:00 PM at the office of Dr. Bo Merino.   You may experience shorter wait times on Monday and Friday afternoons. The office is located at [address] [place], [place], [place] [address] No appointment is necessary.   Labs are drawn by [facility].  You may receive a bill from [facility] for your lab work.  If you wish to have your labs drawn at another location, please call the office 24 hours in advance to send orders.  If you have any questions regarding directions or hours of operation,  please call [phone].   Just as a reminder please drink plenty of water prior to coming for your lab work. Thanks!   Back Exercises These exercises help to make your trunk and back strong. They also help to keep the lower back flexible. Doing these exercises can help to prevent back pain or lessen existing pain.  If you have back pain, try to do these exercises 2-3 times each day or as told by your doctor.  As you get better, do the exercises once each day. Repeat the exercises more often as told by your doctor.  To stop back pain from coming back, do the exercises once each day, or as told by your doctor. Exercises Single knee to chest Do these steps 3-5 times in a row for each leg: 1. Lie on your back on a firm bed or the floor with your legs stretched out. 2. Bring one knee to your chest. 3. Grab your knee or thigh with both hands and hold them it in place. 4. Pull on your knee until you feel a gentle stretch in your lower back or buttocks. 5. Keep doing the stretch for 10-30 seconds. 6. Slowly let go of your leg and straighten it. Pelvic tilt Do these steps 5-10 times in a row: 1. Lie on your back on a firm bed or the  floor with your legs stretched out. 2. Bend your knees so they point up to the ceiling. Your feet should be flat on the floor. 3. Tighten your lower belly (abdomen) muscles to press your lower back against the floor. This will make your tailbone point up to the ceiling instead of pointing down to your feet or the floor. 4. Stay in this position for 5-10 seconds while you gently tighten your muscles and breathe evenly. Cat-cow Do these steps until your lower back bends more easily: 1. Get on your hands and knees on a firm surface. Keep your hands under your shoulders, and keep your knees under your hips. You may put padding under your knees. 2. Let your head hang down toward your chest. Tighten (contract) the muscles in your belly. Point your tailbone toward the floor so your lower back becomes rounded like the back of a cat. 3. Stay in this position for 5 seconds. 4. Slowly lift your head. Let the muscles of your belly relax. Point your tailbone up toward the ceiling so your back forms a sagging arch like the back of a cow. 5. Stay in this position for 5 seconds.  Press-ups Do these steps 5-10 times in a row: 1. Lie on your belly (face-down) on the floor. 2. Place your hands  near your head, about shoulder-width apart. 3. While you keep your back relaxed and keep your hips on the floor, slowly straighten your arms to raise the top half of your body and lift your shoulders. Do not use your back muscles. You may change where you place your hands in order to make yourself more comfortable. 4. Stay in this position for 5 seconds. 5. Slowly return to lying flat on the floor.  Bridges Do these steps 10 times in a row: 1. Lie on your back on a firm surface. 2. Bend your knees so they point up to the ceiling. Your feet should be flat on the floor. Your arms should be flat at your sides, next to your body. 3. Tighten your butt muscles and lift your butt off the floor until your waist is almost as high  as your knees. If you do not feel the muscles working in your butt and the back of your thighs, slide your feet 1-2 inches farther away from your butt. 4. Stay in this position for 3-5 seconds. 5. Slowly lower your butt to the floor, and let your butt muscles relax. If this exercise is too easy, try doing it with your arms crossed over your chest. Belly crunches Do these steps 5-10 times in a row: 1. Lie on your back on a firm bed or the floor with your legs stretched out. 2. Bend your knees so they point up to the ceiling. Your feet should be flat on the floor. 3. Cross your arms over your chest. 4. Tip your chin a little bit toward your chest but do not bend your neck. 5. Tighten your belly muscles and slowly raise your chest just enough to lift your shoulder blades a tiny bit off of the floor. Avoid raising your body higher than that, because it can put too much stress on your low back. 6. Slowly lower your chest and your head to the floor. Back lifts Do these steps 5-10 times in a row: 1. Lie on your belly (face-down) with your arms at your sides, and rest your forehead on the floor. 2. Tighten the muscles in your legs and your butt. 3. Slowly lift your chest off of the floor while you keep your hips on the floor. Keep the back of your head in line with the curve in your back. Look at the floor while you do this. 4. Stay in this position for 3-5 seconds. 5. Slowly lower your chest and your face to the floor. Contact a doctor if:  Your back pain gets a lot worse when you do an exercise.  Your back pain does not get better 2 hours after you exercise. If you have any of these problems, stop doing the exercises. Do not do them again unless your doctor says it is okay. Get help right away if:  You have sudden, very bad back pain. If this happens, stop doing the exercises. Do not do them again unless your doctor says it is okay. This information is not intended to replace advice given to  you by your health care provider. Make sure you discuss any questions you have with your health care provider. Document Released: 08/29/2010 Document Revised: 04/21/2018 Document Reviewed: 04/21/2018 Elsevier Patient Education  2020 [employer].

## 2019-05-19 ENCOUNTER — Other Ambulatory Visit: Payer: Self-pay | Admitting: Rheumatology

## 2019-05-19 DIAGNOSIS — M0579 Rheumatoid arthritis with rheumatoid factor of multiple sites without organ or systems involvement: Secondary | ICD-10-CM

## 2019-07-14 ENCOUNTER — Other Ambulatory Visit: Payer: Self-pay | Admitting: Rheumatology

## 2019-07-14 DIAGNOSIS — M0579 Rheumatoid arthritis with rheumatoid factor of multiple sites without organ or systems involvement: Secondary | ICD-10-CM

## 2019-07-17 NOTE — Telephone Encounter (Signed)
Last Visit: 05/18/2019 Next Visit: 08/16/2019 Labs: 03/13/2019 Potassium is mildly elevated. Most likely hemolyzed. Eye exam: 08/05/2018   Okay to refill per Dr. Estanislado Pandy.

## 2019-07-19 ENCOUNTER — Other Ambulatory Visit: Payer: Self-pay

## 2019-07-19 ENCOUNTER — Ambulatory Visit: Payer: Medicare Other | Admitting: Podiatry

## 2019-07-19 ENCOUNTER — Ambulatory Visit (INDEPENDENT_AMBULATORY_CARE_PROVIDER_SITE_OTHER): Payer: Medicare Other

## 2019-07-19 DIAGNOSIS — M21612 Bunion of left foot: Secondary | ICD-10-CM

## 2019-07-19 DIAGNOSIS — M2041 Other hammer toe(s) (acquired), right foot: Secondary | ICD-10-CM | POA: Diagnosis not present

## 2019-07-19 DIAGNOSIS — M2042 Other hammer toe(s) (acquired), left foot: Secondary | ICD-10-CM

## 2019-07-19 NOTE — Patient Instructions (Signed)
Pre-Operative Instructions  Congratulations, you have decided to take an important step towards improving your quality of life.  You can be assured that the doctors and staff at [facility] will be with you every step of the way.  Here are some important things you should know:  1. Plan to be at the surgery center/hospital at least 1 (one) hour prior to your scheduled time, unless otherwise directed by the surgical center/hospital staff.  You must have a responsible adult accompany you, remain during the surgery and drive you home.  Make sure you have directions to the surgical center/hospital to ensure you arrive on time. 2. If you are having surgery at [facility] or [facility] hospitals, you will need a copy of your medical history and physical form from your family physician within one month prior to the date of surgery. We will give you a form for your primary physician to complete.  3. We make every effort to accommodate the date you request for surgery.  However, there are times where surgery dates or times have to be moved.  We will contact you as soon as possible if a change in schedule is required.   4. No aspirin/ibuprofen for one week before surgery.  If you are on aspirin, any non-steroidal anti-inflammatory medications (Mobic, Aleve, Ibuprofen) should not be taken seven (7) days prior to your surgery.  You make take Tylenol for pain prior to surgery.  5. Medications - If you are taking daily heart and blood pressure medications, seizure, reflux, allergy, asthma, anxiety, pain or diabetes medications, make sure you notify the surgery center/hospital before the day of surgery so they can tell you which medications you should take or avoid the day of surgery. 6. No food or drink after midnight the night before surgery unless directed otherwise by surgical center/hospital staff. 7. No alcoholic beverages 24-hours prior to surgery.  No smoking 24-hours prior or 24-hours after  surgery. 8. Wear loose pants or shorts. They should be loose enough to fit over bandages, boots, and casts. 9. Don't wear slip-on shoes. Sneakers are preferred. 10. Bring your boot with you to the surgery center/hospital.  Also bring crutches or a walker if your physician has prescribed it for you.  If you do not have this equipment, it will be provided for you after surgery. 11. If you have not been contacted by the surgery center/hospital by the day before your surgery, call to confirm the date and time of your surgery. 12. Leave-time from work may vary depending on the type of surgery you have.  Appropriate arrangements should be made prior to surgery with your employer. 13. Prescriptions will be provided immediately following surgery by your doctor.  Fill these as soon as possible after surgery and take the medication as directed. Pain medications will not be refilled on weekends and must be approved by the doctor. 14. Remove nail polish on the operative foot and avoid getting pedicures prior to surgery. 15. Wash the night before surgery.  The night before surgery wash the foot and leg well with water and the antibacterial soap provided. Be sure to pay special attention to beneath the toenails and in between the toes.  Wash for at least three (3) minutes. Rinse thoroughly with water and dry well with a towel.  Perform this wash unless told not to do so by your physician.  Enclosed: 1 Ice pack (please put in freezer the night before surgery)   1 Hibiclens skin cleaner     Pre-op instructions  If you have any questions regarding the instructions, please do not hesitate to call our office.  [place]: [address], [place], [place] [address] -- [phone]  [place]: [address] [place], [place] [address] -- [phone]  [place]: [address]  [place], [place] [address] -- [phone]   Website: [email or web address] 

## 2019-07-23 NOTE — Progress Notes (Signed)
   Subjective: 73 y.o. female presenting today as a new patient with a chief complaint of a hammertoe of the 2nd digit of the left foot as well as a painful bunion deformity, both having been present for the past several years. She describes the pain as aching and throbbing. She has been using cushion wraps for treatment. Walking and being on the foot increases the pain. Patient is here for further evaluation and treatment.   Past Medical History:  Diagnosis Date  . Barrett esophagus   . Diverticulosis   . Dyslipidemia   . Facial numbness   . GERD (gastroesophageal reflux disease)   . Hearing loss   . Hiatal hernia   . Hypothyroidism   . Rheumatoid arteritis (HCC)       Objective: Physical Exam General: The patient is alert and oriented x3 in no acute distress.  Dermatology: Skin is cool, dry and supple bilateral lower extremities. Negative for open lesions or macerations.  Vascular: Palpable pedal pulses bilaterally. No edema or erythema noted. Capillary refill within normal limits.  Neurological: Epicritic and protective threshold grossly intact bilaterally.   Musculoskeletal Exam: Clinical evidence of bunion deformity noted to the respective foot. There is moderate pain on palpation range of motion of the first MPJ. Lateral deviation of the hallux noted consistent with hallux abductovalgus. Hammertoe contracture also noted on clinical exam to the 2nd digit of the left foot. Symptomatic pain on palpation and range of motion also noted to the metatarsal phalangeal joints of the respective hammertoe digits.    Radiographic Exam: Increased intermetatarsal angle greater than 15 with a hallux abductus angle greater than 30 noted on AP view. Moderate degenerative changes noted within the first MPJ. Contracture deformity also noted to the interphalangeal joints and MPJs of the digits of the respective hammertoes.    Assessment: 1. HAV w/ bunion deformity left foot 2. 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 Patient was evaluated. X-Rays reviewed. 2. Today we discussed the conservative versus surgical management of the presenting pathology. The patient opts for surgical management. All possible complications and details of the procedure were explained. All patient questions were answered. No guarantees were expressed or implied. 3. Authorization for surgery was initiated today. Surgery will consist of DIPJ arthroplasty left 2nd toe; Akin osteotomy left great toe.  4. Return to clinic one week post op.    Edrick Kins, DPM [facility]  Dr. Edrick Kins, [address]                                        [place], [place] [address]                Office [phone]  Fax [phone]

## 2019-08-10 ENCOUNTER — Telehealth: Payer: Self-pay | Admitting: Rheumatology

## 2019-08-10 DIAGNOSIS — M0579 Rheumatoid arthritis with rheumatoid factor of multiple sites without organ or systems involvement: Secondary | ICD-10-CM

## 2019-08-10 MED ORDER — HYDROXYCHLOROQUINE SULFATE 200 MG PO TABS: 200.0000 mg | ORAL_TABLET | Freq: Every day | ORAL | 0 refills | Status: DC

## 2019-08-10 NOTE — Telephone Encounter (Signed)
Last Visit: 05/18/2019 Next Visit: 10/11/2019 Labs: 03/13/2019 Potassium is mildly elevated. Most likely hemolyzed. Eye exam: 07/31/2019  Okay to refill per Dr. Estanislado Pandy.   Advised patient that a 2 week supply has been sent to her local pharmacy, patient verbalized understanding.

## 2019-08-10 NOTE — Telephone Encounter (Signed)
Patient called stating she received a call from [facility] who told her the prescription of Plaquenil is stuck at the [facility] in [place] and they are not sure when they will be delivered.  Patient states [facility] told her to contact Dr. Estanislado Pandy and request an emergency refill to be sent to her local pharmacy [facility] at [address] [place] in [place].

## 2019-08-16 ENCOUNTER — Ambulatory Visit: Payer: Medicare Other | Admitting: Rheumatology

## 2019-08-24 ENCOUNTER — Telehealth: Payer: Self-pay | Admitting: *Deleted

## 2019-08-24 NOTE — Telephone Encounter (Addendum)
"  I saw Dr. Logan Bores several weeks ago, in December sometime.  He was supposed to set up foot surgery for me in February.  I haven't heard anything from him or the facility if that surgery is going to be set up or not or if insurance approved it.  Give me a call."  I'm returning your call.  We have you scheduled for surgery on September 21, 2019.  "I know.  I wasn't given a call to let me know that the date was definite or not.  I was told someone would give me a call."  You are scheduled for that date.  Someone from the surgical center will give you a call a day or two prior to your surgery date and will give you your arrival time.  "Okay, I was just wondering.  I'll get a call from someone at the surgical center a couple of days before my surgery date."  Yes, that is correct.

## 2019-09-01 ENCOUNTER — Telehealth: Payer: Self-pay | Admitting: Rheumatology

## 2019-09-01 ENCOUNTER — Other Ambulatory Visit: Payer: Self-pay | Admitting: *Deleted

## 2019-09-01 DIAGNOSIS — Z79899 Other long term (current) drug therapy: Secondary | ICD-10-CM

## 2019-09-01 NOTE — Telephone Encounter (Signed)
Patient going to [facility] on Monday for draw. Please release orders.

## 2019-09-01 NOTE — Telephone Encounter (Signed)
Lab Orders released.  

## 2019-09-02 ENCOUNTER — Ambulatory Visit: Payer: Medicare Other | Attending: Internal Medicine

## 2019-09-02 DIAGNOSIS — Z23 Encounter for immunization: Secondary | ICD-10-CM | POA: Insufficient documentation

## 2019-09-02 NOTE — Progress Notes (Signed)
   Covid-19 Vaccination Clinic  Name:  KATALEIA QUARANTA    MRN: [id number] DOB: [birth year]  09/02/2019  Ms. Mccowan was observed post Covid-19 immunization for 15 minutes without incidence. She was provided with Vaccine Information Sheet and instruction to access the V-Safe system.   Ms. Behlke was instructed to call 911 with any severe reactions post vaccine: Marland Kitchen Difficulty breathing  . Swelling of your face and throat  . A fast heartbeat  . A bad rash all over your body  . Dizziness and weakness    Immunizations Administered    Name Date Dose VIS Date Route   Pfizer COVID-19 Vaccine 09/02/2019 12:46 PM 0.3 mL 07/21/2019 Intramuscular   Manufacturer: [employer], [employer]   Lot: [id number]   NDC: [address]-1

## 2019-09-05 LAB — CMP14+EGFR
ALT: 17 IU/L (ref 0–32)
AST: 22 IU/L (ref 0–40)
Albumin/Globulin Ratio: 2 (ref 1.2–2.2)
Albumin: 4.6 g/dL (ref 3.7–4.7)
Alkaline Phosphatase: 94 IU/L (ref 39–117)
BUN/Creatinine Ratio: 29 — ABNORMAL HIGH (ref 12–28)
BUN: 20 mg/dL (ref 8–27)
Bilirubin Total: <0.2 mg/dL (ref 0.0–1.2)
CO2: 25 mmol/L (ref 20–29)
Calcium: 9.7 mg/dL (ref 8.7–10.3)
Chloride: 101 mmol/L (ref 96–106)
Creatinine, Ser: 0.7 mg/dL (ref 0.57–1.00)
GFR calc Af Amer: 99 mL/min/{1.73_m2} (ref >59)
GFR calc non Af Amer: 86 mL/min/{1.73_m2} (ref >59)
Globulin, Total: 2.3 g/dL (ref 1.5–4.5)
Glucose: 69 mg/dL (ref 65–99)
Potassium: 4.9 mmol/L (ref 3.5–5.2)
Sodium: 140 mmol/L (ref 134–144)
Total Protein: 6.9 g/dL (ref 6.0–8.5)

## 2019-09-05 LAB — CBC WITH DIFFERENTIAL/PLATELET
Basophils Absolute: 0 10*3/uL (ref 0.0–0.2)
Basos: 1 %
EOS (ABSOLUTE): 0.1 10*3/uL (ref 0.0–0.4)
Eos: 3 %
Hematocrit: 39.3 % (ref 34.0–46.6)
Hemoglobin: 13.3 g/dL (ref 11.1–15.9)
Immature Grans (Abs): 0 10*3/uL (ref 0.0–0.1)
Immature Granulocytes: 0 %
Lymphocytes Absolute: 1.4 10*3/uL (ref 0.7–3.1)
Lymphs: 35 %
MCH: 32.5 pg (ref 26.6–33.0)
MCHC: 33.8 g/dL (ref 31.5–35.7)
MCV: 96 fL (ref 79–97)
Monocytes Absolute: 0.6 10*3/uL (ref 0.1–0.9)
Monocytes: 14 %
Neutrophils Absolute: 1.8 10*3/uL (ref 1.4–7.0)
Neutrophils: 47 %
Platelets: 230 10*3/uL (ref 150–450)
RBC: 4.09 x10E6/uL (ref 3.77–5.28)
RDW: 11.4 % — ABNORMAL LOW (ref 11.7–15.4)
WBC: 3.9 10*3/uL (ref 3.4–10.8)

## 2019-09-05 NOTE — Progress Notes (Signed)
CBC and CMP are within normal limits.

## 2019-09-07 ENCOUNTER — Telehealth: Payer: Self-pay | Admitting: *Deleted

## 2019-09-07 NOTE — Telephone Encounter (Signed)
DOS 09/21/2019 Barbie Banner OSTEOTOMY - 46047 AND HAMMER TOE REPAIR 2ND - [id number] OF THE LEFT FOOT  [employer]HC: Eligibility Date - 08/11/2019 - Present  Plan Deductible Per Service Year $0.00 of $0.00 Met  Out-of-Pocket Maximum Per Service Year $0.00 of $3,600.00 Met Remaining: $3,600.00    This [employer] Medicare Advantage members plan does not currently require a prior authorization for these services. If you have general questions about the prior authorization requirements, please call us at [phone] or visit [email or web address] > Clinician Resources > Advance and Admission Notification Requirements. The number above acknowledges your notification. Please write this number down for future reference. Notification is not a guarantee of coverage or payment.  Decision ID #:[id number]

## 2019-09-18 ENCOUNTER — Encounter: Payer: Self-pay | Admitting: Podiatry

## 2019-09-18 ENCOUNTER — Telehealth: Payer: Self-pay | Admitting: *Deleted

## 2019-09-18 ENCOUNTER — Other Ambulatory Visit: Payer: Self-pay | Admitting: Rheumatology

## 2019-09-18 DIAGNOSIS — M0579 Rheumatoid arthritis with rheumatoid factor of multiple sites without organ or systems involvement: Secondary | ICD-10-CM

## 2019-09-18 NOTE — Telephone Encounter (Signed)
Pt states she has surgery scheduled 09/21/2019 with Dr. Logan Bores and checking MyChart there are 3 different dates and none are her surgery day.

## 2019-09-18 NOTE — Telephone Encounter (Signed)
Last Visit: 05/18/2019 Next Visit: 10/11/2019 Labs: 09/04/2019 CBC and CMP are within normal limits. Eye exam: 07/31/2019  Okay to refill per Dr. Corliss Skains.

## 2019-09-21 ENCOUNTER — Encounter: Payer: Self-pay | Admitting: Podiatry

## 2019-09-21 ENCOUNTER — Other Ambulatory Visit: Payer: Self-pay | Admitting: Podiatry

## 2019-09-21 DIAGNOSIS — M2022 Hallux rigidus, left foot: Secondary | ICD-10-CM | POA: Diagnosis not present

## 2019-09-21 DIAGNOSIS — M2042 Other hammer toe(s) (acquired), left foot: Secondary | ICD-10-CM | POA: Diagnosis not present

## 2019-09-21 MED ORDER — OXYCODONE-ACETAMINOPHEN 5-325 MG PO TABS: 1.0000 | ORAL_TABLET | Freq: Four times a day (QID) | ORAL | 0 refills | Status: DC | PRN

## 2019-09-21 NOTE — Progress Notes (Signed)
PRN postop 

## 2019-09-23 ENCOUNTER — Ambulatory Visit: Payer: Medicare Other | Attending: Internal Medicine

## 2019-09-23 DIAGNOSIS — Z23 Encounter for immunization: Secondary | ICD-10-CM | POA: Insufficient documentation

## 2019-09-23 NOTE — Progress Notes (Signed)
   Covid-19 Vaccination Clinic  Name:  Linda Berg    MRN: [id number] DOB: 04/27/1946  09/23/2019  Ms. Linda Berg was observed post Covid-19 immunization for 15 minutes without incidence. She was provided with Vaccine Information Sheet and instruction to access the V-Safe system.   Ms. Linda Berg was instructed to call 911 with any severe reactions post vaccine: Marland Kitchen Difficulty breathing  . Swelling of your face and throat  . A fast heartbeat  . A bad rash all over your body  . Dizziness and weakness    Immunizations Administered    Name Date Dose VIS Date Route   Pfizer COVID-19 Vaccine 09/23/2019 10:55 AM 0.3 mL 07/21/2019 Intramuscular   Manufacturer: [employer], [employer]   Lot: [id number]   NDC: [address]-2

## 2019-09-27 ENCOUNTER — Ambulatory Visit (INDEPENDENT_AMBULATORY_CARE_PROVIDER_SITE_OTHER): Payer: Medicare Other | Admitting: Podiatry

## 2019-09-27 ENCOUNTER — Ambulatory Visit (INDEPENDENT_AMBULATORY_CARE_PROVIDER_SITE_OTHER): Payer: Medicare Other

## 2019-09-27 ENCOUNTER — Other Ambulatory Visit: Payer: Self-pay

## 2019-09-27 DIAGNOSIS — Z9889 Other specified postprocedural states: Secondary | ICD-10-CM

## 2019-09-27 DIAGNOSIS — M2042 Other hammer toe(s) (acquired), left foot: Secondary | ICD-10-CM

## 2019-10-01 NOTE — Progress Notes (Signed)
   Subjective:  Patient presents today status post Akin bunionectomy and DIPJ 2nd digit left. DOS: 09/21/2019. She states she is doing well. She reports some mild intermittent pain but denies modifying factors. She has been using the CAM boot as directed. Patient is here for further evaluation and treatment.    Past Medical History:  Diagnosis Date  . Barrett esophagus   . Diverticulosis   . Dyslipidemia   . Facial numbness   . GERD (gastroesophageal reflux disease)   . Hearing loss   . Hiatal hernia   . Hypothyroidism   . Rheumatoid arteritis (HCC)       Objective/Physical Exam Neurovascular status intact.  Skin incisions appear to be well coapted with sutures and staples intact. No sign of infectious process noted. No dehiscence. No active bleeding noted. Moderate edema noted to the surgical extremity.  Radiographic Exam:  Orthopedic hardware and osteotomies sites appear to be stable with routine healing.  Assessment: 1. s/p Akin bunionectomy and DIPJ 2nd digit left. DOS: 09/21/2019   Plan of Care:  1. Patient was evaluated. X-rays reviewed 2. Dressing changed. Keep clean, dry and intact for one week.  3. Continue weightbearing in CAM boot.  4. Return to clinic in one week.    Felecia Shelling, DPM [facility]  Dr. Felecia Shelling, DPM    [address]                                        [place], [place] [address]                Office [phone]  Fax [phone]

## 2019-10-03 ENCOUNTER — Telehealth: Payer: Self-pay | Admitting: Rheumatology

## 2019-10-03 NOTE — Telephone Encounter (Signed)
Patient advise she is not due for labs at this time.

## 2019-10-03 NOTE — Progress Notes (Signed)
Office Visit Note  Patient: Linda Berg             Date of Birth: 1946-05-25           MRN: [id number]             PCP: Richmond Campbell., PA-C Referring: Richmond Campbell., PA-C Visit Date: 10/06/2019 Occupation: @GUAROCC @  Subjective:  Medication Monitoring    History of Present Illness: Linda Berg is a 74 y.o. female with a past medical history of seropositive rheumatoid arthritis, osteoarthritis, and osteopenia. Two weeks ago she received surgery for a hammertoe of her left foot. She states that she tolerated the surgery well. She presents to the office today with walking shoe. She endorses pain and swelling of her left foot that has increased since having her stiches removed this past Wednesday. She states that she makes sure to ice and keep her foot elevated at home. She denies any joint pain or other joint swelling at this time. She states that she may not be noticing her usual joint pain as much right now because of her left foot pain. She currently is on Plaquenil 200 mg 1 tablet by mouth daily and is tolerating it well. She received her last Plaquenil eye exam in December of 2020. She has not been able to walk or exercise much lately due to her recent surgery. She continues to experience dry eyes and dry mouth. She also endorses continued trochanteric bursitis, which she attests to having to place more pressure on one side due to her recent surgery. She denies any cervical pain at this time. She continues to take vitamin D and calcium supplementation.  Activities of Daily Living:  Patient reports morning stiffness for 0 none.   Patient Reports nocturnal pain.  Difficulty dressing/grooming: Denies Difficulty climbing stairs: Denies Difficulty getting out of chair: Denies Difficulty using hands for taps, buttons, cutlery, and/or writing: Denies  Review of Systems  Constitutional: Negative for fatigue, night sweats, weight gain and weight loss.  HENT: Positive for  mouth dryness. Negative for mouth sores, trouble swallowing, trouble swallowing and nose dryness.   Eyes: Positive for dryness. Negative for pain, redness and visual disturbance.  Respiratory: Negative for cough, shortness of breath and difficulty breathing.   Cardiovascular: Positive for swelling in legs/feet. Negative for chest pain, palpitations, hypertension and irregular heartbeat.  Gastrointestinal: Positive for constipation. Negative for blood in stool and diarrhea.  Endocrine: Negative for increased urination.  Genitourinary: Negative for difficulty urinating and vaginal dryness.  Musculoskeletal: Positive for arthralgias and joint pain. Negative for joint swelling, myalgias, muscle weakness, morning stiffness, muscle tenderness and myalgias.  Skin: Negative for color change, rash, hair loss, skin tightness, ulcers and sensitivity to sunlight.  Allergic/Immunologic: Negative for susceptible to infections.  Neurological: Negative for dizziness, numbness, memory loss, night sweats and weakness.  Hematological: Negative for bruising/bleeding tendency and swollen glands.  Psychiatric/Behavioral: Positive for sleep disturbance. Negative for depressed mood. The patient is not nervous/anxious.     PMFS History:  Patient Active Problem List   Diagnosis Date Noted  . Language difficulty 08/17/2018  . Nonintractable headache 08/17/2018  . Upper airway cough syndrome 10/13/2017  . Pneumomediastinum (HCC)   . Pneumoperitoneum   . Acute respiratory failure (HCC)   . Pneumothorax, traumatic   . Anaphylaxis 08/17/2017  . Trochanteric bursitis of both hips 03/16/2017  . Rheumatoid arthritis involving multiple sites with positive rheumatoid factor (HCC) 10/09/2016  . Sicca syndrome, unspecified (HCC) 10/09/2016  .  Osteopenia of multiple sites 10/09/2016  . History of scoliosis 10/09/2016  . History of gastroesophageal reflux (GERD) 10/09/2016  . History of hypothyroidism 10/09/2016  . History  of hyperlipidemia 10/09/2016  . High risk medication use 10/09/2016  . Primary osteoarthritis of both hands 10/09/2016  . Paresthesia 10/11/2014  . Hyperlipidemia 10/11/2014  . Small vessel disease, 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/11/2014  . Tinnitus 09/17/2014  . Abdominal pain 09/05/2014  . Acute cystitis 09/05/2014  . Arthritis 09/05/2014  . Cough 09/05/2014  . Esophagitis 09/05/2014  . Facial numbness 09/05/2014  . Difficulty hearing 09/05/2014  . H/O disease 09/05/2014  . HLD (hyperlipidemia) 09/05/2014  . Muscle ache 09/05/2014  . Symptoms involving urinary system 09/05/2014  . Awareness of heartbeats 09/05/2014  . Acne erythematosa 09/05/2014  . Absence of bladder continence 09/05/2014  . Urgency of micturation 09/05/2014  . Routine general medical examination at a health care facility 09/05/2014  . DYSPHAGIA 01/09/2009  . HYPOTHYROIDISM 11/04/2007  . DYSLIPIDEMIA 11/04/2007  . GERD 11/04/2007  . 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/04/2007  . DUODENITIS, WITHOUT HEMORRHAGE 11/04/2007  . HIATAL HERNIA 11/04/2007  . DIVERTICULOSIS, COLON 11/04/2007  . CONSTIPATION, CHRONIC 11/04/2007  . RECTAL BLEEDING 11/04/2007    Past Medical History:  Diagnosis Date  . Barrett esophagus   . Diverticulosis   . Dyslipidemia   . Facial numbness   . GERD (gastroesophageal reflux disease)   . Hearing loss   . Hiatal hernia   . Hypothyroidism   . Rheumatoid arteritis (HCC)     Family History  Problem Relation Age of Onset  . Heart disease Mother   . Rheumatic fever Mother   . Heart failure Mother   . Stroke Father   . Diabetes Father   . Heart failure Father   . Heart disease Maternal Aunt        aunt x 2  . Irritable bowel syndrome Son   . GER disease Son   . Colon cancer Neg Hx    Past Surgical History:  Procedure Laterality Date  . CATARACT EXTRACTION, BILATERAL     04/13/2019, 04/27/2019  . CHEST TUBE INSERTION  08/17/2017  . COLONOSCOPY  08/17/2017  . HAMMER TOE SURGERY Left   .  INTRAUTERINE DEVICE INSERTION    . IUD REMOVAL    . KNEE ARTHROPLASTY    . KNEE ARTHROSCOPY  12/25/11   left  . LEFT OOPHORECTOMY  03/13/98  . MANDIBLE SURGERY  9/93   pallete expansion  . TONSILLECTOMY    . TUBAL LIGATION     Social History   Social History Narrative   Live with husband at home.   Left-handed.   1-2 cups per day.   Immunization History  Administered Date(s) Administered  . Influenza, High Dose Seasonal PF 04/29/2016, 05/03/2017  . Influenza-Unspecified 05/21/2010, 04/28/2013, 05/17/2014, 04/30/2015, 05/03/2017, 05/10/2018  . PFIZER SARS-COV-2 Vaccination 09/02/2019, 09/23/2019  . Pneumococcal Conjugate-13 03/29/2014  . Pneumococcal Polysaccharide-23 05/21/2010, 09/05/2018  . Zoster 10/19/2007  . Zoster Recombinat (Shingrix) 12/10/2017, 04/08/2018     Objective: Vital Signs: BP 95/66 (BP Location: Left Arm, Patient Position: Sitting, Cuff Size: Normal)   Pulse 77   Resp 18   Ht 5' 1.5" (1.562 m)   Wt 116 lb 3.2 oz (52.7 kg)   BMI 21.60 kg/m    Physical Exam Vitals and nursing note reviewed.  Constitutional:      General: She is not in acute distress.    Appearance: She is well-developed.  HENT:     Head: Normocephalic  and atraumatic.  Eyes:     Conjunctiva/sclera: Conjunctivae normal.  Cardiovascular:     Rate and Rhythm: Normal rate and regular rhythm.     Heart sounds: Normal heart sounds.  Pulmonary:     Effort: Pulmonary effort is normal.     Breath sounds: Normal breath sounds.  Abdominal:     General: Bowel sounds are normal.     Palpations: Abdomen is soft.  Musculoskeletal:        General: Swelling (Left ankle) and tenderness (Left ankle and foot) present. Normal range of motion.     Cervical back: Normal range of motion and neck supple.  Lymphadenopathy:     Cervical: No cervical adenopathy.  Skin:    General: Skin is warm and dry.     Capillary Refill: Capillary refill takes less than 2 seconds.  Neurological:     Mental Status:  She is alert and oriented to person, place, and time.  Psychiatric:        Behavior: Behavior normal.      Musculoskeletal Exam: Cervical spine with good ROM. Thoracic kyphosis noted. Lumbar spine with limited ROM. No midline spinal tenderness or SI joint tenderness.  Shoulder joints, elbow joints, wrist joints, MCPs, PIPs, and DIPs with good ROM and no synovitis. Bilateral DIP subluxation noted. Complete fist formation bilaterally. DIP and PIP synovial thickening consistent with osteoarthritis of bilateral hands. Bilateral CMC synovial joint thickening. Hip joints with limited ROM. Tenderness over the bilateral trochanteric bursas. Knee joints with good ROM and no warmth or effusion noted. Right ankle, MTP, PIPs, and DIPs with good ROM and no tenderness or synovitis. Tenderness of the left ankle with mild edema noted. Unable to assess left MTPs, PIPs, or DIPs due to pain from recent surgery.   CDAI Exam: CDAI Score: -- Patient Global: --; Provider Global: -- Swollen: --; Tender: -- Joint Exam 10/06/2019   No joint exam has been documented for this visit   There is currently no information documented on the homunculus. Go to the Rheumatology activity and complete the homunculus joint exam.  Investigation: No additional findings.  Imaging: DG Foot Complete Left  Result Date: 09/27/2019 Please see detailed radiograph report in office note.   Recent Labs: Lab Results  Component Value Date   WBC 3.9 09/04/2019   HGB 13.3 09/04/2019   PLT 230 09/04/2019   NA 140 09/04/2019   K 4.9 09/04/2019   CL 101 09/04/2019   CO2 25 09/04/2019   GLUCOSE 69 09/04/2019   BUN 20 09/04/2019   CREATININE 0.70 09/04/2019   BILITOT <0.2 09/04/2019   ALKPHOS 94 09/04/2019   AST 22 09/04/2019   ALT 17 09/04/2019   PROT 6.9 09/04/2019   ALBUMIN 4.6 09/04/2019   CALCIUM 9.7 09/04/2019   GFRAA 99 09/04/2019    Speciality Comments: PLQ eye exam: 07/31/2019 WNL @ [facility] Follow up in  1 year  Procedures:  No procedures performed Allergies: Chlorpheniramine, Ciprofloxacin, Terbinafine, and Lamisil at [b.f.i.]   Assessment / Plan:     Visit Diagnoses: Rheumatoid arthritis involving multiple sites with positive rheumatoid factor (HCC) - No synovitis present on exam. No recent rheumatoid arthritis flares. Patient states that she is having minimal joint pain. She notes that it may be masked due to her recent left hammertoe surgery causing significant pain in her left foot. Synovial thickening present in bilateral PIP, DIP, and CMC joints consistent with osteoarthritis. Bilateral DIP subluxation present. She is clinically doing well on Plaquenil 200  mg 1 tablet by mouth daily. She will continue on her current Plaquenil regimen. Her last eye exam was 07/31/2019.  High risk medication use - Plaquenil 200 mg 1 tablet by mouth daily. Eye exam: 07/31/2019. She is tolerating this medication well. Her most recent CMP and CBC were completed 09/03/2019. She is to follow up every 5 months with lab work to monitor for drug toxicity.  Sicca syndrome ([place]) - She continues to experience chronic dry eyes and dry mouth. She uses over the counter products for symptomatic relief.   Primary osteoarthritis of both hands - Bilateral CMC, PIP, and DIP synovial thickening consistent with osteoarthritis. No synovitis or tenderness noted. Complete fist formation bilaterally. Encouraged patient to perform hand strengthening exercises and discussed ways to promote joint protection.  Trochanteric bursitis of both hips - She continues to experience persistent tenderness over the right and left trochanteric bursas.   Spinal stenosis of cervical region - She denies cervical pain at this time. No tenderness to palpation of the cervical spine. She reports occasional radiculopathy of the cervical spine.   Osteopenia of multiple sites - DEXA 05/01/2019 T score -2.4 1/3 right distal radius.  Significant changes in BMD  and site measured -6% in BMD of the left femur neck. She continues current vitamin D and calcium supplementation. We will recheck bone density at the 2 year mark since last DEXA scan.  Other medical conditions are listed as follows:  History of hyperlipidemia  History of hypothyroidism  History of gastroesophageal reflux (GERD)  History of scoliosis  History of diverticulitis  Orders: No orders of the defined types were placed in this encounter.  No orders of the defined types were placed in this encounter.     Follow-Up Instructions: Return in about 5 months (around 03/04/2020).   Bo Merino, MD  Note - This record has been created using Editor, commissioning.  Chart creation errors have been sought, but may not always  have been located. Such creation errors do not reflect on  the standard of medical care.

## 2019-10-03 NOTE — Telephone Encounter (Signed)
Patient scheduled an appointment with Dr. Corliss Skains on 10/06/19 and is checking if she needs labwork before her appointment.  Patient is requesting a return call.

## 2019-10-04 ENCOUNTER — Other Ambulatory Visit: Payer: Self-pay

## 2019-10-04 ENCOUNTER — Ambulatory Visit (INDEPENDENT_AMBULATORY_CARE_PROVIDER_SITE_OTHER): Payer: Medicare Other | Admitting: Podiatry

## 2019-10-04 DIAGNOSIS — M2042 Other hammer toe(s) (acquired), left foot: Secondary | ICD-10-CM | POA: Diagnosis not present

## 2019-10-04 DIAGNOSIS — Z9889 Other specified postprocedural states: Secondary | ICD-10-CM

## 2019-10-04 DIAGNOSIS — M21612 Bunion of left foot: Secondary | ICD-10-CM

## 2019-10-06 ENCOUNTER — Encounter: Payer: Self-pay | Admitting: Physician Assistant

## 2019-10-06 ENCOUNTER — Ambulatory Visit: Payer: Medicare Other | Admitting: Rheumatology

## 2019-10-06 ENCOUNTER — Other Ambulatory Visit: Payer: Self-pay

## 2019-10-06 VITALS — BP 95/66 | HR 77 | Resp 18 | Ht 61.5 in | Wt 116.2 lb

## 2019-10-06 DIAGNOSIS — M3500 Sicca syndrome, unspecified: Secondary | ICD-10-CM

## 2019-10-06 DIAGNOSIS — Z79899 Other long term (current) drug therapy: Secondary | ICD-10-CM

## 2019-10-06 DIAGNOSIS — M0579 Rheumatoid arthritis with rheumatoid factor of multiple sites without organ or systems involvement: Secondary | ICD-10-CM | POA: Diagnosis not present

## 2019-10-06 DIAGNOSIS — Z8719 Personal history of other diseases of the digestive system: Secondary | ICD-10-CM

## 2019-10-06 DIAGNOSIS — M7062 Trochanteric bursitis, left hip: Secondary | ICD-10-CM

## 2019-10-06 DIAGNOSIS — Z8739 Personal history of other diseases of the musculoskeletal system and connective tissue: Secondary | ICD-10-CM

## 2019-10-06 DIAGNOSIS — M19041 Primary osteoarthritis, right hand: Secondary | ICD-10-CM | POA: Diagnosis not present

## 2019-10-06 DIAGNOSIS — M7061 Trochanteric bursitis, right hip: Secondary | ICD-10-CM

## 2019-10-06 DIAGNOSIS — M8589 Other specified disorders of bone density and structure, multiple sites: Secondary | ICD-10-CM

## 2019-10-06 DIAGNOSIS — M4802 Spinal stenosis, cervical region: Secondary | ICD-10-CM

## 2019-10-06 DIAGNOSIS — M19042 Primary osteoarthritis, left hand: Secondary | ICD-10-CM

## 2019-10-06 DIAGNOSIS — Z8639 Personal history of other endocrine, nutritional and metabolic disease: Secondary | ICD-10-CM

## 2019-10-06 NOTE — Patient Instructions (Signed)
Standing Labs We placed an order today for your standing lab work.    Please come back and get your standing labs in June  We have open lab daily Monday through Thursday from 8:30-12:30 PM and 1:30-4:30 PM and Friday from 8:30-12:30 PM and 1:30-4:00 PM at the office of Dr. Skarlet Lyons.   You may experience shorter wait times on Monday and Friday afternoons. The office is located at [address], [address], [place], [place] [address] No appointment is necessary.   Labs are drawn by [facility].  You may receive a bill from [facility] for your lab work.  If you wish to have your labs drawn at another location, please call the office 24 hours in advance to send orders.  If you have any questions regarding directions or hours of operation,  please call [phone].   Just as a reminder please drink plenty of water prior to coming for your lab work. Thanks!  

## 2019-10-11 ENCOUNTER — Ambulatory Visit: Payer: Medicare Other | Admitting: Rheumatology

## 2019-10-16 ENCOUNTER — Encounter: Payer: Medicare Other | Admitting: Podiatry

## 2019-10-18 ENCOUNTER — Encounter: Payer: Medicare Other | Admitting: Podiatry

## 2019-10-18 ENCOUNTER — Telehealth: Payer: Self-pay

## 2019-10-18 NOTE — Telephone Encounter (Signed)
I spoke with pt and I told her I had reviewed the last clinicals and she had been instructed to begin to transfer to surgery shoe from the surgery boot. I told pt that she may be too active in the surgery shoe. I told pt, she should start the surgery shoe again, begin the surgery shoe in the morning and wear a long as comfortable then switch back to the cam boot and do this daily and rest and elevate with periods of discomfort and swelling. Pt denied redness, streaking or fever and I told pt to call if problems.

## 2019-10-18 NOTE — Progress Notes (Signed)
   Subjective:  Patient presents today status post Akin bunionectomy and DIPJ 2nd digit left. DOS: 09/21/2019. She reports some minor pain. She reports mild associated swelling as well. She has been using the CAM boot as directed. She denies any modifying factors. Patient is here for further evaluation and treatment.    Past Medical History:  Diagnosis Date  . Barrett esophagus   . Diverticulosis   . Dyslipidemia   . Facial numbness   . GERD (gastroesophageal reflux disease)   . Hearing loss   . Hiatal hernia   . Hypothyroidism   . Rheumatoid arteritis (HCC)       Objective/Physical Exam Neurovascular status intact.  Skin incisions appear to be well coapted with sutures and staples intact. No sign of infectious process noted. No dehiscence. No active bleeding noted. Moderate edema noted to the surgical extremity.  Assessment: 1. s/p Akin bunionectomy and DIPJ 2nd digit left. DOS: 09/21/2019   Plan of Care:  1. Patient was evaluated.  2. Sutures removed.  3. Post op shoe dispensed. Discontinue using CAM boot.  4. Return to clinic in 4 weeks for follow up X-Ray.    Felecia Shelling, DPM [facility]  Dr. Felecia Shelling, DPM    [address]                                        [place], [place] [address]                Office [phone]  Fax [phone]

## 2019-10-18 NOTE — Telephone Encounter (Signed)
Pt called stating that Dr. Logan Bores is her doctor. Pt has an appointment to see him 11/01/19 post op visit, DOS 09/21/19. Pt toes are still swollen and are painful even with the boot on. Pt states that the pain isn't getting any better.

## 2019-11-01 ENCOUNTER — Other Ambulatory Visit: Payer: Self-pay

## 2019-11-01 ENCOUNTER — Ambulatory Visit (INDEPENDENT_AMBULATORY_CARE_PROVIDER_SITE_OTHER): Payer: Medicare Other | Admitting: Podiatry

## 2019-11-01 ENCOUNTER — Ambulatory Visit (INDEPENDENT_AMBULATORY_CARE_PROVIDER_SITE_OTHER): Payer: Medicare Other

## 2019-11-01 DIAGNOSIS — Z9889 Other specified postprocedural states: Secondary | ICD-10-CM

## 2019-11-01 DIAGNOSIS — M2042 Other hammer toe(s) (acquired), left foot: Secondary | ICD-10-CM

## 2019-11-01 DIAGNOSIS — M21612 Bunion of left foot: Secondary | ICD-10-CM

## 2019-11-05 NOTE — Progress Notes (Signed)
   Subjective:  Patient presents today status post Akin bunionectomy and DIPJ 2nd digit left. DOS: 09/21/2019. She states she is doing well and improving. She reports some difficulty with ambulation and has been using the post op shoe as directed. Patient is here for further evaluation and treatment.    Past Medical History:  Diagnosis Date  . Barrett esophagus   . Diverticulosis   . Dyslipidemia   . Facial numbness   . GERD (gastroesophageal reflux disease)   . Hearing loss   . Hiatal hernia   . Hypothyroidism   . Rheumatoid arteritis (HCC)       Objective/Physical Exam Neurovascular status intact.  Skin incisions appear to be well coapted. No sign of infectious process noted. No dehiscence. No active bleeding noted. Moderate edema noted to the surgical extremity.  Radiographic Exam:  Orthopedic hardware and osteotomies sites appear to be stable with routine healing.  Assessment: 1. s/p Akin bunionectomy and DIPJ 2nd digit left. DOS: 09/21/2019   Plan of Care:  1. Patient was evaluated. X-Rays reviewed.  2. Continue using post op shoe.  3. Transition out of post op shoe when swelling in the toes improves.  4. Return to clinic in 6 weeks.    Felecia Shelling, DPM [facility]  Dr. Felecia Shelling, DPM    [address]                                        [place], [place] [address]                Office [phone]  Fax [phone]

## 2019-12-04 ENCOUNTER — Telehealth: Payer: Self-pay | Admitting: Rheumatology

## 2019-12-04 NOTE — Progress Notes (Signed)
Office Visit Note  Patient: Linda Berg             Date of Birth: [birth year]           MRN: [id number]             PCP: Aletha Halim., PA-C Referring: Aletha Halim., PA-C Visit Date: 12/05/2019 Occupation: @GUAROCC @  Subjective:  Pain in both hands   History of Present Illness: Linda Berg is a 74 y.o. female with seropositive rheumatoid arthritis and osteoarthritis.  She is taking Plaquenil 200 mg 1 tablet by mouth daily.  She denies any recent rheumatoid arthritis flares.  She continues to have discomfort in both hands but denies any joint swelling.  She states that she has been having more frequent symptoms of Raynaud's recently.  She states that she has not been able to identify a trigger.  She denies any digital ulcerations. Patient reports that she had hammertoe correction and bunionectomy of the left foot performed by Dr. Amalia Hailey on 09/21/2019.  She continues to wear the postop shoe and has had increased pain and inflammation than she was expecting.  She will be following up with Dr. Amalia Hailey on 12/13/2019.    Activities of Daily Living:  Patient reports morning stiffness for  5  minutes.   Patient Reports nocturnal pain.  Difficulty dressing/grooming: Denies Difficulty climbing stairs: Denies Difficulty getting out of chair: Denies Difficulty using hands for taps, buttons, cutlery, and/or writing: Denies  Review of Systems  Constitutional: Negative for fatigue.  HENT: Positive for mouth dryness. Negative for mouth sores and nose dryness.   Eyes: Positive for dryness. Negative for pain and visual disturbance.  Respiratory: Negative for cough, hemoptysis, shortness of breath and difficulty breathing.   Cardiovascular: Positive for palpitations. Negative for chest pain, hypertension and swelling in legs/feet.  Gastrointestinal: Positive for constipation. Negative for blood in stool and diarrhea.  Endocrine: Negative for increased urination.  Genitourinary:  Negative for painful urination.  Musculoskeletal: Positive for arthralgias, joint pain and morning stiffness. Negative for joint swelling, myalgias, muscle weakness, muscle tenderness and myalgias.  Skin: Positive for color change. Negative for pallor, rash, hair loss, nodules/bumps, skin tightness, ulcers and sensitivity to sunlight.  Allergic/Immunologic: Negative for susceptible to infections.  Neurological: Negative for dizziness, numbness, headaches and weakness.  Hematological: Negative for swollen glands.  Psychiatric/Behavioral: Negative for depressed mood and sleep disturbance. The patient is not nervous/anxious.     PMFS History:  Patient Active Problem List   Diagnosis Date Noted  . Language difficulty 08/17/2018  . Nonintractable headache 08/17/2018  . Upper airway cough syndrome 10/13/2017  . Pneumomediastinum ([place])   . Pneumoperitoneum   . Acute respiratory failure ([place])   . Pneumothorax, traumatic   . Anaphylaxis 08/17/2017  . Trochanteric bursitis of both hips 03/16/2017  . Rheumatoid arthritis involving multiple sites with positive rheumatoid factor ([place]) 10/09/2016  . Sicca syndrome, unspecified ([place]) 10/09/2016  . Osteopenia of multiple sites 10/09/2016  . History of scoliosis 10/09/2016  . History of gastroesophageal reflux (GERD) 10/09/2016  . History of hypothyroidism 10/09/2016  . History of hyperlipidemia 10/09/2016  . High risk medication use 10/09/2016  . Primary osteoarthritis of both hands 10/09/2016  . Paresthesia 10/11/2014  . Hyperlipidemia 10/11/2014  . Small vessel disease, 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/11/2014  . Tinnitus 09/17/2014  . Abdominal pain 09/05/2014  . Acute cystitis 09/05/2014  . Arthritis 09/05/2014  . Cough 09/05/2014  . Esophagitis 09/05/2014  . Facial numbness 09/05/2014  . Difficulty  hearing 09/05/2014  . H/O disease 09/05/2014  . HLD (hyperlipidemia) 09/05/2014  . Muscle ache 09/05/2014  . Symptoms involving urinary system  09/05/2014  . Awareness of heartbeats 09/05/2014  . Acne erythematosa 09/05/2014  . Absence of bladder continence 09/05/2014  . Urgency of micturation 09/05/2014  . Routine general medical examination at a health care facility 09/05/2014  . DYSPHAGIA 01/09/2009  . HYPOTHYROIDISM 11/04/2007  . DYSLIPIDEMIA 11/04/2007  . GERD 11/04/2007  . 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/04/2007  . DUODENITIS, WITHOUT HEMORRHAGE 11/04/2007  . HIATAL HERNIA 11/04/2007  . DIVERTICULOSIS, COLON 11/04/2007  . CONSTIPATION, CHRONIC 11/04/2007  . RECTAL BLEEDING 11/04/2007    Past Medical History:  Diagnosis Date  . Barrett esophagus   . Diverticulosis   . Dyslipidemia   . Facial numbness   . GERD (gastroesophageal reflux disease)   . Hearing loss   . Hiatal hernia   . Hypothyroidism   . Rheumatoid arteritis (HCC)     Family History  Problem Relation Age of Onset  . Heart disease Mother   . Rheumatic fever Mother   . Heart failure Mother   . Stroke Father   . Diabetes Father   . Heart failure Father   . Heart disease Maternal Aunt        aunt x 2  . Irritable bowel syndrome Son   . GER disease Son   . Colon cancer Neg Hx    Past Surgical History:  Procedure Laterality Date  . CATARACT EXTRACTION, BILATERAL     04/13/2019, 04/27/2019  . CHEST TUBE INSERTION  08/17/2017  . COLONOSCOPY  08/17/2017  . HAMMER TOE SURGERY Left   . INTRAUTERINE DEVICE INSERTION    . IUD REMOVAL    . KNEE ARTHROPLASTY    . KNEE ARTHROSCOPY  12/25/11   left  . LEFT OOPHORECTOMY  03/13/98  . MANDIBLE SURGERY  9/93   pallete expansion  . TONSILLECTOMY    . TUBAL LIGATION     Social History   Social History Narrative   Live with husband at home.   Left-handed.   1-2 cups per day.   Immunization History  Administered Date(s) Administered  . Influenza, High Dose Seasonal PF 04/29/2016, 05/03/2017  . Influenza-Unspecified 05/21/2010, 04/28/2013, 05/17/2014, 04/30/2015, 05/03/2017, 05/10/2018  . PFIZER SARS-COV-2  Vaccination 09/02/2019, 09/23/2019  . Pneumococcal Conjugate-13 03/29/2014  . Pneumococcal Polysaccharide-23 05/21/2010, 09/05/2018  . Zoster 10/19/2007  . Zoster Recombinat (Shingrix) 12/10/2017, 04/08/2018     Objective: Vital Signs: BP 101/63 (BP Location: Right Arm, Patient Position: Sitting, Cuff Size: Normal)   Pulse 74   Resp 16   Ht 5' 1.5" (1.562 m)   Wt 114 lb (51.7 kg)   BMI 21.19 kg/m    Physical Exam Vitals and nursing note reviewed.  Constitutional:      Appearance: She is well-developed.  HENT:     Head: Normocephalic and atraumatic.  Eyes:     Conjunctiva/sclera: Conjunctivae normal.  Pulmonary:     Effort: Pulmonary effort is normal.  Abdominal:     General: Bowel sounds are normal.     Palpations: Abdomen is soft.  Musculoskeletal:     Cervical back: Normal range of motion.  Lymphadenopathy:     Cervical: No cervical adenopathy.  Skin:    General: Skin is warm and dry.     Capillary Refill: Capillary refill takes 2 to 3 seconds.  Neurological:     Mental Status: She is alert and oriented to person, place, and time.  Psychiatric:  Behavior: Behavior normal.      Musculoskeletal Exam: C-spine limited range of motion with lateral rotation.  Thoracic kyphosis noted.  Shoulder joints, with joints, wrist joints, MCPs, PIPs and DIPs good range of motion with no synovitis.  She has PIP and DIP thickening consistent with osteoarthritis of both hands.  Subluxation of several DIP joints noted.  Hip joints have good range of motion with no discomfort at this time.  Knee joints have good range of motion with no warmth or effusion.  Ankle joints have good range of motion with no tenderness or inflammation.  CDAI Exam: CDAI Score: 1  Patient Global: 5 mm; Provider Global: 5 mm Swollen: 0 ; Tender: 0  Joint Exam 12/05/2019   No joint exam has been documented for this visit   There is currently no information documented on the homunculus. Go to the  Rheumatology activity and complete the homunculus joint exam.  Investigation: No additional findings.  Imaging: No results found.  Recent Labs: Lab Results  Component Value Date   WBC 3.9 09/04/2019   HGB 13.3 09/04/2019   PLT 230 09/04/2019   NA 140 09/04/2019   K 4.9 09/04/2019   CL 101 09/04/2019   CO2 25 09/04/2019   GLUCOSE 69 09/04/2019   BUN 20 09/04/2019   CREATININE 0.70 09/04/2019   BILITOT <0.2 09/04/2019   ALKPHOS 94 09/04/2019   AST 22 09/04/2019   ALT 17 09/04/2019   PROT 6.9 09/04/2019   ALBUMIN 4.6 09/04/2019   CALCIUM 9.7 09/04/2019   GFRAA 99 09/04/2019    Speciality Comments: PLQ eye exam: 07/31/2019 WNL @ [facility] Follow up in 1 year  Procedures:  No procedures performed Allergies: Chlorpheniramine, Ciprofloxacin, Terbinafine, and Lamisil at [b.f.i.]   Assessment / Plan:     Visit Diagnoses: Rheumatoid arthritis involving multiple sites with positive rheumatoid factor (HCC): She has no synovitis on exam.  She has not had any recent rheumatoid arthritis flares.  She is clinically doing well on Plaquenil 200 mg 1 tablet by mouth daily.  She experiences chronic pain in both hands due to underlying osteoarthritis.  She has PIP and DIP thickening consistent with osteoarthritis of both hands.  No tenderness or synovitis was noted on exam today.  Joint protection and muscle strengthening were discussed.  She is not experiencing any other joint pain or inflammation at this time.  She will continue taking Plaquenil as prescribed.  She does not need a refill at this time.  She was advised to notify us if she develops increased joint pain or joint swelling.  She will follow-up in the office in 5 months.  High risk medication use -  Plaquenil 200 mg 1 tablet by mouth daily. Eye exam: 07/31/2019.  CBC and CMP were drawn on 09/04/2019.  Future orders were placed today.  Sicca syndrome (HCC): She has chronic sicca symptoms.  She uses Restasis as needed for  symptomatic relief.  We will check a ANA, Ro, and La antibody today.  Primary osteoarthritis of both hands: She has PIP and DIP thickening consistent with osteoarthritis of both hands.  She has subluxation of several DIP joints.  Joint protection and muscle strengthening were discussed.  She was given a handout of hand exercises to perform.  Trochanteric bursitis of both hips: She has tenderness over bilateral trochanteric bursa.  She was encouraged to perform stretching exercises daily.  Spinal stenosis of cervical region: She has chronic neck pain and stiffness.  She has limited lateral  rotation bilaterally.  She not experiencing any symptoms of radiculopathy currently.  Osteopenia of multiple sites - - DEXA 05/01/2019 T score -2.4 1/3 right distal radius.  Significant changes in BMD and site measured -6% in BMD of the left femur neck.  She is taking vitamin D 1000 units daily and obtains calcium from her diet.  We will check a vitamin D level with her upcoming lab work.  Raynaud's syndrome without gangrene: She has new onset symptoms of Raynaud's.  She has been experiencing frequent pain, paresthesias, and color changes in her fingertips.  She has delayed capillary refill 2 to 3 seconds.  No digital ulcerations or signs of gangrene were noted.  No obvious nailbed capillary changes were noted.  We discussed the importance of avoiding triggers.  We discussed keeping her core body temperature warm, wearing gloves and thick socks, and drinking warm fluids.  We will check the following autoimmune lab work today.  She will continue taking Plaquenil as prescribed and aspirin 81 mg 1 tablet daily.  She is not a good candidate for amlodipine due to her blood pressure lowering on the lower side.  Other medical conditions are listed as follows:  History of hyperlipidemia  History of hypothyroidism  History of gastroesophageal reflux (GERD)  History of scoliosis  History of diverticulitis  Iliotibial  band syndrome, unspecified laterality    Orders: No orders of the defined types were placed in this encounter.  No orders of the defined types were placed in this encounter.   Face-to-face time spent with patient was 30 minutes. Greater than 50% of time was spent in counseling and coordination of care.  Follow-Up Instructions: Return in about 5 months (around 05/06/2020) for Rheumatoid arthritis.   Gearldine Bienenstock, PA-C  Note - This record has been created using Dragon software.  Chart creation errors have been sought, but may not always  have been located. Such creation errors do not reflect on  the standard of medical care.

## 2019-12-04 NOTE — Telephone Encounter (Signed)
Spoke with patient and she is now scheduled for an appointment on 12/05/2019 at 10:00am. Patient states the coldness/discoloration happens only intermittently and the only thing that helps is holding a hot cup of coffee or running her hand under hot water. Patient states gloves do not help.

## 2019-12-04 NOTE — Telephone Encounter (Signed)
Patient left a voicemail stating she has developed what she was told years ago was Raynard's syndrome mainly in her right hand middle finger.  Patient states it gets white and throbs and then turns blue.  Patient states the only way to relieve it is to either drink something hot or to run her hand under warm water.  Patient states she has had cold hands in the past, but it has never throbbed and turned colors.  Patient is requesting a return call.

## 2019-12-05 ENCOUNTER — Encounter: Payer: Self-pay | Admitting: Physician Assistant

## 2019-12-05 ENCOUNTER — Other Ambulatory Visit: Payer: Self-pay

## 2019-12-05 ENCOUNTER — Ambulatory Visit: Payer: Medicare Other | Admitting: Physician Assistant

## 2019-12-05 VITALS — BP 101/63 | HR 74 | Resp 16 | Ht 61.5 in | Wt 114.0 lb

## 2019-12-05 DIAGNOSIS — M19041 Primary osteoarthritis, right hand: Secondary | ICD-10-CM | POA: Diagnosis not present

## 2019-12-05 DIAGNOSIS — M8589 Other specified disorders of bone density and structure, multiple sites: Secondary | ICD-10-CM

## 2019-12-05 DIAGNOSIS — Z8739 Personal history of other diseases of the musculoskeletal system and connective tissue: Secondary | ICD-10-CM

## 2019-12-05 DIAGNOSIS — M0579 Rheumatoid arthritis with rheumatoid factor of multiple sites without organ or systems involvement: Secondary | ICD-10-CM

## 2019-12-05 DIAGNOSIS — M3500 Sicca syndrome, unspecified: Secondary | ICD-10-CM

## 2019-12-05 DIAGNOSIS — Z79899 Other long term (current) drug therapy: Secondary | ICD-10-CM

## 2019-12-05 DIAGNOSIS — Z8639 Personal history of other endocrine, nutritional and metabolic disease: Secondary | ICD-10-CM

## 2019-12-05 DIAGNOSIS — M7061 Trochanteric bursitis, right hip: Secondary | ICD-10-CM

## 2019-12-05 DIAGNOSIS — M19042 Primary osteoarthritis, left hand: Secondary | ICD-10-CM

## 2019-12-05 DIAGNOSIS — Z8719 Personal history of other diseases of the digestive system: Secondary | ICD-10-CM

## 2019-12-05 DIAGNOSIS — M4802 Spinal stenosis, cervical region: Secondary | ICD-10-CM

## 2019-12-05 DIAGNOSIS — M7062 Trochanteric bursitis, left hip: Secondary | ICD-10-CM

## 2019-12-05 DIAGNOSIS — I7300 Raynaud's syndrome without gangrene: Secondary | ICD-10-CM

## 2019-12-05 DIAGNOSIS — M7630 Iliotibial band syndrome, unspecified leg: Secondary | ICD-10-CM

## 2019-12-09 ENCOUNTER — Encounter: Payer: Self-pay | Admitting: Rheumatology

## 2019-12-10 LAB — CMP14+EGFR: Bilirubin Total: 0.2 mg/dL (ref 0.0–1.2)

## 2019-12-10 LAB — CBC WITH DIFFERENTIAL/PLATELET
Basophils Absolute: 0 10*3/uL (ref 0.0–0.2)
Hematocrit: 38.3 % (ref 34.0–46.6)
WBC: 4.9 10*3/uL (ref 3.4–10.8)

## 2019-12-10 LAB — BETA-2-GLYCOPROTEIN I ABS, IGG/M/A: Beta-2 Glyco I IgG: <9 GPI IgG units (ref 0–20)

## 2019-12-10 LAB — LUPUS ANTICOAGULANT PANEL: PTT Lupus Anticoagulant: 32.2 s (ref 0.0–51.9)

## 2019-12-11 NOTE — Telephone Encounter (Signed)
Attempted to contact patient and left message on machine to advise patient to call the office to schedule an appointment.

## 2019-12-11 NOTE — Telephone Encounter (Signed)
Please schedule an appointment to discuss lab results.  It will be difficult explained over the phone.

## 2019-12-12 NOTE — Progress Notes (Signed)
Office Visit Note  Patient: Linda Berg             Date of Birth: 06-06-1946           MRN: [id number]             PCP: Richmond Campbell., PA-C Referring: Richmond Campbell., PA-C Visit Date: 12/14/2019 Occupation: @GUAROCC @  Subjective:  Raynaud's phenomenon.   History of Present Illness: Linda Berg is a 74 y.o. female with history of seropositive rheumatoid arthritis, osteoarthritis and Raynaud's phenomenon. She states recently she has been experiencing increased Raynaud's. She believes that she always had cold hands and right hands. She got concerned when her finger turned white. Her rheumatoid arthritis is well controlled on Plaquenil. She continues to have sicca symptoms for which she has been using over-the-counter products which has been helpful.  Activities of Daily Living:  Patient reports morning stiffness for 5-10 minutes.   Patient Reports nocturnal pain.  Difficulty dressing/grooming: Denies Difficulty climbing stairs: Denies Difficulty getting out of chair: Denies Difficulty using hands for taps, buttons, cutlery, and/or writing: Reports  Review of Systems  Constitutional: Negative for fatigue, night sweats, weight gain and weight loss.  HENT: Positive for mouth dryness and nose dryness. Negative for mouth sores, trouble swallowing and trouble swallowing.   Eyes: Positive for dryness. Negative for pain, redness and visual disturbance.  Respiratory: Negative for cough, shortness of breath and difficulty breathing.   Cardiovascular: Negative for chest pain, palpitations, hypertension, irregular heartbeat and swelling in legs/feet.  Gastrointestinal: Positive for constipation. Negative for blood in stool and diarrhea.  Endocrine: Negative for increased urination.  Genitourinary: Negative for difficulty urinating and vaginal dryness.  Musculoskeletal: Positive for morning stiffness. Negative for arthralgias, joint pain, joint swelling, myalgias, muscle  weakness, muscle tenderness and myalgias.  Skin: Positive for color change. Negative for rash, hair loss, redness, skin tightness, ulcers and sensitivity to sunlight.  Allergic/Immunologic: Negative for susceptible to infections.  Neurological: Positive for numbness. Negative for dizziness, headaches, memory loss, night sweats and weakness.  Hematological: Negative for bruising/bleeding tendency and swollen glands.  Psychiatric/Behavioral: Negative for depressed mood, confusion and sleep disturbance. The patient is not nervous/anxious.     PMFS History:  Patient Active Problem List   Diagnosis Date Noted  . Language difficulty 08/17/2018  . Nonintractable headache 08/17/2018  . Upper airway cough syndrome 10/13/2017  . Pneumomediastinum (HCC)   . Pneumoperitoneum   . Acute respiratory failure (HCC)   . Pneumothorax, traumatic   . Anaphylaxis 08/17/2017  . Trochanteric bursitis of both hips 03/16/2017  . Rheumatoid arthritis involving multiple sites with positive rheumatoid factor (HCC) 10/09/2016  . Sicca syndrome, unspecified (HCC) 10/09/2016  . Osteopenia of multiple sites 10/09/2016  . History of scoliosis 10/09/2016  . History of gastroesophageal reflux (GERD) 10/09/2016  . History of hypothyroidism 10/09/2016  . History of hyperlipidemia 10/09/2016  . High risk medication use 10/09/2016  . Primary osteoarthritis of both hands 10/09/2016  . Paresthesia 10/11/2014  . Hyperlipidemia 10/11/2014  . Small vessel disease, 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/11/2014  . Tinnitus 09/17/2014  . Abdominal pain 09/05/2014  . Acute cystitis 09/05/2014  . Arthritis 09/05/2014  . Cough 09/05/2014  . Esophagitis 09/05/2014  . Facial numbness 09/05/2014  . Difficulty hearing 09/05/2014  . H/O disease 09/05/2014  . HLD (hyperlipidemia) 09/05/2014  . Muscle ache 09/05/2014  . Symptoms involving urinary system 09/05/2014  . Awareness of heartbeats 09/05/2014  . Acne erythematosa 09/05/2014  . Absence of  bladder  continence 09/05/2014  . Urgency of micturation 09/05/2014  . Routine general medical examination at a health care facility 09/05/2014  . DYSPHAGIA 01/09/2009  . HYPOTHYROIDISM 11/04/2007  . DYSLIPIDEMIA 11/04/2007  . GERD 11/04/2007  . 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/04/2007  . DUODENITIS, WITHOUT HEMORRHAGE 11/04/2007  . HIATAL HERNIA 11/04/2007  . DIVERTICULOSIS, COLON 11/04/2007  . CONSTIPATION, CHRONIC 11/04/2007  . RECTAL BLEEDING 11/04/2007    Past Medical History:  Diagnosis Date  . Barrett esophagus   . Diverticulosis   . Dyslipidemia   . Facial numbness   . GERD (gastroesophageal reflux disease)   . Hearing loss   . Hiatal hernia   . Hypothyroidism   . Rheumatoid arteritis (HCC)     Family History  Problem Relation Age of Onset  . Heart disease Mother   . Rheumatic fever Mother   . Heart failure Mother   . Stroke Father   . Diabetes Father   . Heart failure Father   . Heart disease Maternal Aunt        aunt x 2  . Irritable bowel syndrome Son   . GER disease Son   . Colon cancer Neg Hx    Past Surgical History:  Procedure Laterality Date  . CATARACT EXTRACTION, BILATERAL     04/13/2019, 04/27/2019  . CHEST TUBE INSERTION  08/17/2017  . COLONOSCOPY  08/17/2017  . HAMMER TOE SURGERY Left   . INTRAUTERINE DEVICE INSERTION    . IUD REMOVAL    . KNEE ARTHROPLASTY    . KNEE ARTHROSCOPY  12/25/11   left  . LEFT OOPHORECTOMY  03/13/98  . MANDIBLE SURGERY  9/93   pallete expansion  . TONSILLECTOMY    . TUBAL LIGATION     Social History   Social History Narrative   Live with husband at home.   Left-handed.   1-2 cups per day.   Immunization History  Administered Date(s) Administered  . Influenza, High Dose Seasonal PF 04/29/2016, 05/03/2017  . Influenza-Unspecified 05/21/2010, 04/28/2013, 05/17/2014, 04/30/2015, 05/03/2017, 05/10/2018  . PFIZER SARS-COV-2 Vaccination 09/02/2019, 09/23/2019  . Pneumococcal Conjugate-13 03/29/2014  . Pneumococcal  Polysaccharide-23 05/21/2010, 09/05/2018  . Zoster 10/19/2007  . Zoster Recombinat (Shingrix) 12/10/2017, 04/08/2018     Objective: Vital Signs: BP 94/61 (BP Location: Left Arm, Patient Position: Sitting, Cuff Size: Normal)   Pulse 71   Resp 13   Ht 5' 1.5" (1.562 m)   Wt 113 lb 12.8 oz (51.6 kg)   BMI 21.15 kg/m    Physical Exam Vitals and nursing note reviewed.  Constitutional:      Appearance: She is well-developed.  HENT:     Head: Normocephalic and atraumatic.  Eyes:     Conjunctiva/sclera: Conjunctivae normal.  Cardiovascular:     Rate and Rhythm: Normal rate and regular rhythm.     Heart sounds: Normal heart sounds.  Pulmonary:     Effort: Pulmonary effort is normal.     Breath sounds: Normal breath sounds.  Abdominal:     General: Bowel sounds are normal.     Palpations: Abdomen is soft.  Musculoskeletal:     Cervical back: Normal range of motion.  Lymphadenopathy:     Cervical: No cervical adenopathy.  Skin:    General: Skin is warm and dry.     Capillary Refill: Capillary refill takes 2 to 3 seconds.  Neurological:     Mental Status: She is alert and oriented to person, place, and time.  Psychiatric:  Behavior: Behavior normal.      Musculoskeletal Exam: C-spine thoracic and lumbar spine with good range of motion. Shoulder joints, elbow joints, wrist joints with good range of motion. She had no synovitis. She has PIP and DIP thickening consistent with osteoarthritis. Hip joints, knee joints, ankles and MTPs and PIPs with good range of motion.  CDAI Exam: CDAI Score: 0.4  Patient Global: 2 mm; Provider Global: 2 mm Swollen: 0 ; Tender: 0  Joint Exam 12/14/2019   No joint exam has been documented for this visit   There is currently no information documented on the homunculus. Go to the Rheumatology activity and complete the homunculus joint exam.  Investigation: No additional findings.  Imaging: No results found.  Recent Labs: Lab Results    Component Value Date   WBC 4.9 12/07/2019   HGB 12.5 12/07/2019   PLT 211 12/07/2019   NA 140 12/07/2019   K 4.5 12/07/2019   CL 103 12/07/2019   CO2 22 12/07/2019   GLUCOSE 80 12/07/2019   BUN 24 12/07/2019   CREATININE 0.71 12/07/2019   BILITOT 0.2 12/07/2019   ALKPHOS 84 12/07/2019   AST 17 12/07/2019   ALT 16 12/07/2019   PROT 6.6 12/07/2019   ALBUMIN 4.4 12/07/2019   CALCIUM 9.3 12/07/2019   GFRAA 98 12/07/2019  December 07, 2019 ANA positive, anti-SSA antibody positive, (SSB negative, dsDNA negative, RNP negative, Smith negative, SCL 70 -), C3-C4 normal, anticardiolipin negative, beta-2 GP 1 -, lupus anticoagulant negative, pan-ANCA negative, MPO negative, proteinase 3 is negative, Jo 1 -, centromere negative, cryoglobulins negative  Speciality Comments: PLQ eye exam: 07/31/2019 WNL @ [facility] Follow up in 1 year  Procedures:  No procedures performed Allergies: Chlorpheniramine, Ciprofloxacin, Terbinafine, and Lamisil at [b.f.i.]   Assessment / Plan:     Visit Diagnoses: Rheumatoid arthritis involving multiple sites with positive rheumatoid factor (HCC)-her rheumatoid arthritis is well controlled without any synovitis. She has been tolerating Plaquenil well.  High risk medication use - Plaquenil 200 mg 1 tablet by mouth daily. Eye exam: 07/31/2019. Her labs have been stable. We will check labs in August again.  Sjogren's syndrome with other organ involvement [facility]) -she has history of sicca symptoms for many years. We did labs recently because of Raynaud's phenomenon. Her ANA is positive but titer is not available. We will check ANA titer with her next labs. She also has anti-Ro antibody. Her symptoms are manageable with over-the-counter products.  Raynaud's syndrome without gangrene -patient believes that she has had cold and red hands on her life. Recently she had one finger which turned white and she was concerned about it. We did extensive labs recently which  were all negative. She has no features of sclerodactyly or nailbed capillary changes.  Primary osteoarthritis of both hands-she is osteoarthritis in her hands which causes discomfort.  Spinal stenosis of cervical region-she has limited range of motion.  Osteopenia of multiple sites-she is on calcium and vitamin D.  The medical problems are listed as follows:  History of hyperlipidemia  History of hypothyroidism  History of scoliosis  History of gastroesophageal reflux (GERD)  History of diverticulitis  Orders: Orders Placed This Encounter  Procedures  . CBC with Differential/Platelet  . Serum protein electrophoresis with reflex  . ANA  . COMPLETE METABOLIC PANEL WITH GFR   No orders of the defined types were placed in this encounter.    Follow-Up Instructions: Return in about 3 months (around 03/15/2020) for Rheumatoid arthritis, Sjogren's, Osteoarthritis.  Bo Merino, MD  Note - This record has been created using Editor, commissioning.  Chart creation errors have been sought, but may not always  have been located. Such creation errors do not reflect on  the standard of medical care.

## 2019-12-13 ENCOUNTER — Ambulatory Visit (INDEPENDENT_AMBULATORY_CARE_PROVIDER_SITE_OTHER): Payer: Medicare Other | Admitting: Podiatry

## 2019-12-13 ENCOUNTER — Encounter: Payer: Self-pay | Admitting: Podiatry

## 2019-12-13 ENCOUNTER — Other Ambulatory Visit: Payer: Self-pay

## 2019-12-13 DIAGNOSIS — M21612 Bunion of left foot: Secondary | ICD-10-CM

## 2019-12-13 DIAGNOSIS — Z9889 Other specified postprocedural states: Secondary | ICD-10-CM

## 2019-12-13 DIAGNOSIS — M2042 Other hammer toe(s) (acquired), left foot: Secondary | ICD-10-CM

## 2019-12-13 NOTE — Progress Notes (Signed)
We will discuss lab work at follow up visit on 12/14/19.

## 2019-12-14 ENCOUNTER — Encounter: Payer: Self-pay | Admitting: Rheumatology

## 2019-12-14 ENCOUNTER — Ambulatory Visit: Payer: Medicare Other | Admitting: Rheumatology

## 2019-12-14 VITALS — BP 94/61 | HR 71 | Resp 13 | Ht 61.5 in | Wt 113.8 lb

## 2019-12-14 DIAGNOSIS — M3509 Sicca syndrome with other organ involvement: Secondary | ICD-10-CM | POA: Diagnosis not present

## 2019-12-14 DIAGNOSIS — M8589 Other specified disorders of bone density and structure, multiple sites: Secondary | ICD-10-CM

## 2019-12-14 DIAGNOSIS — I7300 Raynaud's syndrome without gangrene: Secondary | ICD-10-CM | POA: Diagnosis not present

## 2019-12-14 DIAGNOSIS — Z79899 Other long term (current) drug therapy: Secondary | ICD-10-CM

## 2019-12-14 DIAGNOSIS — Z8639 Personal history of other endocrine, nutritional and metabolic disease: Secondary | ICD-10-CM

## 2019-12-14 DIAGNOSIS — M0579 Rheumatoid arthritis with rheumatoid factor of multiple sites without organ or systems involvement: Secondary | ICD-10-CM | POA: Diagnosis not present

## 2019-12-14 DIAGNOSIS — Z8739 Personal history of other diseases of the musculoskeletal system and connective tissue: Secondary | ICD-10-CM

## 2019-12-14 DIAGNOSIS — M19041 Primary osteoarthritis, right hand: Secondary | ICD-10-CM

## 2019-12-14 DIAGNOSIS — M3500 Sicca syndrome, unspecified: Secondary | ICD-10-CM

## 2019-12-14 DIAGNOSIS — M4802 Spinal stenosis, cervical region: Secondary | ICD-10-CM

## 2019-12-14 DIAGNOSIS — M7061 Trochanteric bursitis, right hip: Secondary | ICD-10-CM

## 2019-12-14 DIAGNOSIS — M19042 Primary osteoarthritis, left hand: Secondary | ICD-10-CM

## 2019-12-14 DIAGNOSIS — Z8719 Personal history of other diseases of the digestive system: Secondary | ICD-10-CM

## 2019-12-14 LAB — CBC WITH DIFFERENTIAL/PLATELET
Basos: 1 %
EOS (ABSOLUTE): 0.2 10*3/uL (ref 0.0–0.4)
Eos: 4 %
Hemoglobin: 12.5 g/dL (ref 11.1–15.9)
Immature Grans (Abs): 0 10*3/uL (ref 0.0–0.1)
Immature Granulocytes: 0 %
Lymphocytes Absolute: 1.4 10*3/uL (ref 0.7–3.1)
Lymphs: 28 %
MCH: 31.2 pg (ref 26.6–33.0)
MCHC: 32.6 g/dL (ref 31.5–35.7)
MCV: 96 fL (ref 79–97)
Monocytes Absolute: 0.4 10*3/uL (ref 0.1–0.9)
Monocytes: 8 %
Neutrophils Absolute: 2.9 10*3/uL (ref 1.4–7.0)
Neutrophils: 59 %
Platelets: 211 10*3/uL (ref 150–450)
RBC: 4.01 x10E6/uL (ref 3.77–5.28)
RDW: 11.5 % — ABNORMAL LOW (ref 11.7–15.4)

## 2019-12-14 LAB — PAN-ANCA
ANCA Proteinase 3: <3.5 U/mL (ref 0.0–3.5)
Atypical pANCA: <1:20 {titer}
C-ANCA: <1:20 {titer}
Myeloperoxidase Ab: <9 U/mL (ref 0.0–9.0)
P-ANCA: <1:20 {titer}

## 2019-12-14 LAB — CARDIOLIPIN ANTIBODY, IGA: Anticardiolipin IgA: <9 APL U/mL (ref 0–11)

## 2019-12-14 LAB — C3 COMPLEMENT: Complement C3, Serum: 118 mg/dL (ref 82–167)

## 2019-12-14 LAB — CMP14+EGFR
ALT: 16 IU/L (ref 0–32)
AST: 17 IU/L (ref 0–40)
Albumin/Globulin Ratio: 2 (ref 1.2–2.2)
Albumin: 4.4 g/dL (ref 3.7–4.7)
Alkaline Phosphatase: 84 IU/L (ref 39–117)
BUN/Creatinine Ratio: 34 — ABNORMAL HIGH (ref 12–28)
BUN: 24 mg/dL (ref 8–27)
CO2: 22 mmol/L (ref 20–29)
Calcium: 9.3 mg/dL (ref 8.7–10.3)
Chloride: 103 mmol/L (ref 96–106)
Creatinine, Ser: 0.71 mg/dL (ref 0.57–1.00)
GFR calc Af Amer: 98 mL/min/{1.73_m2} (ref >59)
GFR calc non Af Amer: 85 mL/min/{1.73_m2} (ref >59)
Globulin, Total: 2.2 g/dL (ref 1.5–4.5)
Glucose: 80 mg/dL (ref 65–99)
Potassium: 4.5 mmol/L (ref 3.5–5.2)
Sodium: 140 mmol/L (ref 134–144)
Total Protein: 6.6 g/dL (ref 6.0–8.5)

## 2019-12-14 LAB — LUPUS ANTICOAGULANT PANEL: Dilute Viper Venom Time: 31 s (ref 0.0–47.0)

## 2019-12-14 LAB — BETA-2-GLYCOPROTEIN I ABS, IGG/M/A
Beta-2 Glyco 1 IgA: <9 GPI IgA units (ref 0–25)
Beta-2 Glyco 1 IgM: <9 GPI IgM units (ref 0–32)

## 2019-12-14 LAB — ANA W/REFLEX IF POSITIVE
Anti JO-1: <0.2 AI (ref 0.0–0.9)
Anti Nuclear Antibody (ANA): POSITIVE — AB
Centromere Ab Screen: <0.2 AI (ref 0.0–0.9)
Chromatin Ab SerPl-aCnc: <0.2 AI (ref 0.0–0.9)

## 2019-12-14 LAB — ANTI-DNA ANTIBODY, DOUBLE-STRANDED: dsDNA Ab: 1 IU/mL (ref 0–9)

## 2019-12-14 LAB — CRYOGLOBULIN

## 2019-12-14 LAB — SJOGRENS SYNDROME-B EXTRACTABLE NUCLEAR ANTIBODY: ENA SSB (LA) Ab: <0.2 AI (ref 0.0–0.9)

## 2019-12-14 LAB — ANTI-SMITH ANTIBODY: ENA SM Ab Ser-aCnc: <0.2 AI (ref 0.0–0.9)

## 2019-12-14 LAB — SJOGRENS SYNDROME-A EXTRACTABLE NUCLEAR ANTIBODY: ENA SSA (RO) Ab: 3.2 AI — ABNORMAL HIGH (ref 0.0–0.9)

## 2019-12-14 LAB — RNP ANTIBODIES: ENA RNP Ab: 0.4 AI (ref 0.0–0.9)

## 2019-12-14 LAB — C4 COMPLEMENT: Complement C4, Serum: 27 mg/dL (ref 12–38)

## 2019-12-14 LAB — ANTI-SCLERODERMA ANTIBODY: Scleroderma (Scl-70) (ENA) Antibody, IgG: <0.2 AI (ref 0.0–0.9)

## 2019-12-14 NOTE — Patient Instructions (Signed)
Standing Labs We placed an order today for your standing lab work.    Please come back and get your standing labs in August  We have open lab daily Monday through Thursday from 8:30-12:30 PM and 1:30-4:30 PM and Friday from 8:30-12:30 PM and 1:30-4:00 PM at the office of Dr. Negin Hegg.   You may experience shorter wait times on Monday and Friday afternoons. The office is located at [address], [address], [place], [place] [address] No appointment is necessary.   Labs are drawn by [facility].  You may receive a bill from [facility] for your lab work.  If you wish to have your labs drawn at another location, please call the office 24 hours in advance to send orders.  If you have any questions regarding directions or hours of operation,  please call [phone].   Just as a reminder please drink plenty of water prior to coming for your lab work. Thanks!   

## 2019-12-15 ENCOUNTER — Telehealth: Payer: Self-pay | Admitting: Rheumatology

## 2019-12-15 NOTE — Telephone Encounter (Signed)
Patient left a message today stating she returning a call in reference to her recent labs.

## 2019-12-15 NOTE — Telephone Encounter (Signed)
Patient states she spoke with her insurance company and they will allow her to have her labs done with [facility] now. Patient states she will come to the office for the ext set of lab work. Patient advised she will be due to have labs approximately 03/15/2020. Patient advised of lab hours.

## 2019-12-19 NOTE — Progress Notes (Signed)
   Subjective:  Patient presents today status post Akin bunionectomy and DIPJ 2nd digit left. DOS: 09/21/2019. She reports improvement and states she is doing well. She stopped using the CAM boot last week. Being on the foot for long periods of time or wearing tight shoes increases the pain. Resting the foot helps alleviate the symptoms. Patient is here for further evaluation and treatment.    Past Medical History:  Diagnosis Date  . Barrett esophagus   . Diverticulosis   . Dyslipidemia   . Facial numbness   . GERD (gastroesophageal reflux disease)   . Hearing loss   . Hiatal hernia   . Hypothyroidism   . Rheumatoid arteritis (HCC)       Objective/Physical Exam Neurovascular status intact.  Skin incisions appear to be well coapted. No sign of infectious process noted. No dehiscence. No active bleeding noted. Moderate edema noted to the surgical extremity.  Assessment: 1. s/p Akin bunionectomy and DIPJ 2nd digit left. DOS: 09/21/2019   Plan of Care:  1. Patient was evaluated. 2. May resume full activity with no restrictions.  3. Recommended good shoe gear.  4. Return to clinic as needed.    Felecia Shelling, DPM [facility]  Dr. Felecia Shelling, DPM    [address]                                        [place], [place] [address]                Office [phone]  Fax [phone]

## 2020-01-08 IMAGING — DX DG CHEST 1V PORT
1 series · 1 of 1 positions shown · non-contrast
Comparison: 04/23/2014

CLINICAL DATA: Endotracheal and gastric tube placement

EXAM:
PORTABLE CHEST 1 VIEW

[chest ap]
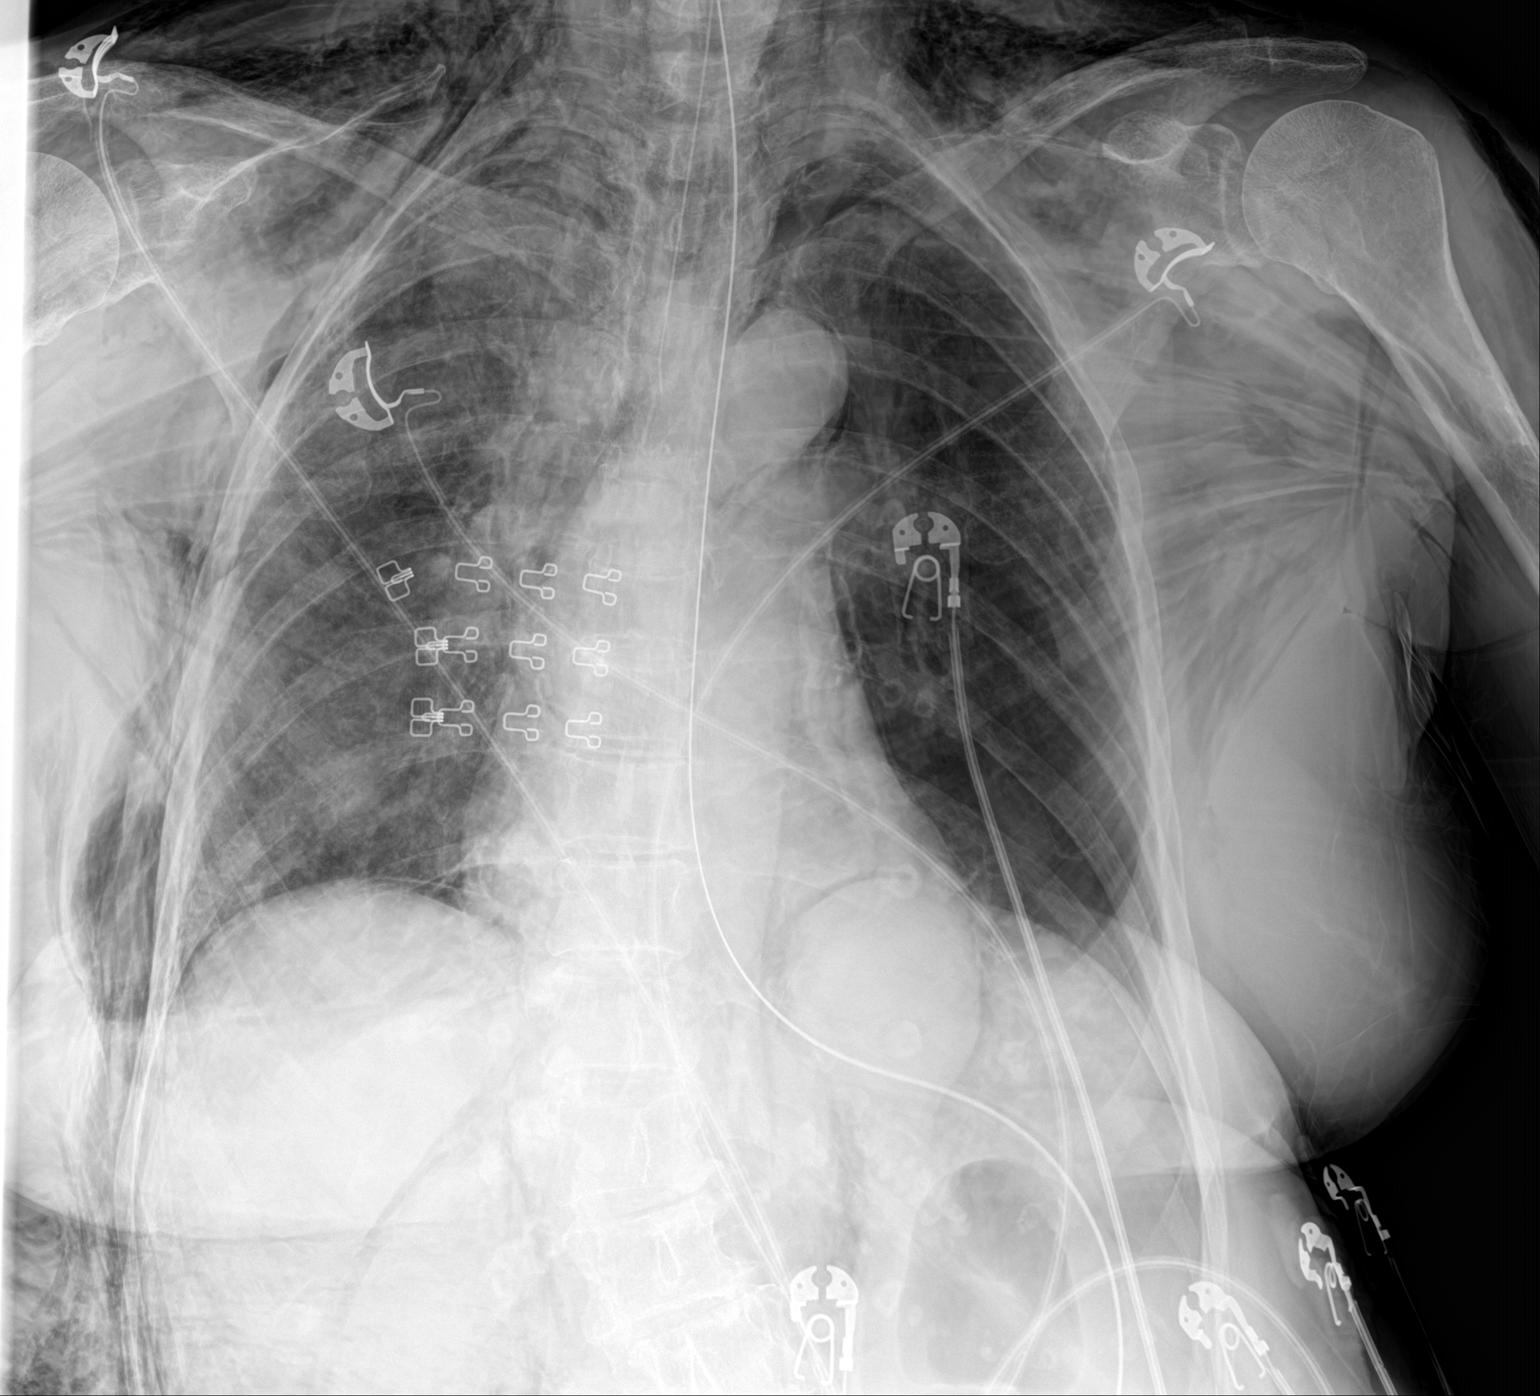

[1 of 1 positions shown; findings below may reference images not displayed]

FINDINGS: Extensive subcutaneous emphysema is seen at the base of the neck and
overlying the thorax as well as upper abdomen. The tip of an
endotracheal tube is 1.7 cm above the carina. A gastric tube extends
into the expected location of the stomach however the tip and side
port are excluded on the current exam.

Heart is normal in size. There is aortic atherosclerosis.
Pneumomediastinum outlining the aortic arch and left heart border as
well as base of heart is seen. Crescentic lucencies are seen at the
lung bases that may either be related to the pneumomediastinum
although the possibility of small amount of free air is not excluded
as well.

Dextroconvex curvature is stable at the thoracolumbar junction.
IMPRESSION: 1. Low lying endotracheal tube approximately 1.7 cm above the
carina. Pullback at least 2 cm suggested.
2. Gastric tube tip and side port are excluded on the current exam
but extends below the left hemidiaphragm.
3. Diffuse extensive subcutaneous emphysema with pneumomediastinum.
4. There are small crescentic lucencies at the base of the lungs
which in part likely represents continuation of the
pneumomediastinum although the possibility of a tiny amount of free
intraperitoneal air beneath the diaphragm is not excluded,
especially on the right.

## 2020-01-10 IMAGING — DX DG CHEST 1V
1 series · 1 of 1 positions shown · non-contrast
Comparison: Portable chest x-ray of today's date at [DATE] a.m..

CLINICAL DATA: The patient has undergone removal of bilateral chest
tubes.

EXAM:
CHEST 1 VIEW

[chest ap]
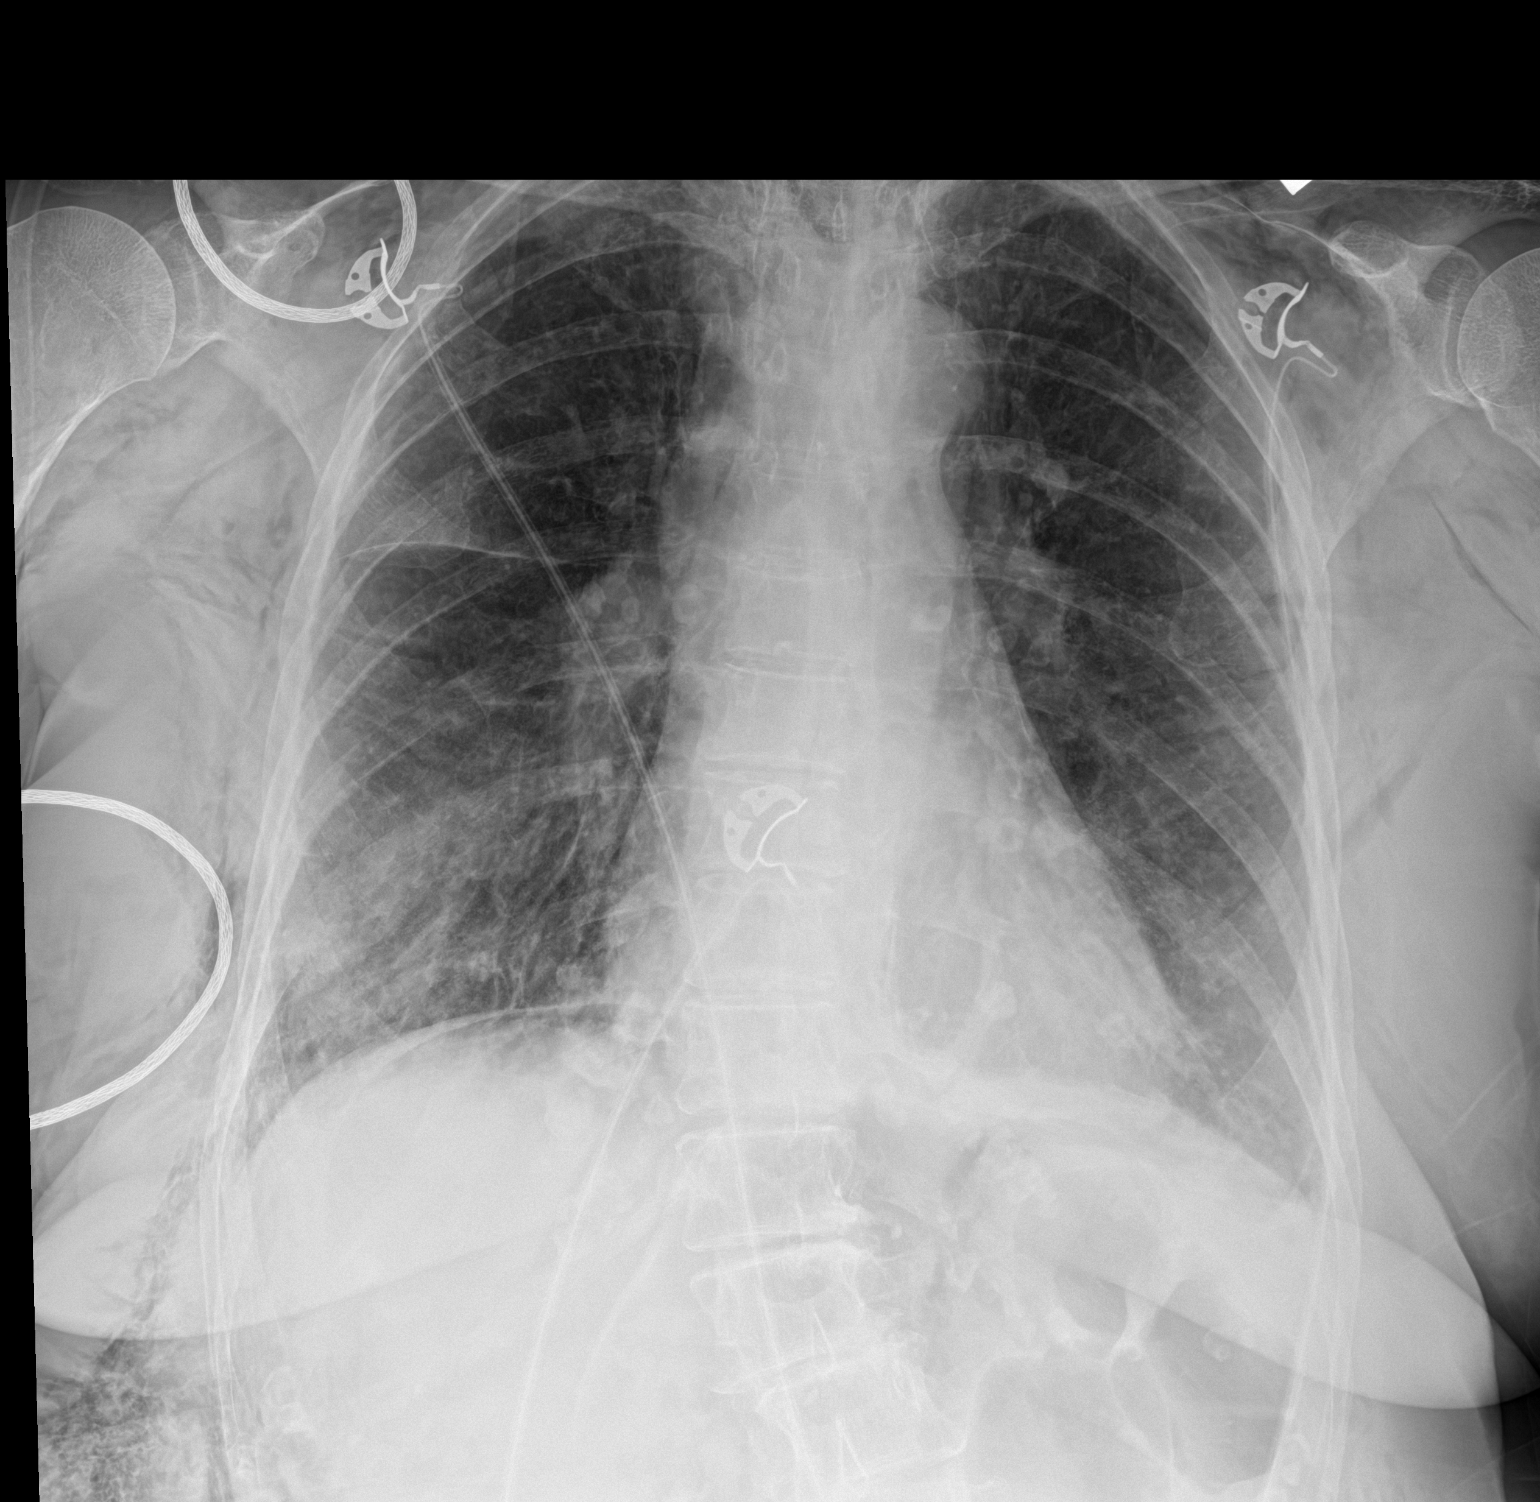

[1 of 1 positions shown; findings below may reference images not displayed]

FINDINGS: The small caliber chest tubes have been removed. There is no
pneumothorax. There is a small amount of pneumomediastinum on the
right. This is stable. There is considerable subcutaneous emphysema
bilaterally. The heart and pulmonary vascularity are normal. There
is no pleural effusion.
IMPRESSION: No pneumothorax following chest tube removal. Persistent mild
pneumomediastinum on the right.

## 2020-01-10 IMAGING — DX DG CHEST 1V PORT
1 series · 1 of 1 positions shown · non-contrast
Comparison: 08/18/2017

CLINICAL DATA: Endotracheal tube

EXAM:
PORTABLE CHEST 1 VIEW

[chest ap]
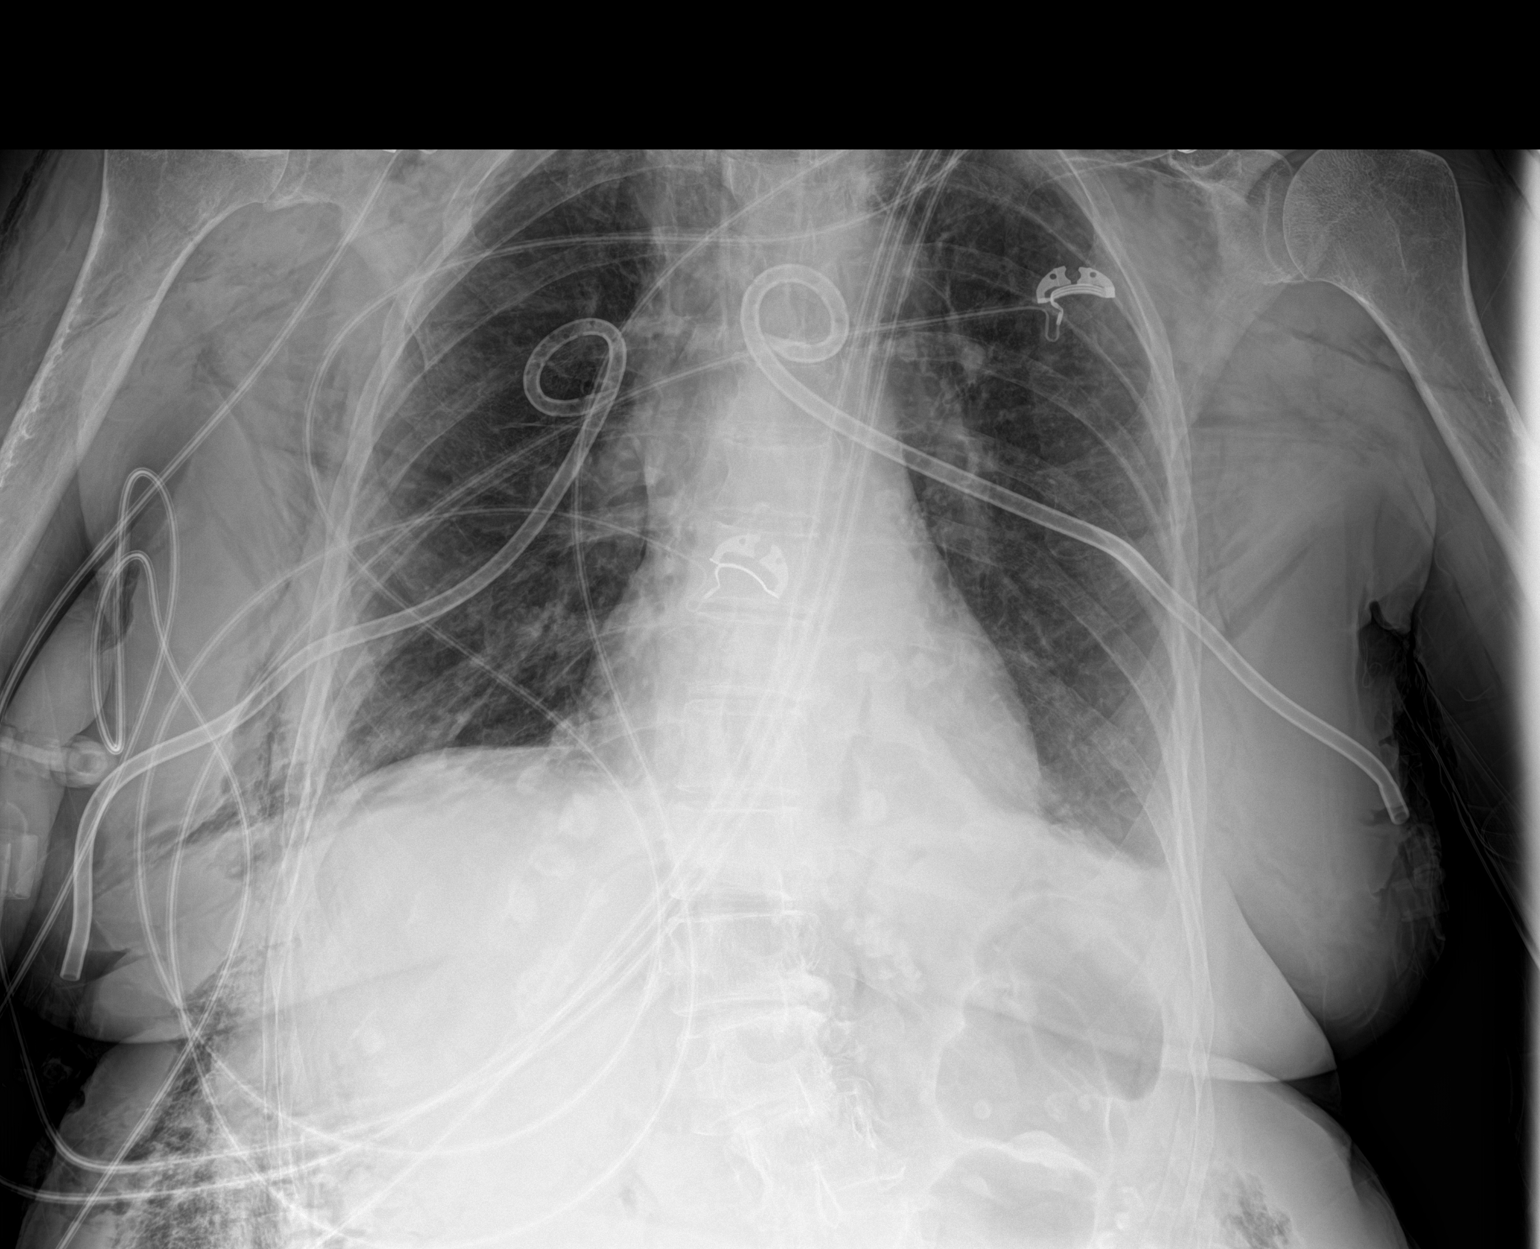

[1 of 1 positions shown; findings below may reference images not displayed]

FINDINGS: Endotracheal tube no longer visualized.  NG tube removed.

Bilateral pigtail chest tubes are present unchanged in position.
Negative for pneumothorax. Moderate subcutaneous emphysema
bilaterally has improved.

Mild bibasilar atelectasis left greater than right is unchanged.
Small left effusion unchanged. Cardiac and mediastinal contours
normal
IMPRESSION: Endotracheal tube removed.

Bilateral chest tubes in place without pneumothorax. Improvement in
subcutaneous emphysema.

## 2020-01-28 ENCOUNTER — Other Ambulatory Visit: Payer: Self-pay | Admitting: Rheumatology

## 2020-01-28 DIAGNOSIS — M0579 Rheumatoid arthritis with rheumatoid factor of multiple sites without organ or systems involvement: Secondary | ICD-10-CM

## 2020-01-29 NOTE — Telephone Encounter (Signed)
Last Visit: 12/14/2019 Next Visit: 03/21/2020 Labs: 12/07/2019 BUN/Creatinine Ratio 34, RDW 11.5 PLQ eye exam: 07/31/2019 WNL  Okay to refill PLQ?

## 2020-02-22 ENCOUNTER — Other Ambulatory Visit: Payer: Self-pay | Admitting: Obstetrics & Gynecology

## 2020-02-22 DIAGNOSIS — Z1231 Encounter for screening mammogram for malignant neoplasm of breast: Secondary | ICD-10-CM

## 2020-03-06 ENCOUNTER — Ambulatory Visit: Payer: Medicare Other | Admitting: Physician Assistant

## 2020-03-21 ENCOUNTER — Ambulatory Visit: Payer: Medicare Other | Admitting: Rheumatology

## 2020-03-21 NOTE — Progress Notes (Signed)
Office Visit Note  Patient: Linda Berg             Date of Birth: 10/29/1945           MRN: [id number]             PCP: Richmond Campbell., PA-C Referring: Richmond Campbell., PA-C Visit Date: 04/04/2020 Occupation: @GUAROCC @  Subjective:  Pain in both hands   History of Present Illness: [name] is a 74 y.o. female with history of seropositive rheumatoid arthritis, Sjogren's syndrome, and osteoarthritis. Patient is taking Plaquenil 200 mg 1 tablet by mouth daily. She has ongoing pain in both hands and both feet.  She denies any joint swelling.  She has been using aspercreme at bedtime for pain relief.  She has ongoing sicca symptoms.  She is using restasis and xylimelts for symptomatic relief.    Activities of Daily Living:  Patient reports joint stiffness all day Patient Reports nocturnal pain.  Difficulty dressing/grooming: Denies Difficulty climbing stairs: Denies Difficulty getting out of chair: Denies Difficulty using hands for taps, buttons, cutlery, and/or writing: reports   Review of Systems  Constitutional: Positive for fatigue.  HENT: Positive for mouth sores and mouth dryness. Negative for nose dryness.   Eyes: Positive for dryness. Negative for pain and visual disturbance.  Respiratory: Negative for cough, hemoptysis, shortness of breath and difficulty breathing.   Cardiovascular: Negative for chest pain, palpitations, hypertension and swelling in legs/feet.  Gastrointestinal: Positive for constipation. Negative for blood in stool and diarrhea.  Endocrine: Negative for increased urination.  Genitourinary: Negative for painful urination.  Musculoskeletal: Positive for arthralgias, joint pain and morning stiffness. Negative for joint swelling, myalgias, muscle weakness, muscle tenderness and myalgias.  Skin: Negative for color change, pallor, rash, hair loss, nodules/bumps, skin tightness, ulcers and sensitivity to sunlight.    Allergic/Immunologic: Negative for susceptible to infections.  Neurological: Negative for dizziness, numbness, headaches and weakness.  Hematological: Negative for swollen glands.  Psychiatric/Behavioral: Positive for sleep disturbance. Negative for depressed mood. The patient is not nervous/anxious.     PMFS History:  Patient Active Problem List   Diagnosis Date Noted  . Language difficulty 08/17/2018  . Nonintractable headache 08/17/2018  . Upper airway cough syndrome 10/13/2017  . Pneumomediastinum (HCC)   . Pneumoperitoneum   . Acute respiratory failure (HCC)   . Pneumothorax, traumatic   . Anaphylaxis 08/17/2017  . Trochanteric bursitis of both hips 03/16/2017  . Rheumatoid arthritis involving multiple sites with positive rheumatoid factor (HCC) 10/09/2016  . Sicca syndrome, unspecified (HCC) 10/09/2016  . Osteopenia of multiple sites 10/09/2016  . History of scoliosis 10/09/2016  . History of gastroesophageal reflux (GERD) 10/09/2016  . History of hypothyroidism 10/09/2016  . History of hyperlipidemia 10/09/2016  . High risk medication use 10/09/2016  . Primary osteoarthritis of both hands 10/09/2016  . Paresthesia 10/11/2014  . Hyperlipidemia 10/11/2014  . Small vessel disease, 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/11/2014  . Tinnitus 09/17/2014  . Abdominal pain 09/05/2014  . Acute cystitis 09/05/2014  . Arthritis 09/05/2014  . Cough 09/05/2014  . Esophagitis 09/05/2014  . Facial numbness 09/05/2014  . Difficulty hearing 09/05/2014  . H/O disease 09/05/2014  . HLD (hyperlipidemia) 09/05/2014  . Muscle ache 09/05/2014  . Symptoms involving urinary system 09/05/2014  . Awareness of heartbeats 09/05/2014  . Acne erythematosa 09/05/2014  . Absence of bladder continence 09/05/2014  . Urgency of micturation 09/05/2014  . Routine general medical examination at a health care facility 09/05/2014  .  DYSPHAGIA 01/09/2009  . HYPOTHYROIDISM 11/04/2007  . DYSLIPIDEMIA 11/04/2007  . GERD  11/04/2007  . 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/04/2007  . DUODENITIS, WITHOUT HEMORRHAGE 11/04/2007  . HIATAL HERNIA 11/04/2007  . DIVERTICULOSIS, COLON 11/04/2007  . CONSTIPATION, CHRONIC 11/04/2007  . RECTAL BLEEDING 11/04/2007    Past Medical History:  Diagnosis Date  . Barrett esophagus   . Diverticulosis   . Dyslipidemia   . Facial numbness   . GERD (gastroesophageal reflux disease)   . Hearing loss   . Hiatal hernia   . Hypothyroidism   . Rheumatoid arteritis (HCC)     Family History  Problem Relation Age of Onset  . Heart disease Mother   . Rheumatic fever Mother   . Heart failure Mother   . Stroke Father   . Diabetes Father   . Heart failure Father   . Heart disease Maternal Aunt        aunt x 2  . Irritable bowel syndrome Son   . GER disease Son   . Colon cancer Neg Hx    Past Surgical History:  Procedure Laterality Date  . CATARACT EXTRACTION, BILATERAL     04/13/2019, 04/27/2019  . CHEST TUBE INSERTION  08/17/2017  . COLONOSCOPY  08/17/2017  . HAMMER TOE SURGERY Left   . INTRAUTERINE DEVICE INSERTION    . IUD REMOVAL    . KNEE ARTHROPLASTY    . KNEE ARTHROSCOPY  12/25/11   left  . LEFT OOPHORECTOMY  03/13/98  . MANDIBLE SURGERY  9/93   pallete expansion  . TONSILLECTOMY    . TUBAL LIGATION     Social History   Social History Narrative   Live with husband at home.   Left-handed.   1-2 cups per day.   Immunization History  Administered Date(s) Administered  . Influenza, High Dose Seasonal PF 04/29/2016, 05/03/2017  . Influenza-Unspecified 05/21/2010, 04/28/2013, 05/17/2014, 04/30/2015, 05/03/2017, 05/10/2018  . PFIZER SARS-COV-2 Vaccination 09/02/2019, 09/23/2019  . Pneumococcal Conjugate-13 03/29/2014  . Pneumococcal Polysaccharide-23 05/21/2010, 09/05/2018  . Zoster 10/19/2007  . Zoster Recombinat (Shingrix) 12/10/2017, 04/08/2018     Objective: Vital Signs: BP 110/69 (BP Location: Left Arm, Patient Position: Sitting, Cuff Size: Small)   Pulse 66    Resp 12   Ht 5\' 1"  (1.549 m)   Wt 111 lb 12.8 oz (50.7 kg)   BMI 21.12 kg/m    Physical Exam Vitals and nursing note reviewed.  Constitutional:      Appearance: She is well-developed.  HENT:     Head: Normocephalic and atraumatic.  Eyes:     Conjunctiva/sclera: Conjunctivae normal.  Pulmonary:     Effort: Pulmonary effort is normal.  Abdominal:     Palpations: Abdomen is soft.  Musculoskeletal:     Cervical back: Normal range of motion.  Lymphadenopathy:     Cervical: No cervical adenopathy.  Skin:    General: Skin is warm and dry.     Capillary Refill: Capillary refill takes less than 2 seconds.  Neurological:     Mental Status: She is alert and oriented to person, place, and time.  Psychiatric:        Behavior: Behavior normal.      Musculoskeletal Exam: C-spine limited ROM.  Thoracic and lumbar spine good ROM.  No midline spinal tenderness.  No SI joint tenderness.  Shoulder joints, elbow joints, wrist joints, MCPs have good ROM with no discomfort.  PIP and DIP thickening consistent with osteoarthritis of both hands.  Hip joints good ROM with no  discomfort.  Knee joints good ROM with no warmth or effusion of knee joints.  No tenderness or inflammation of ankle joints.  Mild pedal edema noted bilaterally.   CDAI Exam: CDAI Score: 0.2  Patient Global: 1 mm; Provider Global: 1 mm Swollen: 0 ; Tender: 0  Joint Exam 04/04/2020   No joint exam has been documented for this visit   There is currently no information documented on the homunculus. Go to the Rheumatology activity and complete the homunculus joint exam.  Investigation: No additional findings.  Imaging: MM 3D SCREEN BREAST BILATERAL  Result Date: 03/28/2020 CLINICAL DATA:  Screening. EXAM: DIGITAL SCREENING BILATERAL MAMMOGRAM WITH TOMO AND CAD COMPARISON:  Previous exam(s). ACR Breast Density Category c: The breast tissue is heterogeneously dense, which may obscure small masses. FINDINGS: There are no findings  suspicious for malignancy. Images were processed with CAD. IMPRESSION: No mammographic evidence of malignancy. A result letter of this screening mammogram will be mailed directly to the patient. RECOMMENDATION: Screening mammogram in one year. (Code:SM-B-01Y) BI-RADS CATEGORY  1: Negative. Electronically Signed   By: Frederico Hamman M.D.   On: 03/28/2020 15:31   XR Hand 2 View Left  Result Date: 04/04/2020 Juxta-articular osteopenia was noted.  CMC, PIP and DIP narrowing was noted.  Third MCP narrowing was noted.  No intercarpal or radiocarpal joint space narrowing was noted.  No erosive changes were noted. Impression: These findings are consistent with rheumatoid arthritis and osteoarthritis overlap.  XR Hand 2 View Right  Result Date: 04/04/2020 PIP and DIP narrowing was noted.  Subluxation of fourth DIP joint was noted.  Narrowing of third MCP joint was noted.  No intercarpal radiocarpal joint space narrowing was noted.  No erosive changes were noted.  Juxta-articular osteopenia was noted. Impression: These findings are consistent with rheumatoid arthritis and osteoarthritis overlap.   Recent Labs: Lab Results  Component Value Date   WBC 3.9 03/27/2020   HGB 13.2 03/27/2020   PLT 226 03/27/2020   NA 141 03/27/2020   K 4.6 03/27/2020   CL 102 03/27/2020   CO2 33 (H) 03/27/2020   GLUCOSE 89 03/27/2020   BUN 26 (H) 03/27/2020   CREATININE 0.80 03/27/2020   BILITOT 0.3 03/27/2020   ALKPHOS 84 12/07/2019   AST 18 03/27/2020   ALT 18 03/27/2020   PROT 6.6 03/27/2020   PROT 6.7 03/27/2020   ALBUMIN 4.4 12/07/2019   CALCIUM 9.1 03/27/2020   GFRAA 85 03/27/2020    Speciality Comments: PLQ eye exam: 07/31/2019 WNL @ [facility] Follow up in 1 year  Procedures:  No procedures performed Allergies: Chlorpheniramine, Ciprofloxacin, Terbinafine, and Lamisil at [b.f.i.]   Assessment / Plan:     Visit Diagnoses: Rheumatoid arthritis involving multiple sites with positive  rheumatoid factor (HCC): She has no tenderness or synovitis on exam.  She has not had any recent rheumatoid arthritis flares.  She is clinically doing well on Plaquenil 200 mg 1 tablet by mouth daily.  She experiences intermittent discomfort in both hands and both feet due to underlying osteoarthritis.  No tenderness or synovitis of MCP joints noted.  X-rays of both hands were obtained today.  She continues to be followed by Dr. Logan Bores and has an upcoming appointment on 04/17/2020. She will continue taking Plaquenil 200 mg 1 tablet by mouth daily.  A refill of Plaquenil was sent to the pharmacy today.  She was advised to notify us if she develops increased joint pain or joint swelling.  She will follow-up  in the office in 5 months.  High risk medication use - Plaquenil 200 mg 1 tablet by mouth daily. Eye exam: 07/31/2019.  CBC and CMP were drawn on 03/27/2020 and were reviewed with the patient today in the office. She has received both COVID-19 vaccinations.  She was encouraged to receive the third dose.  She was advised to hold Tylenol and NSAIDs 24 hours prior to receiving the dose.  She is advised to notify us or her PCP if she develops a COVID-19 infection in order to receive the antibody infusion.  She was encouraged to continue to wear a mask and social distance.  Sjogren's syndrome with other organ involvement (HCC) - Repeat ANA negative (03/27/20), Ro+, La-: She has chronic sicca symptoms.  She uses Restasis eyedrops and XyliMelts for symptomatic relief.  Raynaud's syndrome without gangrene: Not currently active.  No digital ulcerations or signs of gangrene noted.  She is taking aspirin 81 mg 1 tablet daily.   Primary osteoarthritis of both hands: She has PIP and DIP thickening consistent with osteoarthritis of both hands.  She has subluxation of several DIP joints.  She experiences chronic pain and stiffness in both hands due to underlying osteoarthritis.  No inflammation was noted on exam today.  We  discussed the importance of joint protection and muscle strengthening.  She was given a handout of hand exercises to perform.  She plans on continuing to use Aspercreme topically as needed for pain relief.  Bilateral hand pain - She has been experiencing increased discomfort and stiffness in both hands.  She has PIP and DIP thickening consistent with osteoarthritis of both hands.  No tenderness or synovitis of MCP joints noted.  She is able to make a complete fist bilaterally.  X-rays of both hands were obtained today plan: XR Hand 2 View Right, XR Hand 2 View Left  Spinal stenosis of cervical region: She has limited range of motion especially with lateral rotation.  She was encouraged to perform neck exercises on a daily basis.  She was given a handout of exercises to perform.  Osteopenia of multiple sites: DEXA performed on 05/01/19: 1/3 right distal radius BMD 0.551 with T-score -2.4.  She is taking vitamin D 1000 units daily.  Other medical conditions are listed as follows:   History of hyperlipidemia  History of scoliosis  History of hypothyroidism  History of gastroesophageal reflux (GERD)  History of diverticulitis    Orders: Orders Placed This Encounter  Procedures  . XR Hand 2 View Right  . XR Hand 2 View Left   Meds ordered this encounter  Medications  . hydroxychloroquine (PLAQUENIL) 200 MG tablet    Sig: Take 1 tablet (200 mg total) by mouth daily.    Dispense:  90 tablet    Refill:  0    Face-to-face time spent with patient was 30 minutes. Greater than 50% of time was spent in counseling and coordination of care.  Follow-Up Instructions: Return in about 5 months (around 09/04/2020) for Rheumatoid arthritis, Sjogren's syndrome.   Sherron Ales, PA-C  I examined and evaluated the patient with Sherron Ales PA.  Patient had no synovitis on my examination.  She will continue Plaquenil as prescribed.  X-ray findings are reviewed with the patient.  The plan of care was  discussed as noted above.  Pollyann Savoy, MD  Note - This record has been created using Animal nutritionist.  Chart creation errors have been sought, but may not always  have been located. Such creation  errors do not reflect on  the standard of medical care.

## 2020-03-27 ENCOUNTER — Ambulatory Visit
Admission: RE | Admit: 2020-03-27 | Discharge: 2020-03-27 | Disposition: A | Discharge status: Home / Self Care | Payer: Medicare Other | Source: Ambulatory Visit | Attending: Obstetrics & Gynecology | Admitting: Obstetrics & Gynecology

## 2020-03-27 ENCOUNTER — Other Ambulatory Visit: Payer: Self-pay

## 2020-03-27 DIAGNOSIS — M3509 Sicca syndrome with other organ involvement: Secondary | ICD-10-CM

## 2020-03-27 DIAGNOSIS — Z1231 Encounter for screening mammogram for malignant neoplasm of breast: Secondary | ICD-10-CM

## 2020-03-27 DIAGNOSIS — I7300 Raynaud's syndrome without gangrene: Secondary | ICD-10-CM

## 2020-03-27 DIAGNOSIS — Z79899 Other long term (current) drug therapy: Secondary | ICD-10-CM

## 2020-03-27 DIAGNOSIS — M0579 Rheumatoid arthritis with rheumatoid factor of multiple sites without organ or systems involvement: Secondary | ICD-10-CM

## 2020-03-28 NOTE — Progress Notes (Signed)
CBC and CMP are normal.

## 2020-03-29 LAB — COMPLETE METABOLIC PANEL WITH GFR
AG Ratio: 1.8 (calc) (ref 1.0–2.5)
ALT: 18 U/L (ref 6–29)
AST: 18 U/L (ref 10–35)
Albumin: 4.2 g/dL (ref 3.6–5.1)
Alkaline phosphatase (APISO): 76 U/L (ref 37–153)
BUN/Creatinine Ratio: 33 (calc) — ABNORMAL HIGH (ref 6–22)
BUN: 26 mg/dL — ABNORMAL HIGH (ref 7–25)
CO2: 33 mmol/L — ABNORMAL HIGH (ref 20–32)
Calcium: 9.1 mg/dL (ref 8.6–10.4)
Chloride: 102 mmol/L (ref 98–110)
Creat: 0.8 mg/dL (ref 0.60–0.93)
GFR, Est African American: 85 mL/min/{1.73_m2} (ref >=60)
GFR, Est Non African American: 73 mL/min/{1.73_m2} (ref >=60)
Globulin: 2.4 g/dL (calc) (ref 1.9–3.7)
Glucose, Bld: 89 mg/dL (ref 65–99)
Potassium: 4.6 mmol/L (ref 3.5–5.3)
Sodium: 141 mmol/L (ref 135–146)
Total Bilirubin: 0.3 mg/dL (ref 0.2–1.2)
Total Protein: 6.6 g/dL (ref 6.1–8.1)

## 2020-03-29 LAB — PROTEIN ELECTROPHORESIS, SERUM, WITH REFLEX
Albumin ELP: 4.1 g/dL (ref 3.8–4.8)
Alpha 1: 0.3 g/dL (ref 0.2–0.3)
Alpha 2: 0.7 g/dL (ref 0.5–0.9)
Beta 2: 0.3 g/dL (ref 0.2–0.5)
Beta Globulin: 0.4 g/dL (ref 0.4–0.6)
Gamma Globulin: 0.9 g/dL (ref 0.8–1.7)
Total Protein: 6.7 g/dL (ref 6.1–8.1)

## 2020-03-29 LAB — CBC WITH DIFFERENTIAL/PLATELET
Absolute Monocytes: 386 cells/uL (ref 200–950)
Basophils Absolute: 31 cells/uL (ref 0–200)
Basophils Relative: 0.8 %
Eosinophils Absolute: 109 cells/uL (ref 15–500)
Eosinophils Relative: 2.8 %
HCT: 39.1 % (ref 35.0–45.0)
Hemoglobin: 13.2 g/dL (ref 11.7–15.5)
Lymphs Abs: 1439 cells/uL (ref 850–3900)
MCH: 32.5 pg (ref 27.0–33.0)
MCHC: 33.8 g/dL (ref 32.0–36.0)
MCV: 96.3 fL (ref 80.0–100.0)
MPV: 10.7 fL (ref 7.5–12.5)
Monocytes Relative: 9.9 %
Neutro Abs: 1934 cells/uL (ref 1500–7800)
Neutrophils Relative %: 49.6 %
Platelets: 226 10*3/uL (ref 140–400)
RBC: 4.06 10*6/uL (ref 3.80–5.10)
RDW: 12.1 % (ref 11.0–15.0)
Total Lymphocyte: 36.9 %
WBC: 3.9 10*3/uL (ref 3.8–10.8)

## 2020-03-29 LAB — ANA: Anti Nuclear Antibody (ANA): NEGATIVE

## 2020-03-29 NOTE — Progress Notes (Signed)
We will discuss results at the follow-up visit.

## 2020-04-04 ENCOUNTER — Ambulatory Visit: Payer: Medicare Other

## 2020-04-04 ENCOUNTER — Ambulatory Visit: Payer: Self-pay

## 2020-04-04 ENCOUNTER — Ambulatory Visit: Payer: Medicare Other | Admitting: Rheumatology

## 2020-04-04 ENCOUNTER — Encounter: Payer: Self-pay | Admitting: Rheumatology

## 2020-04-04 ENCOUNTER — Other Ambulatory Visit: Payer: Self-pay

## 2020-04-04 VITALS — BP 110/69 | HR 66 | Resp 12 | Ht 61.0 in | Wt 111.8 lb

## 2020-04-04 DIAGNOSIS — Z79899 Other long term (current) drug therapy: Secondary | ICD-10-CM | POA: Diagnosis not present

## 2020-04-04 DIAGNOSIS — M0579 Rheumatoid arthritis with rheumatoid factor of multiple sites without organ or systems involvement: Secondary | ICD-10-CM

## 2020-04-04 DIAGNOSIS — M19042 Primary osteoarthritis, left hand: Secondary | ICD-10-CM

## 2020-04-04 DIAGNOSIS — M8589 Other specified disorders of bone density and structure, multiple sites: Secondary | ICD-10-CM

## 2020-04-04 DIAGNOSIS — M79641 Pain in right hand: Secondary | ICD-10-CM

## 2020-04-04 DIAGNOSIS — M19041 Primary osteoarthritis, right hand: Secondary | ICD-10-CM

## 2020-04-04 DIAGNOSIS — Z8739 Personal history of other diseases of the musculoskeletal system and connective tissue: Secondary | ICD-10-CM

## 2020-04-04 DIAGNOSIS — M3509 Sicca syndrome with other organ involvement: Secondary | ICD-10-CM | POA: Diagnosis not present

## 2020-04-04 DIAGNOSIS — Z8719 Personal history of other diseases of the digestive system: Secondary | ICD-10-CM

## 2020-04-04 DIAGNOSIS — M79642 Pain in left hand: Secondary | ICD-10-CM

## 2020-04-04 DIAGNOSIS — I7300 Raynaud's syndrome without gangrene: Secondary | ICD-10-CM | POA: Diagnosis not present

## 2020-04-04 DIAGNOSIS — Z8639 Personal history of other endocrine, nutritional and metabolic disease: Secondary | ICD-10-CM

## 2020-04-04 DIAGNOSIS — M4802 Spinal stenosis, cervical region: Secondary | ICD-10-CM

## 2020-04-04 MED ORDER — HYDROXYCHLOROQUINE SULFATE 200 MG PO TABS: 200.0000 mg | ORAL_TABLET | Freq: Every day | ORAL | 0 refills | Status: DC

## 2020-04-04 NOTE — Patient Instructions (Addendum)
COVID-19 vaccine recommendations:   COVID-19 vaccine is recommended for everyone (unless you are allergic to a vaccine component), even if you are on a medication that suppresses your immune system.   If you are on Methotrexate, Cellcept (mycophenolate), Rinvoq, Harriette Ohara, and Olumiant- hold the medication for 1 week after each vaccine. Hold Methotrexate for 2 weeks after the single dose COVID-19 vaccine.   If you are on Orencia subcutaneous injection - hold medication one week prior to and one week after the first COVID-19 vaccine dose (only).   If you are on Orencia IV infusions- time vaccination administration so that the first COVID-19 vaccination will occur four weeks after the infusion and postpone the subsequent infusion by one week.   If you are on Cyclophosphamide or Rituxan infusions please contact your doctor prior to receiving the COVID-19 vaccine.   Do not take Tylenol or any anti-inflammatory medications (NSAIDs) 24 hours prior to the COVID-19 vaccination.   There is no direct evidence about the efficacy of the COVID-19 vaccine in individuals who are on medications that suppress the immune system.   Even if you are fully vaccinated, and you are on any medications that suppress your immune system, please continue to wear a mask, maintain at least six feet social distance and practice hand hygiene.   If you develop a COVID-19 infection, please contact your PCP or our office to determine if you need antibody infusion.  The booster vaccine is now available for immunocompromised patients. It is advised that if you had Pfizer vaccine you should get [employer] booster.  If you had a Moderna vaccine then you should get a Moderna booster. Johnson and Laural [employer] does not have a booster vaccine at this time.  Please see the following web sites for updated information.    https://[email or web address]/Portals/0/Files/COVID-19-Vaccination-Patient-Resources.pdf  https://[email or web address]/About-Us/[email or web address]-Releases/ID/1159  Standing Labs We placed an order today for your standing lab work.   Please have your standing labs drawn in January   If possible, please have your labs drawn 2 weeks prior to your appointment so that the provider can discuss your results at your appointment.  We have open lab daily Monday through Thursday from 8:30-12:30 PM and 1:30-4:30 PM and Friday from 8:30-12:30 PM and 1:30-4:00 PM at the office of Dr. Pollyann Savoy, [facility].   Please be advised, patients with office appointments requiring lab work will take precedents over walk-in lab work.  If possible, please come for your lab work on Monday and Friday afternoons, as you may experience shorter wait times. The office is located at [address], [place], [place] [address] No appointment is necessary.   Labs are drawn by [facility]. Please bring your co-pay at the time of your lab draw.  You may receive a bill from [facility] for your lab work.  If you wish to have your labs drawn at another location, please call the office 24 hours in advance to send orders.  If you have any questions regarding directions or hours of operation,  please call [phone].   As a reminder, please drink plenty of water prior to coming for your lab work. Thanks!   Hand Exercises Hand exercises can be helpful for almost anyone. These exercises can strengthen the hands, improve flexibility and movement, and increase blood flow to the hands. These results can make work and daily tasks easier. Hand exercises can be especially helpful for people who have joint pain from arthritis or have nerve damage from overuse (carpal tunnel syndrome). These exercises can  also help people who have injured a hand. Exercises Most of these hand exercises are gentle stretching and motion  exercises. It is usually safe to do them often throughout the day. Warming up your hands before exercise may help to reduce stiffness. You can do this with gentle massage or by placing your hands in warm water for 10-15 minutes. It is normal to feel some stretching, pulling, tightness, or mild discomfort as you begin new exercises. This will gradually improve. Stop an exercise right away if you feel sudden, severe pain or your pain gets worse. Ask your health care provider which exercises are best for you. Knuckle bend or "claw" fist 1. Stand or sit with your arm, hand, and all five fingers pointed straight up. Make sure to keep your wrist straight during the exercise. 2. Gently bend your fingers down toward your palm until the tips of your fingers are touching the top of your palm. Keep your big knuckle straight and just bend the small knuckles in your fingers. 3. Hold this position for __________ seconds. 4. Straighten (extend) your fingers back to the starting position. Repeat this exercise 5-10 times with each hand. Full finger fist 1. Stand or sit with your arm, hand, and all five fingers pointed straight up. Make sure to keep your wrist straight during the exercise. 2. Gently bend your fingers into your palm until the tips of your fingers are touching the middle of your palm. 3. Hold this position for __________ seconds. 4. Extend your fingers back to the starting position, stretching every joint fully. Repeat this exercise 5-10 times with each hand. Straight fist 1. Stand or sit with your arm, hand, and all five fingers pointed straight up. Make sure to keep your wrist straight during the exercise. 2. Gently bend your fingers at the big knuckle, where your fingers meet your hand, and the middle knuckle. Keep the knuckle at the tips of your fingers straight and try to touch the bottom of your palm. 3. Hold this position for __________ seconds. 4. Extend your fingers back to the starting  position, stretching every joint fully. Repeat this exercise 5-10 times with each hand. Tabletop 1. Stand or sit with your arm, hand, and all five fingers pointed straight up. Make sure to keep your wrist straight during the exercise. 2. Gently bend your fingers at the big knuckle, where your fingers meet your hand, as far down as you can while keeping the small knuckles in your fingers straight. Think of forming a tabletop with your fingers. 3. Hold this position for __________ seconds. 4. Extend your fingers back to the starting position, stretching every joint fully. Repeat this exercise 5-10 times with each hand. Finger spread 1. Place your hand flat on a table with your palm facing down. Make sure your wrist stays straight as you do this exercise. 2. Spread your fingers and thumb apart from each other as far as you can until you feel a gentle stretch. Hold this position for __________ seconds. 3. Bring your fingers and thumb tight together again. Hold this position for __________ seconds. Repeat this exercise 5-10 times with each hand. Making circles 1. Stand or sit with your arm, hand, and all five fingers pointed straight up. Make sure to keep your wrist straight during the exercise. 2. Make a circle by touching the tip of your thumb to the tip of your index finger. 3. Hold for __________ seconds. Then open your hand wide. 4. Repeat this motion with your  thumb and each finger on your hand. Repeat this exercise 5-10 times with each hand. Thumb motion 1. Sit with your forearm resting on a table and your wrist straight. Your thumb should be facing up toward the ceiling. Keep your fingers relaxed as you move your thumb. 2. Lift your thumb up as high as you can toward the ceiling. Hold for __________ seconds. 3. Bend your thumb across your palm as far as you can, reaching the tip of your thumb for the small finger (pinkie) side of your palm. Hold for __________ seconds. Repeat this exercise  5-10 times with each hand. Grip strengthening  1. Hold a stress ball or other soft ball in the middle of your hand. 2. Slowly increase the pressure, squeezing the ball as much as you can without causing pain. Think of bringing the tips of your fingers into the middle of your palm. All of your finger joints should bend when doing this exercise. 3. Hold your squeeze for __________ seconds, then relax. Repeat this exercise 5-10 times with each hand. Contact a health care provider if:  Your hand pain or discomfort gets much worse when you do an exercise.  Your hand pain or discomfort does not improve within 2 hours after you exercise. If you have any of these problems, stop doing these exercises right away. Do not do them again unless your health care provider says that you can. Get help right away if:  You develop sudden, severe hand pain or swelling. If this happens, stop doing these exercises right away. Do not do them again unless your health care provider says that you can. This information is not intended to replace advice given to you by your health care provider. Make sure you discuss any questions you have with your health care provider. Document Revised: 11/17/2018 Document Reviewed: 07/28/2018 Elsevier Patient Education  2020 [employer].

## 2020-04-17 ENCOUNTER — Other Ambulatory Visit: Payer: Self-pay

## 2020-04-17 ENCOUNTER — Ambulatory Visit: Payer: Medicare Other | Admitting: Podiatry

## 2020-04-17 DIAGNOSIS — R252 Cramp and spasm: Secondary | ICD-10-CM | POA: Diagnosis not present

## 2020-04-17 MED ORDER — CYCLOBENZAPRINE HCL 5 MG PO TABS: 5.0000 mg | ORAL_TABLET | Freq: Every day | ORAL | 1 refills | Status: DC

## 2020-04-17 NOTE — Progress Notes (Signed)
   HPI: 74 y.o. female presenting today for evaluation of nocturnal foot cramps that began to the patient's left foot.  Patient has a history of Akin bunionectomy with DIPJ arthroplasty of the second digit left foot.  DOS: 09/21/2019.  Patient states over the last few months she has began to get noticed nocturnal foot cramps.  She does not feel the cramping throughout the day.  She denies a history of injury.  She would like to have it evaluated today.  She has not done anything for treatment.  Past Medical History:  Diagnosis Date  . Barrett esophagus   . Diverticulosis   . Dyslipidemia   . Facial numbness   . GERD (gastroesophageal reflux disease)   . Hearing loss   . Hiatal hernia   . Hypothyroidism   . Rheumatoid arteritis (HCC)      Physical Exam: General: The patient is alert and oriented x3 in no acute distress.  Dermatology: Skin is warm, dry and supple bilateral lower extremities. Negative for open lesions or macerations.  Vascular: Palpable pedal pulses bilaterally. No edema or erythema noted. Capillary refill within normal limits.  Neurological: Epicritic and protective threshold grossly intact bilaterally.   Musculoskeletal Exam: Range of motion within normal limits to all pedal and ankle joints bilateral. Muscle strength 5/5 in all groups bilateral.   Assessment: 1. H/o Akin bunionectomy with DIPJ arthroplasty second digit left foot.  DOS: 09/21/2019 2.  Nocturnal foot cramps   Plan of Care:  1. Patient evaluated.  2.  Prescription for Flexeril 5 mg nightly 3.  Recommend good supportive tennis shoes 4.  Return to clinic as needed      Felecia Shelling, DPM [facility]  Dr. Felecia Shelling, DPM    [address] [place], [place] [address]                Office [phone]  Fax [phone]

## 2020-06-05 ENCOUNTER — Other Ambulatory Visit: Payer: Self-pay | Admitting: Physician Assistant

## 2020-06-05 DIAGNOSIS — M0579 Rheumatoid arthritis with rheumatoid factor of multiple sites without organ or systems involvement: Secondary | ICD-10-CM

## 2020-07-06 ENCOUNTER — Other Ambulatory Visit: Payer: Self-pay | Admitting: Physician Assistant

## 2020-07-06 DIAGNOSIS — M0579 Rheumatoid arthritis with rheumatoid factor of multiple sites without organ or systems involvement: Secondary | ICD-10-CM

## 2020-07-08 NOTE — Telephone Encounter (Signed)
Last Visit: 04/04/2020 Next Visit: 09/09/2020 Labs: 03/27/2020 CBC and CMP are normal. Eye exam:  07/31/2019 WNL  Current Dose per office note 04/04/2020: Plaquenil 200 mg 1 tablet by mouth daily DX: Rheumatoid arthritis involving multiple sites with positive rheumatoid factor   Okay to refill per Dr. Corliss Skains

## 2020-08-27 NOTE — Progress Notes (Signed)
Office Visit Note  Patient: Linda Berg             Date of Birth: [birth year]           MRN: [id number]             PCP: Richmond Campbell., PA-C Referring: Richmond Campbell., PA-C Visit Date: 09/09/2020 Occupation: @  Subjective:  Medication management.   History of Present Illness: Linda Berg is a 75 y.o. female with history of rheumatoid arthritis and osteoarthritis overlap.  She states she has been doing well as regards to her arthritis.  She continues to have some pain and stiffness in her joints which is manageable.  She is very active and does routine exercise.  She has been tolerating Plaquenil well.  Activities of Daily Living:  Patient reports morning stiffness for 10 minutes.   Patient Reports nocturnal pain.  Difficulty dressing/grooming: Denies Difficulty climbing stairs: Denies Difficulty getting out of chair: Denies Difficulty using hands for taps, buttons, cutlery, and/or writing: Denies  Review of Systems  Constitutional: Negative for fatigue.  HENT: Positive for mouth dryness and nose dryness. Negative for mouth sores.   Eyes: Positive for dryness. Negative for pain and itching.  Respiratory: Negative for shortness of breath and difficulty breathing.   Cardiovascular: Negative for chest pain and palpitations.  Gastrointestinal: Positive for constipation. Negative for blood in stool and diarrhea.  Endocrine: Negative for increased urination.  Genitourinary: Negative for difficulty urinating.  Musculoskeletal: Positive for arthralgias, joint pain and morning stiffness. Negative for joint swelling, myalgias, muscle tenderness and myalgias.  Skin: Positive for color change. Negative for rash and redness.  Allergic/Immunologic: Negative for susceptible to infections.  Neurological: Negative for dizziness, numbness, headaches, memory loss and weakness.  Hematological: Negative for bruising/bleeding tendency.   Psychiatric/Behavioral: Negative for confusion.    PMFS History:  Patient Active Problem List   Diagnosis Date Noted  . Language difficulty 08/17/2018  . Nonintractable headache 08/17/2018  . Upper airway cough syndrome 10/13/2017  . Pneumomediastinum (HCC)   . Pneumoperitoneum   . Acute respiratory failure (HCC)   . Pneumothorax, traumatic   . Anaphylaxis 08/17/2017  . Trochanteric bursitis of both hips 03/16/2017  . Rheumatoid arthritis involving multiple sites with positive rheumatoid factor (HCC) 10/09/2016  . Sicca syndrome, unspecified 10/09/2016  . Osteopenia of multiple sites 10/09/2016  . History of scoliosis 10/09/2016  . History of gastroesophageal reflux (GERD) 10/09/2016  . History of hypothyroidism 10/09/2016  . History of hyperlipidemia 10/09/2016  . High risk medication use 10/09/2016  . Primary osteoarthritis of both hands 10/09/2016  . Paresthesia 10/11/2014  . Hyperlipidemia 10/11/2014  . Small vessel disease, 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/11/2014  . Tinnitus 09/17/2014  . Abdominal pain 09/05/2014  . Acute cystitis 09/05/2014  . Arthritis 09/05/2014  . Cough 09/05/2014  . Esophagitis 09/05/2014  . Facial numbness 09/05/2014  . Difficulty hearing 09/05/2014  . H/O disease 09/05/2014  . HLD (hyperlipidemia) 09/05/2014  . Muscle ache 09/05/2014  . Symptoms involving urinary system 09/05/2014  . Awareness of heartbeats 09/05/2014  . Acne erythematosa 09/05/2014  . Absence of bladder continence 09/05/2014  . Urgency of micturation 09/05/2014  . Routine general medical examination at a health care facility 09/05/2014  . DYSPHAGIA 01/09/2009  . HYPOTHYROIDISM 11/04/2007  . DYSLIPIDEMIA 11/04/2007  . GERD 11/04/2007  . 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/04/2007  . DUODENITIS, WITHOUT HEMORRHAGE 11/04/2007  . HIATAL HERNIA 11/04/2007  . DIVERTICULOSIS, COLON 11/04/2007  . CONSTIPATION, CHRONIC 11/04/2007  .  RECTAL BLEEDING 11/04/2007    Past Medical History:  Diagnosis  Date  . Barrett esophagus   . Diverticulosis   . Dyslipidemia   . Facial numbness   . GERD (gastroesophageal reflux disease)   . Hearing loss   . Hiatal hernia   . Hypothyroidism   . Rheumatoid arteritis (HCC)     Family History  Problem Relation Age of Onset  . Heart disease Mother   . Rheumatic fever Mother   . Heart failure Mother   . Stroke Father   . Diabetes Father   . Heart failure Father   . Heart disease Maternal Aunt        aunt x 2  . Irritable bowel syndrome Son   . GER disease Son   . Colon cancer Neg Hx    Past Surgical History:  Procedure Laterality Date  . CATARACT EXTRACTION, BILATERAL     04/13/2019, 04/27/2019  . CHEST TUBE INSERTION  08/17/2017  . COLONOSCOPY  08/17/2017  . HAMMER TOE SURGERY Left   . INTRAUTERINE DEVICE INSERTION    . IUD REMOVAL    . KNEE ARTHROPLASTY    . KNEE ARTHROSCOPY  12/25/11   left  . LEFT OOPHORECTOMY  03/13/98  . MANDIBLE SURGERY  9/93   pallete expansion  . TONSILLECTOMY    . TUBAL LIGATION     Social History   Social History Narrative   Live with husband at home.   Left-handed.   1-2 cups per day.   Immunization History  Administered Date(s) Administered  . Influenza, High Dose Seasonal PF 04/29/2016, 05/03/2017  . Influenza-Unspecified 05/21/2010, 04/28/2013, 05/17/2014, 04/30/2015, 05/03/2017, 05/10/2018  . PFIZER(Purple Top)SARS-COV-2 Vaccination 09/02/2019, 09/23/2019, 05/06/2020  . Pneumococcal Conjugate-13 03/29/2014  . Pneumococcal Polysaccharide-23 05/21/2010, 09/05/2018  . Zoster 10/19/2007  . Zoster Recombinat (Shingrix) 12/10/2017, 04/08/2018     Objective: Vital Signs: BP 102/64 (BP Location: Left Arm, Patient Position: Sitting, Cuff Size: Normal)   Pulse 69   Resp 13   Ht 5' 1.5" (1.562 m)   Wt 108 lb 12.8 oz (49.4 kg)   BMI 20.22 kg/m    Physical Exam Vitals and nursing note reviewed.  Constitutional:      Appearance: She is well-developed and well-nourished.  HENT:     Head:  Normocephalic and atraumatic.  Eyes:     Extraocular Movements: EOM normal.     Conjunctiva/sclera: Conjunctivae normal.  Cardiovascular:     Rate and Rhythm: Normal rate and regular rhythm.     Pulses: Intact distal pulses.     Heart sounds: Normal heart sounds.  Pulmonary:     Effort: Pulmonary effort is normal.     Breath sounds: Normal breath sounds.  Abdominal:     General: Bowel sounds are normal.     Palpations: Abdomen is soft.  Musculoskeletal:     Cervical back: Normal range of motion.  Lymphadenopathy:     Cervical: No cervical adenopathy.  Skin:    General: Skin is warm and dry.     Capillary Refill: Capillary refill takes less than 2 seconds.  Neurological:     Mental Status: She is alert and oriented to person, place, and time.  Psychiatric:        Mood and Affect: Mood and affect normal.        Behavior: Behavior normal.      Musculoskeletal Exam: She had stiffness in the range of motion of her cervical spine.  Shoulder joints and elbow joints with good  range of motion.  She had DIP and PIP thickening but no synovitis was noted.  Hip joints, knee joints with good range of motion.  She had no tenderness over ankles or MTPs.  CDAI Exam: CDAI Score: 0.7  Patient Global: 5 mm; Provider Global: 2 mm Swollen: 0 ; Tender: 0  Joint Exam 09/09/2020   No joint exam has been documented for this visit   There is currently no information documented on the homunculus. Go to the Rheumatology activity and complete the homunculus joint exam.  Investigation: No additional findings.  Imaging: No results found.  Recent Labs: Lab Results  Component Value Date   WBC 4.0 09/03/2020   HGB 12.4 09/03/2020   PLT 212 09/03/2020   NA 142 09/03/2020   K 4.8 09/03/2020   CL 104 09/03/2020   CO2 29 09/03/2020   GLUCOSE 88 09/03/2020   BUN 25 09/03/2020   CREATININE 0.75 09/03/2020   BILITOT 0.2 09/03/2020   ALKPHOS 84 12/07/2019   AST 27 09/03/2020   ALT 29 09/03/2020    PROT 6.5 09/03/2020   ALBUMIN 4.4 12/07/2019   CALCIUM 9.1 09/03/2020   GFRAA 91 09/03/2020    Speciality Comments: PLQ eye exam: 07/31/2019 WNL @ [facility] Follow up in 1 year  Procedures:  No procedures performed Allergies: Chlorpheniramine, Ciprofloxacin, Terbinafine, and Lamisil at [b.f.i.]   Assessment / Plan:     Visit Diagnoses: Rheumatoid arthritis involving multiple sites with positive rheumatoid factor (HCC)-patient had no synovitis on examination.  She continues to have intermittent discomfort for patient.  She denies any joint swelling.  High risk medication use - Plaquenil 200 mg 1 tablet by mouth daily. Eye exam: 07/31/2019 documented.  Patient states she had eye examination a week ago which was normal.  We will request report from the ophthalmologist.-Her most recent labs were normal.  Have advised her to get labs in 5 months prior to her next appointment.  Plan: CBC with Differential/Platelet, COMPLETE METABOLIC PANEL WITH GFR  Sjogren's syndrome with other organ involvement (HCC) - Repeat ANA negative (03/27/20), Ro+, La-: She has positive Ro antibody.  Increased risk of arrhythmias with positive Ro antibody and Plaquenil use was discussed.  She is on low-dose Plaquenil.  She has had EKGs in the past which were normal.  Raynaud's syndrome without gangrene-she continues to have some Raynaud's symptoms.  Keeping core temperature warm was discussed.  Primary osteoarthritis of both hands-she has ongoing stiffness in her hands due to osteoarthritis.  Spinal stenosis of cervical region-she has chronic pain and discomfort and limited range of motion.  Have given her a handout on C-spine exercises.  Osteopenia of multiple sites - DEXA performed on 05/01/19: 1/3 right distal radius BMD 0.551 with T-score -2.4.  She will get repeat DEXA scan in September 2022.  Other medical problems are listed as follows:  History of hyperlipidemia  History of  hypothyroidism  History of scoliosis  History of gastroesophageal reflux (GERD)  History of diverticulitis  Educated but COVID-19 virus infection-she is fully vaccinated and had a third dose.  Have advised her to get her fourth dose 5 months after the last dose.  Use of mask, social distancing and hand hygiene was discussed.  Orders: Orders Placed This Encounter  Procedures  . CBC with Differential/Platelet  . COMPLETE METABOLIC PANEL WITH GFR   No orders of the defined types were placed in this encounter.    Follow-Up Instructions: Return in about 5 months (around 02/06/2021) for Rheumatoid  arthritis, Osteoarthritis.   Pollyann Savoy, MD  Note - This record has been created using Animal nutritionist.  Chart creation errors have been sought, but may not always  have been located. Such creation errors do not reflect on  the standard of medical care.

## 2020-09-03 ENCOUNTER — Other Ambulatory Visit: Payer: Self-pay | Admitting: *Deleted

## 2020-09-03 DIAGNOSIS — Z79899 Other long term (current) drug therapy: Secondary | ICD-10-CM

## 2020-09-04 LAB — COMPLETE METABOLIC PANEL WITH GFR
AG Ratio: 1.7 (calc) (ref 1.0–2.5)
ALT: 29 U/L (ref 6–29)
AST: 27 U/L (ref 10–35)
Albumin: 4.1 g/dL (ref 3.6–5.1)
Alkaline phosphatase (APISO): 79 U/L (ref 37–153)
BUN: 25 mg/dL (ref 7–25)
CO2: 29 mmol/L (ref 20–32)
Calcium: 9.1 mg/dL (ref 8.6–10.4)
Chloride: 104 mmol/L (ref 98–110)
Creat: 0.75 mg/dL (ref 0.60–0.93)
GFR, Est African American: 91 mL/min/{1.73_m2} (ref >=60)
GFR, Est Non African American: 79 mL/min/{1.73_m2} (ref >=60)
Globulin: 2.4 g/dL (calc) (ref 1.9–3.7)
Glucose, Bld: 88 mg/dL (ref 65–99)
Potassium: 4.8 mmol/L (ref 3.5–5.3)
Sodium: 142 mmol/L (ref 135–146)
Total Bilirubin: 0.2 mg/dL (ref 0.2–1.2)
Total Protein: 6.5 g/dL (ref 6.1–8.1)

## 2020-09-04 LAB — CBC WITH DIFFERENTIAL/PLATELET
Absolute Monocytes: 444 cells/uL (ref 200–950)
Basophils Absolute: 20 cells/uL (ref 0–200)
Basophils Relative: 0.5 %
Eosinophils Absolute: 100 cells/uL (ref 15–500)
Eosinophils Relative: 2.5 %
HCT: 36 % (ref 35.0–45.0)
Hemoglobin: 12.4 g/dL (ref 11.7–15.5)
Lymphs Abs: 1620 cells/uL (ref 850–3900)
MCH: 32.7 pg (ref 27.0–33.0)
MCHC: 34.4 g/dL (ref 32.0–36.0)
MCV: 95 fL (ref 80.0–100.0)
MPV: 10.8 fL (ref 7.5–12.5)
Monocytes Relative: 11.1 %
Neutro Abs: 1816 cells/uL (ref 1500–7800)
Neutrophils Relative %: 45.4 %
Platelets: 212 10*3/uL (ref 140–400)
RBC: 3.79 10*6/uL — ABNORMAL LOW (ref 3.80–5.10)
RDW: 11.6 % (ref 11.0–15.0)
Total Lymphocyte: 40.5 %
WBC: 4 10*3/uL (ref 3.8–10.8)

## 2020-09-04 NOTE — Progress Notes (Signed)
RBC count is borderline low.  Rest of CBC WNL.  CMP WNL.

## 2020-09-05 ENCOUNTER — Ambulatory Visit: Payer: Medicare Other | Admitting: Rheumatology

## 2020-09-08 ENCOUNTER — Other Ambulatory Visit: Payer: Self-pay | Admitting: Rheumatology

## 2020-09-08 DIAGNOSIS — M0579 Rheumatoid arthritis with rheumatoid factor of multiple sites without organ or systems involvement: Secondary | ICD-10-CM

## 2020-09-09 ENCOUNTER — Encounter: Payer: Self-pay | Admitting: Rheumatology

## 2020-09-09 ENCOUNTER — Ambulatory Visit: Payer: Medicare Other | Admitting: Rheumatology

## 2020-09-09 ENCOUNTER — Other Ambulatory Visit: Payer: Self-pay

## 2020-09-09 VITALS — BP 102/64 | HR 69 | Resp 13 | Ht 61.5 in | Wt 108.8 lb

## 2020-09-09 DIAGNOSIS — M3509 Sicca syndrome with other organ involvement: Secondary | ICD-10-CM

## 2020-09-09 DIAGNOSIS — Z8719 Personal history of other diseases of the digestive system: Secondary | ICD-10-CM

## 2020-09-09 DIAGNOSIS — Z8739 Personal history of other diseases of the musculoskeletal system and connective tissue: Secondary | ICD-10-CM

## 2020-09-09 DIAGNOSIS — M4802 Spinal stenosis, cervical region: Secondary | ICD-10-CM

## 2020-09-09 DIAGNOSIS — M0579 Rheumatoid arthritis with rheumatoid factor of multiple sites without organ or systems involvement: Secondary | ICD-10-CM

## 2020-09-09 DIAGNOSIS — M19042 Primary osteoarthritis, left hand: Secondary | ICD-10-CM

## 2020-09-09 DIAGNOSIS — I7300 Raynaud's syndrome without gangrene: Secondary | ICD-10-CM | POA: Diagnosis not present

## 2020-09-09 DIAGNOSIS — Z79899 Other long term (current) drug therapy: Secondary | ICD-10-CM

## 2020-09-09 DIAGNOSIS — M8589 Other specified disorders of bone density and structure, multiple sites: Secondary | ICD-10-CM

## 2020-09-09 DIAGNOSIS — Z7189 Other specified counseling: Secondary | ICD-10-CM

## 2020-09-09 DIAGNOSIS — Z8639 Personal history of other endocrine, nutritional and metabolic disease: Secondary | ICD-10-CM

## 2020-09-09 DIAGNOSIS — M19041 Primary osteoarthritis, right hand: Secondary | ICD-10-CM

## 2020-09-09 NOTE — Patient Instructions (Addendum)
Cervical Strain and Sprain Rehab Ask your health care provider which exercises are safe for you. Do exercises exactly as told by your health care provider and adjust them as directed. It is normal to feel mild stretching, pulling, tightness, or discomfort as you do these exercises. Stop right away if you feel sudden pain or your pain gets worse. Do not begin these exercises until told by your health care provider. Stretching and range-of-motion exercises Cervical side bending 1. Using good posture, sit on a stable chair or stand up. 2. Without moving your shoulders, slowly tilt your left / right ear to your shoulder until you feel a stretch in the opposite side neck muscles. You should be looking straight ahead. 3. Hold for __________ seconds. 4. Repeat with the other side of your neck. Repeat __________ times. Complete this exercise __________ times a day.   Cervical rotation 1. Using good posture, sit on a stable chair or stand up. 2. Slowly turn your head to the side as if you are looking over your left / right shoulder. ? Keep your eyes level with the ground. ? Stop when you feel a stretch along the side and the back of your neck. 3. Hold for __________ seconds. 4. Repeat this by turning to your other side. Repeat __________ times. Complete this exercise __________ times a day.   Thoracic extension and pectoral stretch 1. Roll a towel or a small blanket so it is about 4 inches (10 cm) in diameter. 2. Lie down on your back on a firm surface. 3. Put the towel lengthwise, under your spine in the middle of your back. It should not be under your shoulder blades. The towel should line up with your spine from your middle back to your lower back. 4. Put your hands behind your head and let your elbows fall out to your sides. 5. Hold for __________ seconds. Repeat __________ times. Complete this exercise __________ times a day. Strengthening exercises Isometric upper cervical flexion 1. Lie on  your back with a thin pillow behind your head and a small rolled-up towel under your neck. 2. Gently tuck your chin toward your chest and nod your head down to look toward your feet. Do not lift your head off the pillow. 3. Hold for __________ seconds. 4. Release the tension slowly. Relax your neck muscles completely before you repeat this exercise. Repeat __________ times. Complete this exercise __________ times a day. Isometric cervical extension 1. Stand about 6 inches (15 cm) away from a wall, with your back facing the wall. 2. Place a soft object, about 6-8 inches (15-20 cm) in diameter, between the back of your head and the wall. A soft object could be a small pillow, a ball, or a folded towel. 3. Gently tilt your head back and press into the soft object. Keep your jaw and forehead relaxed. 4. Hold for __________ seconds. 5. Release the tension slowly. Relax your neck muscles completely before you repeat this exercise. Repeat __________ times. Complete this exercise __________ times a day.   Posture and body mechanics Body mechanics refers to the movements and positions of your body while you do your daily activities. Posture is part of body mechanics. Good posture and healthy body mechanics can help to relieve stress in your body's tissues and joints. Good posture means that your spine is in its natural S-curve position (your spine is neutral), your shoulders are pulled back slightly, and your head is not tipped forward. The following are general guidelines  for applying improved posture and body mechanics to your everyday activities. Sitting 1. When sitting, keep your spine neutral and keep your feet flat on the floor. Use a footrest, if necessary, and keep your thighs parallel to the floor. Avoid rounding your shoulders, and avoid tilting your head forward. 2. When working at a desk or a computer, keep your desk at a height where your hands are slightly lower than your elbows. Slide your  chair under your desk so you are close enough to maintain good posture. 3. When working at a computer, place your monitor at a height where you are looking straight ahead and you do not have to tilt your head forward or downward to look at the screen.   Standing  When standing, keep your spine neutral and keep your feet about hip-width apart. Keep a slight bend in your knees. Your ears, shoulders, and hips should line up.  When you do a task in which you stand in one place for a long time, place one foot up on a stable object that is 2-4 inches (5-10 cm) high, such as a footstool. This helps keep your spine neutral.   Resting When lying down and resting, avoid positions that are most painful for you. Try to support your neck in a neutral position. You can use a contour pillow or a small rolled-up towel. Your pillow should support your neck but not push on it. This information is not intended to replace advice given to you by your health care provider. Make sure you discuss any questions you have with your health care provider. Document Revised: 11/16/2018 Document Reviewed: 04/27/2018 Elsevier Patient Education  2021 Elsevier Inc.    Standing Labs We placed an order today for your standing lab work.   Please have your standing labs drawn in June  If possible, please have your labs drawn 2 weeks prior to your appointment so that the provider can discuss your results at your appointment.  We have open lab daily Monday through Thursday from 8:30-12:30 PM and 1:30-4:30 PM and Friday from 8:30-12:30 PM and 1:30-4:00 PM at the office of Dr. Pollyann Savoy, [facility].   Please be advised, all patients with office appointments requiring lab work will take precedents over walk-in lab work.  If possible, please come for your lab work on Monday and Friday afternoons, as you may experience shorter wait times. The office is located at [address], [place], [place]  [address] No appointment is necessary.   Labs are drawn by [facility]. Please bring your co-pay at the time of your lab draw.  You may receive a bill from [facility] for your lab work.  If you wish to have your labs drawn at another location, please call the office 24 hours in advance to send orders.  If you have any questions regarding directions or hours of operation,  please call [phone].   As a reminder, please drink plenty of water prior to coming for your lab work. Thanks!    COVID-19 vaccine recommendations:   COVID-19 vaccine is recommended for everyone (unless you are allergic to a vaccine component), even if you are on a medication that suppresses your immune system.   For immunocompromised individuals the recommendations are to get COVID-19 vaccine first 3 doses 1 month apart and then a booster (fourth dose) 5 months later.  Do not take Tylenol or any anti-inflammatory medications (NSAIDs) 24 hours prior to the COVID-19 vaccination.   There is no  direct evidence about the efficacy of the COVID-19 vaccine in individuals who are on medications that suppress the immune system.   Even if you are fully vaccinated, and you are on any medications that suppress your immune system, please continue to wear a mask, maintain at least six feet social distance and practice hand hygiene.   If you develop a COVID-19 infection, please contact your PCP or our office to determine if you need monoclonal antibody infusion.  The booster vaccine is now available for immunocompromised patients.   Please see the following web sites for updated information.   [email or web address]

## 2020-09-16 ENCOUNTER — Encounter: Payer: Self-pay | Admitting: Gastroenterology

## 2020-10-01 ENCOUNTER — Telehealth: Payer: Self-pay

## 2020-10-01 ENCOUNTER — Ambulatory Visit: Payer: Medicare Other | Admitting: Gastroenterology

## 2020-10-01 ENCOUNTER — Encounter: Payer: Self-pay | Admitting: Gastroenterology

## 2020-10-01 VITALS — BP 108/60 | HR 78 | Ht 61.5 in | Wt 109.4 lb

## 2020-10-01 DIAGNOSIS — K5909 Other constipation: Secondary | ICD-10-CM | POA: Diagnosis not present

## 2020-10-01 DIAGNOSIS — R1319 Other dysphagia: Secondary | ICD-10-CM

## 2020-10-01 NOTE — Telephone Encounter (Signed)
The pt has been advised via message that she is now a Dr Russella Dar patient.  She was told to call if she wished to make an appt.

## 2020-10-01 NOTE — Telephone Encounter (Signed)
-----   Message from Leta Baptist, PA-C sent at 10/01/2020  4:52 PM EST ----- Please make the patient aware that both of these physicians approved her transfer of care.  Her GI MD will now be Dr. Russella Dar.  Thank you,  Jess  ----- Message ----- From: Napoleon Form, MD Sent: 10/01/2020   4:43 PM EST To: Leta Baptist, PA-C  ok ----- Message ----- From: Leta Baptist, PA-C Sent: 10/01/2020   2:19 PM EST To: Meryl Dare, MD, Napoleon Form, MD  Patient would like to transfer care from Dr. Lavon Paganini to Dr. Russella Dar.

## 2020-10-01 NOTE — Patient Instructions (Signed)
If you are age 75 or older, your body mass index should be between 23-30. Your Body mass index is 20.33 kg/m. If this is out of the aforementioned range listed, please consider follow up with your Primary Care Provider.  If you are age 52 or younger, your body mass index should be between 19-25. Your Body mass index is 20.33 kg/m. If this is out of the aformentioned range listed, please consider follow up with your Primary Care Provider.   We have given you samples of the following medication to take: Linzess 145 mcg daily before breakfast.  IMAGING:  . You will be contacted by [facility] Scheduling (Your caller ID will indicate phone # [phone]) within the next business 2 days to schedule your Barium Esophagram. If you have not heard from them within 2 business days, please call [facility] Scheduling at [phone] to follow up on the status of your appointment.    Call back in 1 week with update and ask for Hilma Favors, RN.

## 2020-10-01 NOTE — Progress Notes (Signed)
10/01/2020 Linda Berg [id number] 20-Nov-1945   HISTORY OF PRESENT ILLNESS:  This is a a 75 year old female who is a patient of Dr. Elana Alm.  She is wanting to switch care to Dr. Russella Dar, however.  She is here today to discuss her bowel issues and her dysphagia.  She has underlying constipation but randomly she will have episodes of diarrhea.  She is not able to associate the diarrhea with certain things that she eats.  Diarrhea seems to occur after several days of constipation.  She also complains of dysphagia, which has been an issue intermittently for a long time and was one of the indications for her EGD in 2019, with nothing found to account for her complaints of dysphagia.  Her last EGD and colonoscopy were in 08/2017.  EGD showed irregular Z-line with biopsies showing focal intestinal metaplasia.  Colonoscopy revealed sigmoid diverticulosis.  There was a mucosal tear in the rectosigmoid colon to which MR conditional clips were applied.  Unfortunately she ended up with a 4 day hospital stay after her EGD and colonoscopy.  Initially it was thought that she had anaphylaxis/angioedema to Propofol, but then it was determined that she likely had pneumomediastinum, pneumothorax, pneumoperitoneum and subcutaneous emphysema with associated acute respiratory failure related to microperforation during colonoscopy.  There was no obvious perforated viscus noted on imaging but a lot of free air.  Past Medical History:  Diagnosis Date  . Barrett esophagus   . Diverticulosis   . Dyslipidemia   . Facial numbness   . GERD (gastroesophageal reflux disease)   . Hearing loss   . Hiatal hernia   . Hypothyroidism   . Rheumatoid arteritis (HCC)    Past Surgical History:  Procedure Laterality Date  . CATARACT EXTRACTION, BILATERAL     04/13/2019, 04/27/2019  . CHEST TUBE INSERTION  08/17/2017  . COLONOSCOPY  08/17/2017  . HAMMER TOE SURGERY Left   . INTRAUTERINE DEVICE INSERTION     . IUD REMOVAL    . KNEE ARTHROPLASTY    . KNEE ARTHROSCOPY  12/25/11   left  . LEFT OOPHORECTOMY  03/13/98  . MANDIBLE SURGERY  9/93   pallete expansion  . TONSILLECTOMY    . TUBAL LIGATION      reports that she has never smoked. She has never used smokeless tobacco. She reports that she does not drink alcohol and does not use drugs. family history includes Diabetes in her father; GER disease in her son; Heart disease in her maternal aunt and mother; Heart failure in her father and mother; Irritable bowel syndrome in her son; Rheumatic fever in her mother; Stroke in her father. Allergies  Allergen Reactions  . Chlorpheniramine Hives and Shortness Of Breath  . Ciprofloxacin Anaphylaxis    Facial numbness  . Terbinafine Rash  . Lamisil At [B.F.I.]       Outpatient Encounter Medications as of 10/01/2020  Medication Sig  . albuterol (VENTOLIN HFA) 108 (90 Base) MCG/ACT inhaler Inhale into the lungs as needed.  . AMBULATORY NON FORMULARY MEDICATION ALLERGY SHOTS Once a week  . aspirin 81 MG tablet Take 81 mg by mouth daily.   Marland Kitchen azelastine (ASTELIN) 137 MCG/SPRAY nasal spray Place 1 spray into the nose as needed. Use in each nostril as directed  . Biotin 5000 MCG TABS Take 1 tablet by mouth 1 day or 1 dose.   . cetirizine (ZYRTEC) 10 MG tablet Take 10 mg by mouth at bedtime.  . cholecalciferol (VITAMIN  D) 1000 units tablet Take 1,000 Units by mouth daily.  . Cyanocobalamin (VITAMIN B 12 PO) Take 1 tablet by mouth daily.  . cycloSPORINE (RESTASIS) 0.05 % ophthalmic emulsion Place 1 drop into both eyes 2 (two) times daily.   Marland Kitchen EPINEPHrine 0.3 mg/0.3 mL IJ SOAJ injection epinephrine 0.3 mg/0.3 mL injection, auto-injector  . esomeprazole (NEXIUM) 20 MG capsule Take 20 mg by mouth daily.   . fluocinonide (LIDEX) 0.05 % external solution fluocinonide 0.05 % topical solution  . hydroxychloroquine (PLAQUENIL) 200 MG tablet TAKE 1 TABLET BY MOUTH  DAILY  . Krill Oil 300 MG CAPS Take 1 capsule by  mouth daily.  Marland Kitchen lovastatin (MEVACOR) 20 MG tablet Take 20 mg by mouth daily.  . Magnesium 250 MG TABS Take 1 tablet by mouth daily.  . Probiotic Product (PROBIOTIC DAILY PO) Take 1 tablet by mouth daily.   Marland Kitchen thyroid (ARMOUR) 60 MG tablet Take 60 mg by mouth daily before breakfast.  . vitamin C (ASCORBIC ACID) 500 MG tablet Take 1,000 mg by mouth 2 (two) times daily.   . Zinc 50 MG CAPS Take by mouth.   No facility-administered encounter medications on file as of 10/01/2020.     REVIEW OF SYSTEMS  : All other systems reviewed and negative except where noted in the History of Present Illness.   PHYSICAL EXAM: BP 108/60   Pulse 78   Ht 5' 1.5" (1.562 m)   Wt 109 lb 6 oz (49.6 kg)   BMI 20.33 kg/m  General: Well developed white female in no acute distress Head: Normocephalic and atraumatic Eyes:  Sclerae anicteric, conjunctiva pink. Ears: Normal auditory acuity Lungs: Clear throughout to auscultation; no W/R/R. Heart: Regular rate and rhythm; no M/R/G. Abdomen: Soft, non-distended.  BS present.  Non-tender. Musculoskeletal: Symmetrical with no gross deformities  Skin: No lesions on visible extremities Extremities: No edema  Neurological: Alert oriented x 4, grossly non-focal Psychological:  Alert and cooperative. Normal mood and affect  ASSESSMENT AND PLAN: *Chronic constipation with intermittent and random episodes of diarrhea: I think that she is actually probably having overflow diarrhea or breakthrough diarrhea as a consequence her underlying constipation.  I think that we need to more aggressively treat the constipation.  We will start her on Linzess 145 mcg daily.  Samples were given and have asked her to call back with an update in about a week to see if we need to titrate the medication and sent a prescription. *Dysphagia: Has history of Barrett's with last EGD being in January 2019.  Had complaints of dysphagia at that time with no esophageal findings to explain that.   Continues to complain of occasional dysphagia mostly to apples, oranges, and sometimes pills.  We will plan for an esophagram to rule out any structural issues as well as esophageal dysmotility, etc. *Complications related to last colonoscopy in 08/2017   CC:  Richmond Campbell., PA-C

## 2020-10-02 ENCOUNTER — Encounter: Payer: Self-pay | Admitting: Gastroenterology

## 2020-10-02 NOTE — Progress Notes (Signed)
Reviewed and agree with management plan.  Malcolm T. Stark, MD FACG [phone]  

## 2020-10-14 ENCOUNTER — Other Ambulatory Visit: Payer: Self-pay | Admitting: Rheumatology

## 2020-10-14 ENCOUNTER — Telehealth: Payer: Self-pay | Admitting: Gastroenterology

## 2020-10-14 DIAGNOSIS — M0579 Rheumatoid arthritis with rheumatoid factor of multiple sites without organ or systems involvement: Secondary | ICD-10-CM

## 2020-10-14 NOTE — Telephone Encounter (Signed)
Linda Berg this pt just needs samples.  No triage needed.  Thank you

## 2020-10-14 NOTE — Telephone Encounter (Signed)
Last Visit: 09/09/2020 Next Visit: 02/06/2021 Labs: 09/03/2020, RBC count is borderline low. Rest of CBC WNL. CMP WNL.  Eye exam: 09/05/2020  Current Dose per office note 09/09/2020, Plaquenil 200 mg 1 tablet by mouth daily DX: Rheumatoid arthritis involving multiple sites with positive rheumatoid factor   Last Fill: 07/08/2020  Okay to refill Plaquenil?

## 2020-10-15 ENCOUNTER — Ambulatory Visit (HOSPITAL_COMMUNITY)
Admission: RE | Admit: 2020-10-15 | Discharge: 2020-10-15 | Disposition: A | Discharge status: Home / Self Care | Payer: Medicare Other | Source: Ambulatory Visit | Attending: Gastroenterology | Admitting: Gastroenterology

## 2020-10-15 ENCOUNTER — Other Ambulatory Visit: Payer: Self-pay

## 2020-10-15 DIAGNOSIS — R1319 Other dysphagia: Secondary | ICD-10-CM | POA: Diagnosis not present

## 2020-10-15 NOTE — Telephone Encounter (Signed)
Samples placed up front. Patient informed.

## 2020-10-18 ENCOUNTER — Telehealth: Payer: Self-pay | Admitting: Gastroenterology

## 2020-10-18 NOTE — Telephone Encounter (Signed)
See results note 3/10

## 2020-10-18 NOTE — Telephone Encounter (Signed)
Inbound call from patient returning nurse's call in regards to results.

## 2020-12-06 ENCOUNTER — Other Ambulatory Visit: Payer: Self-pay | Admitting: Physician Assistant

## 2020-12-06 DIAGNOSIS — M0579 Rheumatoid arthritis with rheumatoid factor of multiple sites without organ or systems involvement: Secondary | ICD-10-CM

## 2020-12-09 NOTE — Telephone Encounter (Signed)
Last Visit: 09/09/2020 Next Visit: 02/06/2021 Labs: 09/03/2020, RBC count is borderline low. Rest of CBC WNL. CMP WNL.  Eye exam: 09/05/2020  Current Dose per office note 09/09/2020, Plaquenil 200 mg 1 tablet by mouth daily  DX: Rheumatoid arthritis involving multiple sites with positive rheumatoid factor  Last Fill: 10/14/2020  Okay to refill Plaquenil?

## 2021-01-23 NOTE — Progress Notes (Signed)
Office Visit Note  Patient: Linda Berg             Date of Birth: 19-Feb-1946           MRN: [id number]             PCP: Richmond Campbell., PA-C Referring: Richmond Campbell., PA-C Visit Date: 02/06/2021 Occupation: @  Subjective:  Pain in both feet   History of Present Illness: Linda Berg is a 75 y.o. female with history of seropositive rheumatoid arthritis, Sjogren's syndrome, and osteopenia.  Patient is taking Plaquenil 200 mg 1 tablet by mouth daily.  She continues to tolerate Plaquenil without any side effects.  She denies any recent rheumatoid arthritis flares.  She continues to experience intermittent pain and stiffness in both hands and both feet.  She uses Aspercreme topically as needed on her feet to alleviate discomfort.  She denies any joint swelling at this time.  Patient reports that she continues to have chronic sicca symptoms which have been tolerable overall.  She uses Restasis eyedrops on a daily basis and Systane eyedrops for breakthrough symptoms.  She has been using XyliMelts at bedtime as well as ACT dry mouth lozenges as needed for symptomatic relief.  She continues to see her ophthalmologist on a regular basis and her dentist every 6 months.  She denies any recent dental caries.  She denies any parotid swelling or tenderness.  She denies any salivary stones.  She denies any swollen lymph nodes.  She has not had any shortness of breath. She denies any recent infections.     Activities of Daily Living:  Patient reports morning stiffness for 5 minutes.   Patient Reports nocturnal pain.  Difficulty dressing/grooming: Denies Difficulty climbing stairs: Denies Difficulty getting out of chair: Denies Difficulty using hands for taps, buttons, cutlery, and/or writing: Denies  Review of Systems  Constitutional:  Negative for fatigue.  HENT:  Positive for mouth dryness and nose dryness. Negative for mouth sores.   Eyes:   Positive for dryness. Negative for pain and itching.  Respiratory:  Negative for shortness of breath and difficulty breathing.   Cardiovascular:  Positive for swelling in legs/feet. Negative for chest pain and palpitations.  Gastrointestinal:  Negative for blood in stool, constipation and diarrhea.  Endocrine: Negative for increased urination.  Genitourinary:  Negative for difficulty urinating.  Musculoskeletal:  Positive for myalgias, morning stiffness and myalgias. Negative for joint pain, joint pain, joint swelling and muscle tenderness.  Skin:  Negative for color change, rash and redness.  Allergic/Immunologic: Negative for susceptible to infections.  Neurological:  Negative for dizziness, numbness, headaches, memory loss and weakness.  Hematological:  Positive for bruising/bleeding tendency.  Psychiatric/Behavioral:  Negative for confusion.    PMFS History:  Patient Active Problem List   Diagnosis Date Noted   Language difficulty 08/17/2018   Nonintractable headache 08/17/2018   Upper airway cough syndrome 10/13/2017   Pneumomediastinum (HCC)    Pneumoperitoneum    Acute respiratory failure (HCC)    Pneumothorax, traumatic    Anaphylaxis 08/17/2017   Trochanteric bursitis of both hips 03/16/2017   Rheumatoid arthritis involving multiple sites with positive rheumatoid factor (HCC) 10/09/2016   Sicca syndrome, unspecified 10/09/2016   Osteopenia of multiple sites 10/09/2016   History of scoliosis 10/09/2016   History of gastroesophageal reflux (GERD) 10/09/2016   History of hypothyroidism 10/09/2016   History of hyperlipidemia 10/09/2016   High risk medication use 10/09/2016   Primary osteoarthritis of both  hands 10/09/2016   Paresthesia 10/11/2014   Hyperlipidemia 10/11/2014   Small vessel disease, 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/11/2014   Tinnitus 09/17/2014   Abdominal pain 09/05/2014   Acute cystitis 09/05/2014   Arthritis 09/05/2014   Cough 09/05/2014   Esophagitis 09/05/2014    Facial numbness 09/05/2014   Difficulty hearing 09/05/2014   H/O disease 09/05/2014   HLD (hyperlipidemia) 09/05/2014   Muscle ache 09/05/2014   Symptoms involving urinary system 09/05/2014   Awareness of heartbeats 09/05/2014   Acne erythematosa 09/05/2014   Absence of bladder continence 09/05/2014   Urgency of micturation 09/05/2014   Routine general medical examination at a health care facility 09/05/2014   DYSPHAGIA 01/09/2009   HYPOTHYROIDISM 11/04/2007   DYSLIPIDEMIA 11/04/2007   GERD 11/04/2007   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/04/2007   DUODENITIS, WITHOUT HEMORRHAGE 11/04/2007   HIATAL HERNIA 11/04/2007   DIVERTICULOSIS, COLON 11/04/2007   CONSTIPATION, CHRONIC 11/04/2007   RECTAL BLEEDING 11/04/2007    Past Medical History:  Diagnosis Date   Barrett esophagus    Diverticulosis    Dyslipidemia    Facial numbness    GERD (gastroesophageal reflux disease)    Hearing loss    Hiatal hernia    Hypothyroidism    Rheumatoid arteritis (HCC)     Family History  Problem Relation Age of Onset   Heart disease Mother    Rheumatic fever Mother    Heart failure Mother    Stroke Father    Diabetes Father    Heart failure Father    Heart disease Maternal Aunt        aunt x 2   Irritable bowel syndrome Son    GER disease Son    Colon cancer Neg Hx    Past Surgical History:  Procedure Laterality Date   CATARACT EXTRACTION, BILATERAL     04/13/2019, 04/27/2019   CHEST TUBE INSERTION  08/17/2017   COLONOSCOPY  08/17/2017   HAMMER TOE SURGERY Left    INTRAUTERINE DEVICE INSERTION     IUD REMOVAL     KNEE ARTHROPLASTY     KNEE ARTHROSCOPY  12/25/11   left   LEFT OOPHORECTOMY  03/13/98   MANDIBLE SURGERY  9/93   pallete expansion   TONSILLECTOMY     TUBAL LIGATION     Social History   Social History Narrative   Live with husband at home.   Left-handed.   1-2 cups per day.   Immunization History  Administered Date(s) Administered   Influenza, High Dose Seasonal PF  04/29/2016, 05/03/2017   Influenza-Unspecified 05/21/2010, 04/28/2013, 05/17/2014, 04/30/2015, 05/03/2017, 05/10/2018   PFIZER(Purple Top)SARS-COV-2 Vaccination 09/02/2019, 09/23/2019, 05/06/2020   Pneumococcal Conjugate-13 03/29/2014   Pneumococcal Polysaccharide-23 05/21/2010, 09/05/2018   Zoster Recombinat (Shingrix) 12/10/2017, 04/08/2018   Zoster, Live 10/19/2007     Objective: Vital Signs: BP 113/72 (BP Location: Left Arm, Patient Position: Sitting, Cuff Size: Normal)   Pulse 62   Resp 14   Ht 5' 1.5" (1.562 m)   Wt 109 lb 12.8 oz (49.8 kg)   BMI 20.41 kg/m    Physical Exam Vitals and nursing note reviewed.  Constitutional:      Appearance: She is well-developed.  HENT:     Head: Normocephalic and atraumatic.  Eyes:     Conjunctiva/sclera: Conjunctivae normal.  Pulmonary:     Effort: Pulmonary effort is normal.  Abdominal:     Palpations: Abdomen is soft.  Musculoskeletal:     Cervical back: Normal range of motion.  Skin:    General: Skin is  warm and dry.     Capillary Refill: Capillary refill takes less than 2 seconds.  Neurological:     Mental Status: She is alert and oriented to person, place, and time.  Psychiatric:        Behavior: Behavior normal.     Musculoskeletal Exam: C-spine has good range of motion with no discomfort.  Mild postural thoracic kyphosis noted.  Shoulder joints, elbow joints, wrist joints, MCPs, PIPs, DIPs have good range of motion with no synovitis.  PIP and DIP thickening consistent with osteoarthritis of both hands.  Subluxation of several DIP joints noted.  Complete fist formation bilaterally.  Synovial thickening over the right second and third MCP joints.  CMC joint prominence noted bilaterally.  Hip joints have good range of motion with no discomfort.  Knee joints have good range of motion with no warmth or effusion.  Ankle joints have good range of motion with no tenderness or joint swelling.  Mild pedal edema noted bilaterally.  No  tenderness over MTP joints. Tailor bunions noted bilaterally.  PIP and DIP thickening consistent with osteoarthritis of both feet noted.  Overcrowding of the toes in the left foot noted.  CDAI Exam: CDAI Score: 0.7  Patient Global: 5 mm; Provider Global: 2 mm Swollen: 0 ; Tender: 0  Joint Exam 02/06/2021   No joint exam has been documented for this visit   There is currently no information documented on the homunculus. Go to the Rheumatology activity and complete the homunculus joint exam.  Investigation: No additional findings.  Imaging: No results found.  Recent Labs: Lab Results  Component Value Date   WBC 6.7 02/04/2021   HGB 12.7 02/04/2021   PLT 205 02/04/2021   NA 135 02/04/2021   K 4.6 02/04/2021   CL 99 02/04/2021   CO2 29 02/04/2021   GLUCOSE 82 02/04/2021   BUN 16 02/04/2021   CREATININE 0.68 02/04/2021   BILITOT 0.4 02/04/2021   ALKPHOS 84 12/07/2019   AST 20 02/04/2021   ALT 20 02/04/2021   PROT 6.8 02/04/2021   ALBUMIN 4.4 12/07/2019   CALCIUM 9.1 02/04/2021   GFRAA 100 02/04/2021    Speciality Comments: PLQ eye exam: 09/05/2020 WNL @ [facility] Follow up in 1 year  Procedures:  No procedures performed Allergies: Ciprofloxacin, Terbinafine, and Lamisil at [b.f.i.]   Assessment / Plan:     Visit Diagnoses: Rheumatoid arthritis involving multiple sites with positive rheumatoid factor (HCC): She has no joint tenderness or synovitis on examination today.  She is clinically doing well taking Plaquenil 200 mg 1 tablet by mouth daily.  She continues to tolerate Plaquenil without any side effects and has not missed any doses recently.  She has not had any recent rheumatoid arthritis flares.  She has occasional pain and stiffness in both hands and both feet due to underlying osteoarthritis.  She uses Aspercreme topically as needed for pain relief.  She will remain on Plaquenil as prescribed.  She does not need a refill at this time.  She was advised to  notify us if she develops increased joint pain or joint swelling.  She will follow-up in the office in 5 months.  High risk medication use - Plaquenil 200 mg 1 tablet by mouth daily. PLQ eye exam: 09/05/2020.  CBC and CMP WNL 02/04/21. She has not had any recent infections.  She is planning on receiving the second COVID-19 vaccine booster.  Sjogren's syndrome with other organ involvement (HCC) - Repeat ANA negative (03/27/20),  Ro+, La-: She has has ongoing sicca symptoms which have been tolerable overall.  She has been using Restasis eyedrops on a daily basis and Systane eyedrops for breakthrough symptoms.  For dry mouth she has been XyliMelts at bedtime and dry mouth lozenges during the daytime for symptomatic relief.  She has no parotid swelling or tenderness.  She has not had any salivary stones.  She continues to see her dentist every 6 months.  She has not had any recent dental caries.  She has no shortness of breath.  She will remain on Plaquenil as prescribed.  She was advised to notify us if she develops any new or worsening symptoms.  Raynaud's syndrome without gangrene: Not currently active.  No digital ulcerations or signs of gangrene noted.  Primary osteoarthritis of both hands: She has PIP and DIP thickening consistent with osteoarthritis of both hands.  Subluxation of several DIP joints noted.  Complete fist formation bilaterally.  Discussed the importance of joint protection and muscle strengthening.  She was given a handout of hand exercises to perform.  Spinal stenosis of cervical region: C-spine has good range of motion on examination today with no discomfort.  No symptoms of radiculopathy at this time.  Osteopenia of multiple sites - DEXA performed on 05/01/19: 1/3 right distal radius BMD 0.551 with T-score -2.4.  She will get repeat DEXA scan in September 2022.  Future order for DEXA was placed today.  She will continue taking vitamin D 1000 units daily.- Plan: DG BONE DENSITY  (DXA)  Other medical conditions are listed as follows:  History of hyperlipidemia  History of hypothyroidism  History of gastroesophageal reflux (GERD)  History of scoliosis  History of diverticulitis  Orders: Orders Placed This Encounter  Procedures   DG BONE DENSITY (DXA)   No orders of the defined types were placed in this encounter.   Follow-Up Instructions: Return in about 5 months (around 07/09/2021) for Rheumatoid arthritis, Osteoarthritis.   Gearldine Bienenstock, PA-C  Note - This record has been created using Dragon software.  Chart creation errors have been sought, but may not always  have been located. Such creation errors do not reflect on  the standard of medical care.

## 2021-01-31 ENCOUNTER — Encounter: Payer: Self-pay | Admitting: Gastroenterology

## 2021-01-31 ENCOUNTER — Ambulatory Visit: Payer: Medicare Other | Admitting: Gastroenterology

## 2021-01-31 VITALS — BP 110/60 | HR 82 | Ht 61.5 in | Wt 109.0 lb

## 2021-01-31 DIAGNOSIS — K5909 Other constipation: Secondary | ICD-10-CM | POA: Diagnosis not present

## 2021-01-31 NOTE — Progress Notes (Signed)
    History of Present Illness: This is a 75 year old female returning for follow-up of constipation.  Linzess led to worsening diarrhea and intestinal gas so she discontinued it.  MiraLAX and IBgard have been helping although her bowels are occasionally loose.  The variability in her bowel habits makes it somewhat difficult to find an appropriate daily dose of MiraLAX.   Current Medications, Allergies, Past Medical History, Past Surgical History, Family History and Social History were reviewed in [employer] record.   Physical Exam: General: Well developed, well nourished, no acute distress Head: Normocephalic and atraumatic Eyes: Sclerae anicteric, EOMI Ears: Normal auditory acuity Mouth: Not examined, mask on during Covid-19 pandemic Lungs: Clear throughout to auscultation Heart: Regular rate and rhythm; no murmurs, rubs or bruits Abdomen: Soft, non tender and non distended. No masses, hepatosplenomegaly or hernias noted. Normal Bowel sounds Rectal: Not done Musculoskeletal: Symmetrical with no gross deformities  Pulses:  Normal pulses noted Extremities: No clubbing, cyanosis, edema or deformities noted Neurological: Alert oriented x 4, grossly nonfocal Psychological:  Alert and cooperative. Normal mood and affect   Assessment and Recommendations:  Chronic constipation.  Continue MiraLAX 1/2 scoop to 2 scoops daily titrated for a complete bowel movement daily.  Target for normal to slightly looser stools to avoid constipation.  Continue IBgard 1-2 p.o. 3 times daily as needed abdominal pain.  Follow-up with PCP.  GI follow up prn.   2.   Barrett's esophagus.  Continue Nexium 20 mg daily.  Follow antireflux measures.  Surveillance EGD in Jan 2024.

## 2021-01-31 NOTE — Patient Instructions (Signed)
Stay on Miralax and titrate up or down to have a bowel movement every day.   Due to recent changes in healthcare laws, you may see the results of your imaging and laboratory studies on MyChart before your provider has had a chance to review them.  We understand that in some cases there may be results that are confusing or concerning to you. Not all laboratory results come back in the same time frame and the provider may be waiting for multiple results in order to interpret others.  Please give [place] 48 hours in order for your provider to thoroughly review all the results before contacting the office for clarification of your results.  The [facility] providers would like to encourage you to use [facility] to communicate with providers for non-urgent requests or questions.  Due to long hold times on the telephone, sending your provider a message by [facility] may be a faster and more efficient way to get a response.  Please allow 48 business hours for a response.  Please remember that this is for non-urgent requests.   Normal BMI (Body Mass Index- based on height and weight) is between 23 and 30. Your BMI today is Body mass index is 20.26 kg/m. Marland Kitchen Please consider follow up  regarding your BMI with your Primary Care Provider.   Thank you for choosing me and [facility].  Venita Lick. Pleas Koch., MD., Clementeen Graham

## 2021-02-04 ENCOUNTER — Other Ambulatory Visit: Payer: Self-pay

## 2021-02-04 DIAGNOSIS — Z79899 Other long term (current) drug therapy: Secondary | ICD-10-CM

## 2021-02-05 LAB — CBC WITH DIFFERENTIAL/PLATELET
Absolute Monocytes: 650 cells/uL (ref 200–950)
Basophils Absolute: 40 cells/uL (ref 0–200)
Basophils Relative: 0.6 %
Eosinophils Absolute: 141 cells/uL (ref 15–500)
Eosinophils Relative: 2.1 %
HCT: 38.4 % (ref 35.0–45.0)
Hemoglobin: 12.7 g/dL (ref 11.7–15.5)
Lymphs Abs: 1521 cells/uL (ref 850–3900)
MCH: 31.5 pg (ref 27.0–33.0)
MCHC: 33.1 g/dL (ref 32.0–36.0)
MCV: 95.3 fL (ref 80.0–100.0)
MPV: 10.1 fL (ref 7.5–12.5)
Monocytes Relative: 9.7 %
Neutro Abs: 4348 cells/uL (ref 1500–7800)
Neutrophils Relative %: 64.9 %
Platelets: 205 10*3/uL (ref 140–400)
RBC: 4.03 10*6/uL (ref 3.80–5.10)
RDW: 11.8 % (ref 11.0–15.0)
Total Lymphocyte: 22.7 %
WBC: 6.7 10*3/uL (ref 3.8–10.8)

## 2021-02-05 LAB — COMPLETE METABOLIC PANEL WITH GFR
AG Ratio: 1.6 (calc) (ref 1.0–2.5)
ALT: 20 U/L (ref 6–29)
AST: 20 U/L (ref 10–35)
Albumin: 4.2 g/dL (ref 3.6–5.1)
Alkaline phosphatase (APISO): 82 U/L (ref 37–153)
BUN: 16 mg/dL (ref 7–25)
CO2: 29 mmol/L (ref 20–32)
Calcium: 9.1 mg/dL (ref 8.6–10.4)
Chloride: 99 mmol/L (ref 98–110)
Creat: 0.68 mg/dL (ref 0.60–0.93)
GFR, Est African American: 100 mL/min/{1.73_m2} (ref >=60)
GFR, Est Non African American: 86 mL/min/{1.73_m2} (ref >=60)
Globulin: 2.6 g/dL (calc) (ref 1.9–3.7)
Glucose, Bld: 82 mg/dL (ref 65–99)
Potassium: 4.6 mmol/L (ref 3.5–5.3)
Sodium: 135 mmol/L (ref 135–146)
Total Bilirubin: 0.4 mg/dL (ref 0.2–1.2)
Total Protein: 6.8 g/dL (ref 6.1–8.1)

## 2021-02-06 ENCOUNTER — Other Ambulatory Visit: Payer: Self-pay

## 2021-02-06 ENCOUNTER — Ambulatory Visit (INDEPENDENT_AMBULATORY_CARE_PROVIDER_SITE_OTHER): Payer: Medicare Other | Admitting: Physician Assistant

## 2021-02-06 ENCOUNTER — Encounter: Payer: Self-pay | Admitting: Physician Assistant

## 2021-02-06 VITALS — BP 113/72 | HR 62 | Resp 14 | Ht 61.5 in | Wt 109.8 lb

## 2021-02-06 DIAGNOSIS — Z79899 Other long term (current) drug therapy: Secondary | ICD-10-CM

## 2021-02-06 DIAGNOSIS — M19041 Primary osteoarthritis, right hand: Secondary | ICD-10-CM

## 2021-02-06 DIAGNOSIS — M3509 Sicca syndrome with other organ involvement: Secondary | ICD-10-CM

## 2021-02-06 DIAGNOSIS — Z8639 Personal history of other endocrine, nutritional and metabolic disease: Secondary | ICD-10-CM

## 2021-02-06 DIAGNOSIS — I7300 Raynaud's syndrome without gangrene: Secondary | ICD-10-CM

## 2021-02-06 DIAGNOSIS — M8589 Other specified disorders of bone density and structure, multiple sites: Secondary | ICD-10-CM

## 2021-02-06 DIAGNOSIS — M19042 Primary osteoarthritis, left hand: Secondary | ICD-10-CM

## 2021-02-06 DIAGNOSIS — Z8739 Personal history of other diseases of the musculoskeletal system and connective tissue: Secondary | ICD-10-CM

## 2021-02-06 DIAGNOSIS — M4802 Spinal stenosis, cervical region: Secondary | ICD-10-CM

## 2021-02-06 DIAGNOSIS — M0579 Rheumatoid arthritis with rheumatoid factor of multiple sites without organ or systems involvement: Secondary | ICD-10-CM | POA: Diagnosis not present

## 2021-02-06 DIAGNOSIS — Z8719 Personal history of other diseases of the digestive system: Secondary | ICD-10-CM

## 2021-02-06 NOTE — Patient Instructions (Signed)
Hand Exercises Hand exercises can be helpful for almost anyone. These exercises can strengthen the hands, improve flexibility and movement, and increase blood flow to the hands. These results can make work and daily tasks easier. Hand exercises can be especially helpful for people who have joint pain from arthritis or have nerve damage from overuse (carpal tunnel syndrome). These exercises can also help people who have injured a hand. Exercises Most of these hand exercises are gentle stretching and motion exercises. It is usually safe to do them often throughout the day. Warming up your hands before exercise may help to reduce stiffness. You can do this with gentle massage orby placing your hands in warm water for 10-15 minutes. It is normal to feel some stretching, pulling, tightness, or mild discomfort as you begin new exercises. This will gradually improve. Stop an exercise right away if you feel sudden, severe pain or your pain gets worse. Ask your healthcare provider which exercises are best for you. Knuckle bend or "claw" fist Stand or sit with your arm, hand, and all five fingers pointed straight up. Make sure to keep your wrist straight during the exercise. Gently bend your fingers down toward your palm until the tips of your fingers are touching the top of your palm. Keep your big knuckle straight and just bend the small knuckles in your fingers. Hold this position for __________ seconds. Straighten (extend) your fingers back to the starting position. Repeat this exercise 5-10 times with each hand. Full finger fist Stand or sit with your arm, hand, and all five fingers pointed straight up. Make sure to keep your wrist straight during the exercise. Gently bend your fingers into your palm until the tips of your fingers are touching the middle of your palm. Hold this position for __________ seconds. Extend your fingers back to the starting position, stretching every joint fully. Repeat this  exercise 5-10 times with each hand. Straight fist Stand or sit with your arm, hand, and all five fingers pointed straight up. Make sure to keep your wrist straight during the exercise. Gently bend your fingers at the big knuckle, where your fingers meet your hand, and the middle knuckle. Keep the knuckle at the tips of your fingers straight and try to touch the bottom of your palm. Hold this position for __________ seconds. Extend your fingers back to the starting position, stretching every joint fully. Repeat this exercise 5-10 times with each hand. Tabletop Stand or sit with your arm, hand, and all five fingers pointed straight up. Make sure to keep your wrist straight during the exercise. Gently bend your fingers at the big knuckle, where your fingers meet your hand, as far down as you can while keeping the small knuckles in your fingers straight. Think of forming a tabletop with your fingers. Hold this position for __________ seconds. Extend your fingers back to the starting position, stretching every joint fully. Repeat this exercise 5-10 times with each hand. Finger spread Place your hand flat on a table with your palm facing down. Make sure your wrist stays straight as you do this exercise. Spread your fingers and thumb apart from each other as far as you can until you feel a gentle stretch. Hold this position for __________ seconds. Bring your fingers and thumb tight together again. Hold this position for __________ seconds. Repeat this exercise 5-10 times with each hand. Making circles Stand or sit with your arm, hand, and all five fingers pointed straight up. Make sure to keep your   wrist straight during the exercise. Make a circle by touching the tip of your thumb to the tip of your index finger. Hold for __________ seconds. Then open your hand wide. Repeat this motion with your thumb and each finger on your hand. Repeat this exercise 5-10 times with each hand. Thumb motion Sit  with your forearm resting on a table and your wrist straight. Your thumb should be facing up toward the ceiling. Keep your fingers relaxed as you move your thumb. Lift your thumb up as high as you can toward the ceiling. Hold for __________ seconds. Bend your thumb across your palm as far as you can, reaching the tip of your thumb for the small finger (pinkie) side of your palm. Hold for __________ seconds. Repeat this exercise 5-10 times with each hand. Grip strengthening  Hold a stress ball or other soft ball in the middle of your hand. Slowly increase the pressure, squeezing the ball as much as you can without causing pain. Think of bringing the tips of your fingers into the middle of your palm. All of your finger joints should bend when doing this exercise. Hold your squeeze for __________ seconds, then relax. Repeat this exercise 5-10 times with each hand. Contact a health care provider if: Your hand pain or discomfort gets much worse when you do an exercise. Your hand pain or discomfort does not improve within 2 hours after you exercise. If you have any of these problems, stop doing these exercises right away. Do not do them again unless your health care provider says that you can. Get help right away if: You develop sudden, severe hand pain or swelling. If this happens, stop doing these exercises right away. Do not do them again unless your health care provider says that you can. This information is not intended to replace advice given to you by your health care provider. Make sure you discuss any questions you have with your healthcare provider. Document Revised: 11/17/2018 Document Reviewed: 07/28/2018 Elsevier Patient Education  2022 Elsevier Inc.  

## 2021-02-13 ENCOUNTER — Other Ambulatory Visit: Payer: Self-pay | Admitting: Rheumatology

## 2021-02-13 DIAGNOSIS — Z1231 Encounter for screening mammogram for malignant neoplasm of breast: Secondary | ICD-10-CM

## 2021-02-16 ENCOUNTER — Other Ambulatory Visit: Payer: Self-pay | Admitting: Physician Assistant

## 2021-02-16 DIAGNOSIS — M0579 Rheumatoid arthritis with rheumatoid factor of multiple sites without organ or systems involvement: Secondary | ICD-10-CM

## 2021-02-17 NOTE — Telephone Encounter (Signed)
Last Visit: 02/06/2021 Next Visit: 07/09/2021 Labs: 02/04/2021 CBC and CMP WNL Eye exam: 09/05/2020   Current Dose per office note on 02/06/2021: Plaquenil 200 mg 1 tablet by mouth daily. DX: Rheumatoid arthritis involving multiple sites with positive rheumatoid factor  Last Fill: 12/09/2020  Okay to refill Plaquenil?

## 2021-04-08 ENCOUNTER — Ambulatory Visit
Admission: RE | Admit: 2021-04-08 | Discharge: 2021-04-08 | Disposition: A | Discharge status: Home / Self Care | Payer: Medicare Other | Source: Ambulatory Visit | Attending: Rheumatology | Admitting: Rheumatology

## 2021-04-08 ENCOUNTER — Other Ambulatory Visit: Payer: Self-pay

## 2021-04-08 DIAGNOSIS — Z1231 Encounter for screening mammogram for malignant neoplasm of breast: Secondary | ICD-10-CM

## 2021-05-16 ENCOUNTER — Other Ambulatory Visit: Payer: Self-pay | Admitting: Physician Assistant

## 2021-05-16 DIAGNOSIS — M0579 Rheumatoid arthritis with rheumatoid factor of multiple sites without organ or systems involvement: Secondary | ICD-10-CM

## 2021-05-16 NOTE — Telephone Encounter (Signed)
Next Visit: 07/09/2021  Last Visit: 02/06/2021  Labs: 02/04/2021 CBC and CMP WNL  Eye exam: 09/05/2020   Current Dose per office note on 02/06/2021: Plaquenil 200 mg 1 tablet by mouth daily.  DX: Rheumatoid arthritis involving multiple sites with positive rheumatoid factor   Last Fill: 02/17/2021  Okay to refill Plaquenil?

## 2021-06-25 NOTE — Progress Notes (Signed)
Office Visit Note  Patient: Linda Berg             Date of Birth: April 29, 1946           MRN: [id number]             PCP: Aletha Halim., PA-C Referring: Aletha Halim., PA-C Visit Date: 07/09/2021 Occupation: @GUAROCC @  Subjective:  Medication management.   History of Present Illness: Linda Berg is a 75 y.o. female with a history of rheumatoid arthritis and osteoarthritis.  She states she has been having increased pain and discomfort in her hands and difficulty making complete fist.  She also has some discomfort in her feet.  She notices edema in her right lower extremity.  She continues to have some stiffness in her neck.  Her Raynauds is active with the weather change.  She continues to have dry mouth and dry eye symptoms.  She has been using Restasis and over-the-counter medications.  Activities of Daily Living:  Patient reports morning stiffness for all day. Patient Reports nocturnal pain.  Difficulty dressing/grooming: Denies Difficulty climbing stairs: Denies Difficulty getting out of chair: Denies Difficulty using hands for taps, buttons, cutlery, and/or writing: Denies  Review of Systems  Constitutional:  Positive for fatigue.  HENT:  Positive for mouth dryness and nose dryness. Negative for mouth sores.   Eyes:  Positive for dryness. Negative for pain and itching.  Respiratory:  Negative for shortness of breath and difficulty breathing.   Cardiovascular:  Negative for chest pain and palpitations.  Gastrointestinal:  Positive for constipation. Negative for blood in stool and diarrhea.  Endocrine: Negative for increased urination.  Genitourinary:  Negative for difficulty urinating.  Musculoskeletal:  Positive for joint pain, joint pain, joint swelling, myalgias, morning stiffness, muscle tenderness and myalgias.  Skin:  Positive for color change. Negative for rash, redness and sensitivity to sunlight.  Allergic/Immunologic:  Negative for susceptible to infections.  Neurological:  Positive for numbness. Negative for dizziness, headaches, memory loss and weakness.  Hematological:  Positive for bruising/bleeding tendency.  Psychiatric/Behavioral:  Negative for confusion.    PMFS History:  Patient Active Problem List   Diagnosis Date Noted   Language difficulty 08/17/2018   Nonintractable headache 08/17/2018   Upper airway cough syndrome 10/13/2017   Pneumomediastinum (HCC)    Pneumoperitoneum    Acute respiratory failure (HCC)    Pneumothorax, traumatic    Anaphylaxis 08/17/2017   Trochanteric bursitis of both hips 03/16/2017   Rheumatoid arthritis involving multiple sites with positive rheumatoid factor ([place]) 10/09/2016   Sicca syndrome, unspecified 10/09/2016   Osteopenia of multiple sites 10/09/2016   History of scoliosis 10/09/2016   History of gastroesophageal reflux (GERD) 10/09/2016   History of hypothyroidism 10/09/2016   History of hyperlipidemia 10/09/2016   High risk medication use 10/09/2016   Primary osteoarthritis of both hands 10/09/2016   Paresthesia 10/11/2014   Hyperlipidemia 10/11/2014   Small vessel disease, 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/11/2014   Tinnitus 09/17/2014   Abdominal pain 09/05/2014   Acute cystitis 09/05/2014   Arthritis 09/05/2014   Cough 09/05/2014   Esophagitis 09/05/2014   Facial numbness 09/05/2014   Difficulty hearing 09/05/2014   H/O disease 09/05/2014   HLD (hyperlipidemia) 09/05/2014   Muscle ache 09/05/2014   Symptoms involving urinary system 09/05/2014   Awareness of heartbeats 09/05/2014   Acne erythematosa 09/05/2014   Absence of bladder continence 09/05/2014   Urgency of micturation 09/05/2014   Routine general medical examination at a health care  facility 09/05/2014   DYSPHAGIA 01/09/2009   HYPOTHYROIDISM 11/04/2007   DYSLIPIDEMIA 11/04/2007   GERD 11/04/2007   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/04/2007   DUODENITIS, WITHOUT HEMORRHAGE 11/04/2007   HIATAL HERNIA  11/04/2007   DIVERTICULOSIS, COLON 11/04/2007   CONSTIPATION, CHRONIC 11/04/2007   RECTAL BLEEDING 11/04/2007    Past Medical History:  Diagnosis Date   Barrett esophagus    Diverticulosis    Dyslipidemia    Facial numbness    GERD (gastroesophageal reflux disease)    Hearing loss    Hiatal hernia    Hypothyroidism    Rheumatoid arteritis (HCC)     Family History  Problem Relation Age of Onset   Heart disease Mother    Rheumatic fever Mother    Heart failure Mother    Stroke Father    Diabetes Father    Heart failure Father    Heart disease Maternal Aunt        aunt x 2   Irritable bowel syndrome Son    GER disease Son    Colon cancer Neg Hx    Past Surgical History:  Procedure Laterality Date   CATARACT EXTRACTION, BILATERAL     04/13/2019, 04/27/2019   CHEST TUBE INSERTION  08/17/2017   COLONOSCOPY  08/17/2017   HAMMER TOE SURGERY Left    INTRAUTERINE DEVICE INSERTION     IUD REMOVAL     KNEE ARTHROPLASTY     KNEE ARTHROSCOPY  12/25/11   left   LEFT OOPHORECTOMY  03/13/98   MANDIBLE SURGERY  9/93   pallete expansion   TONSILLECTOMY     TUBAL LIGATION     Social History   Social History Narrative   Live with husband at home.   Left-handed.   1-2 cups per day.   Immunization History  Administered Date(s) Administered   Influenza, High Dose Seasonal PF 04/29/2016, 05/03/2017   Influenza-Unspecified 05/21/2010, 04/28/2013, 05/17/2014, 04/30/2015, 05/03/2017, 05/10/2018   PFIZER(Purple Top)SARS-COV-2 Vaccination 09/02/2019, 09/23/2019, 05/06/2020   Pneumococcal Conjugate-13 03/29/2014   Pneumococcal Polysaccharide-23 05/21/2010, 09/05/2018   Zoster Recombinat (Shingrix) 12/10/2017, 04/08/2018   Zoster, Live 10/19/2007     Objective: Vital Signs: BP 112/70 (BP Location: Left Arm, Patient Position: Sitting, Cuff Size: Normal)   Pulse 67   Ht 5' 1.5" (1.562 m)   Wt 113 lb 12.8 oz (51.6 kg)   BMI 21.15 kg/m    Physical Exam Vitals and nursing note  reviewed.  Constitutional:      Appearance: She is well-developed.  HENT:     Head: Normocephalic and atraumatic.  Eyes:     Conjunctiva/sclera: Conjunctivae normal.  Cardiovascular:     Rate and Rhythm: Normal rate and regular rhythm.     Heart sounds: Normal heart sounds.  Pulmonary:     Effort: Pulmonary effort is normal.     Breath sounds: Normal breath sounds.  Abdominal:     General: Bowel sounds are normal.     Palpations: Abdomen is soft.  Musculoskeletal:     Cervical back: Normal range of motion.  Lymphadenopathy:     Cervical: No cervical adenopathy.  Skin:    General: Skin is warm and dry.     Capillary Refill: Capillary refill takes less than 2 seconds.  Neurological:     Mental Status: She is alert and oriented to person, place, and time.  Psychiatric:        Behavior: Behavior normal.     Musculoskeletal Exam: C-spine was in good range of motion.  Shoulder joints,  elbow joints, wrist joints with good range of motion.  She had no synovitis of her wrist joints and MCPs.  She had bilateral PIP and DIP thickening with incomplete fist formation.  Hip joints and knee joints with good range of motion.  She had no tenderness over ankles or MTPs.  CDAI Exam: CDAI Score: 0.4  Patient Global: 2 mm; Provider Global: 2 mm Swollen: 0 ; Tender: 0  Joint Exam 07/09/2021   No joint exam has been documented for this visit   There is currently no information documented on the homunculus. Go to the Rheumatology activity and complete the homunculus joint exam.  Investigation: No additional findings.  Imaging: No results found.  Recent Labs: Lab Results  Component Value Date   WBC 4.9 07/07/2021   HGB 13.1 07/07/2021   PLT 214 07/07/2021   NA 139 07/07/2021   K 4.6 07/07/2021   CL 101 07/07/2021   CO2 30 07/07/2021   GLUCOSE 84 07/07/2021   BUN 22 07/07/2021   CREATININE 0.68 07/07/2021   BILITOT 0.3 07/07/2021   ALKPHOS 84 12/07/2019   AST 19 07/07/2021   ALT  18 07/07/2021   PROT 6.9 07/07/2021   ALBUMIN 4.4 12/07/2019   CALCIUM 9.6 07/07/2021   GFRAA 100 02/04/2021    Speciality Comments: PLQ eye exam: 09/05/2020 WNL @ [facility] Follow up in 1 year  Procedures:  No procedures performed Allergies: Ciprofloxacin, Terbinafine, and Lamisil at [b.f.i.]   Assessment / Plan:     Visit Diagnoses: Rheumatoid arthritis involving multiple sites with positive rheumatoid factor (HCC)-she complains of increased pain and discomfort in her bilateral hands.  No synovitis was noted.  High risk medication use - Plaquenil 200 mg 1 tablet by mouth daily. PLQ eye exam: 09/05/2020.  Her labs from July 07, 2021 were reviewed which were within normal limits.  Eye examination from September 05, 2020 was normal.  She has been advised to get labs every 5 months and annual eye examination was advised.  Sjogren's syndrome with other organ involvement ([place]) - Repeat ANA negative (03/27/20), Ro+, La-: She is on Restasis and over-the-counter products.  Raynaud's syndrome without gangrene-warm clothing and keeping core temperature warm was discussed.  Primary osteoarthritis of both hands-she has severe osteoarthritis in her hands with incomplete fist formation.  A handout on hand exercises was given.  Spinal stenosis of cervical region-she had some stiffness range of motion of her cervical spine.  Osteopenia of multiple sites - DEXA performed on 05/01/19: 1/3 right distal radius BMD 0.551 with T-score -2.4 she is taking calcium and vitamin D.  Other medical problems are listed as follows:  History of hyperlipidemia  History of hypothyroidism  History of scoliosis  History of gastroesophageal reflux (GERD)  History of diverticulitis  Orders: No orders of the defined types were placed in this encounter.  No orders of the defined types were placed in this encounter.    Follow-Up Instructions: Return in about 5 months (around 12/07/2021) for Rheumatoid  arthritis, Osteoarthritis.   Bo Merino, MD  Note - This record has been created using Editor, commissioning.  Chart creation errors have been sought, but may not always  have been located. Such creation errors do not reflect on  the standard of medical care.

## 2021-07-07 ENCOUNTER — Other Ambulatory Visit: Payer: Self-pay | Admitting: *Deleted

## 2021-07-07 DIAGNOSIS — Z79899 Other long term (current) drug therapy: Secondary | ICD-10-CM

## 2021-07-07 LAB — CBC WITH DIFFERENTIAL/PLATELET
Absolute Monocytes: 470 cells/uL (ref 200–950)
Basophils Absolute: 29 cells/uL (ref 0–200)
Basophils Relative: 0.6 %
Eosinophils Absolute: 78 cells/uL (ref 15–500)
Eosinophils Relative: 1.6 %
HCT: 39.1 % (ref 35.0–45.0)
Hemoglobin: 13.1 g/dL (ref 11.7–15.5)
Lymphs Abs: 1431 cells/uL (ref 850–3900)
MCH: 32.3 pg (ref 27.0–33.0)
MCHC: 33.5 g/dL (ref 32.0–36.0)
MCV: 96.3 fL (ref 80.0–100.0)
MPV: 10.6 fL (ref 7.5–12.5)
Monocytes Relative: 9.6 %
Neutro Abs: 2891 cells/uL (ref 1500–7800)
Neutrophils Relative %: 59 %
Platelets: 214 10*3/uL (ref 140–400)
RBC: 4.06 10*6/uL (ref 3.80–5.10)
RDW: 11.2 % (ref 11.0–15.0)
Total Lymphocyte: 29.2 %
WBC: 4.9 10*3/uL (ref 3.8–10.8)

## 2021-07-07 LAB — COMPLETE METABOLIC PANEL WITH GFR
AG Ratio: 1.8 (calc) (ref 1.0–2.5)
ALT: 18 U/L (ref 6–29)
AST: 19 U/L (ref 10–35)
Albumin: 4.4 g/dL (ref 3.6–5.1)
Alkaline phosphatase (APISO): 79 U/L (ref 37–153)
BUN: 22 mg/dL (ref 7–25)
CO2: 30 mmol/L (ref 20–32)
Calcium: 9.6 mg/dL (ref 8.6–10.4)
Chloride: 101 mmol/L (ref 98–110)
Creat: 0.68 mg/dL (ref 0.60–1.00)
Globulin: 2.5 g/dL (calc) (ref 1.9–3.7)
Glucose, Bld: 84 mg/dL (ref 65–99)
Potassium: 4.6 mmol/L (ref 3.5–5.3)
Sodium: 139 mmol/L (ref 135–146)
Total Bilirubin: 0.3 mg/dL (ref 0.2–1.2)
Total Protein: 6.9 g/dL (ref 6.1–8.1)
eGFR: 91 mL/min/{1.73_m2} (ref >=60)

## 2021-07-08 NOTE — Progress Notes (Signed)
CBC and CMP are normal.

## 2021-07-09 ENCOUNTER — Ambulatory Visit: Payer: Medicare Other | Admitting: Rheumatology

## 2021-07-09 ENCOUNTER — Other Ambulatory Visit: Payer: Self-pay

## 2021-07-09 ENCOUNTER — Encounter: Payer: Self-pay | Admitting: Rheumatology

## 2021-07-09 VITALS — BP 112/70 | HR 67 | Ht 61.5 in | Wt 113.8 lb

## 2021-07-09 DIAGNOSIS — M0579 Rheumatoid arthritis with rheumatoid factor of multiple sites without organ or systems involvement: Secondary | ICD-10-CM | POA: Diagnosis not present

## 2021-07-09 DIAGNOSIS — M3509 Sicca syndrome with other organ involvement: Secondary | ICD-10-CM

## 2021-07-09 DIAGNOSIS — M19042 Primary osteoarthritis, left hand: Secondary | ICD-10-CM

## 2021-07-09 DIAGNOSIS — Z8739 Personal history of other diseases of the musculoskeletal system and connective tissue: Secondary | ICD-10-CM

## 2021-07-09 DIAGNOSIS — M19041 Primary osteoarthritis, right hand: Secondary | ICD-10-CM

## 2021-07-09 DIAGNOSIS — M8589 Other specified disorders of bone density and structure, multiple sites: Secondary | ICD-10-CM

## 2021-07-09 DIAGNOSIS — Z79899 Other long term (current) drug therapy: Secondary | ICD-10-CM | POA: Diagnosis not present

## 2021-07-09 DIAGNOSIS — Z8639 Personal history of other endocrine, nutritional and metabolic disease: Secondary | ICD-10-CM

## 2021-07-09 DIAGNOSIS — M4802 Spinal stenosis, cervical region: Secondary | ICD-10-CM

## 2021-07-09 DIAGNOSIS — Z8719 Personal history of other diseases of the digestive system: Secondary | ICD-10-CM

## 2021-07-09 DIAGNOSIS — I7300 Raynaud's syndrome without gangrene: Secondary | ICD-10-CM | POA: Diagnosis not present

## 2021-07-09 NOTE — Patient Instructions (Addendum)
Hand Exercises Hand exercises can be helpful for almost anyone. These exercises can strengthen the hands, improve flexibility and movement, and increase blood flow to the hands. These results can make work and daily tasks easier. Hand exercises can be especially helpful for people who have joint pain from arthritis or have nerve damage from overuse (carpal tunnel syndrome). These exercises can also help people who have injured a hand. Exercises Most of these hand exercises are gentle stretching and motion exercises. It is usually safe to do them often throughout the day. Warming up your hands before exercise may help to reduce stiffness. You can do this with gentle massage or by placing your hands in warm water for 10-15 minutes. It is normal to feel some stretching, pulling, tightness, or mild discomfort as you begin new exercises. This will gradually improve. Stop an exercise right away if you feel sudden, severe pain or your pain gets worse. Ask your health care provider which exercises are best for you. Knuckle bend or "claw" fist  Stand or sit with your arm, hand, and all five fingers pointed straight up. Make sure to keep your wrist straight during the exercise. Gently bend your fingers down toward your palm until the tips of your fingers are touching the top of your palm. Keep your big knuckle straight and just bend the small knuckles in your fingers. Hold this position for __________ seconds. Straighten (extend) your fingers back to the starting position. Repeat this exercise 5-10 times with each hand. Full finger fist  Stand or sit with your arm, hand, and all five fingers pointed straight up. Make sure to keep your wrist straight during the exercise. Gently bend your fingers into your palm until the tips of your fingers are touching the middle of your palm. Hold this position for __________ seconds. Extend your fingers back to the starting position, stretching every joint fully. Repeat  this exercise 5-10 times with each hand. Straight fist Stand or sit with your arm, hand, and all five fingers pointed straight up. Make sure to keep your wrist straight during the exercise. Gently bend your fingers at the big knuckle, where your fingers meet your hand, and the middle knuckle. Keep the knuckle at the tips of your fingers straight and try to touch the bottom of your palm. Hold this position for __________ seconds. Extend your fingers back to the starting position, stretching every joint fully. Repeat this exercise 5-10 times with each hand. Tabletop  Stand or sit with your arm, hand, and all five fingers pointed straight up. Make sure to keep your wrist straight during the exercise. Gently bend your fingers at the big knuckle, where your fingers meet your hand, as far down as you can while keeping the small knuckles in your fingers straight. Think of forming a tabletop with your fingers. Hold this position for __________ seconds. Extend your fingers back to the starting position, stretching every joint fully. Repeat this exercise 5-10 times with each hand. Finger spread  Place your hand flat on a table with your palm facing down. Make sure your wrist stays straight as you do this exercise. Spread your fingers and thumb apart from each other as far as you can until you feel a gentle stretch. Hold this position for __________ seconds. Bring your fingers and thumb tight together again. Hold this position for __________ seconds. Repeat this exercise 5-10 times with each hand. Making circles  Stand or sit with your arm, hand, and all five fingers pointed   straight up. Make sure to keep your wrist straight during the exercise. Make a circle by touching the tip of your thumb to the tip of your index finger. Hold for __________ seconds. Then open your hand wide. Repeat this motion with your thumb and each finger on your hand. Repeat this exercise 5-10 times with each hand. Thumb  motion  Sit with your forearm resting on a table and your wrist straight. Your thumb should be facing up toward the ceiling. Keep your fingers relaxed as you move your thumb. Lift your thumb up as high as you can toward the ceiling. Hold for __________ seconds. Bend your thumb across your palm as far as you can, reaching the tip of your thumb for the small finger (pinkie) side of your palm. Hold for __________ seconds. Repeat this exercise 5-10 times with each hand. Grip strengthening  Hold a stress ball or other soft ball in the middle of your hand. Slowly increase the pressure, squeezing the ball as much as you can without causing pain. Think of bringing the tips of your fingers into the middle of your palm. All of your finger joints should bend when doing this exercise. Hold your squeeze for __________ seconds, then relax. Repeat this exercise 5-10 times with each hand. Contact a health care provider if: Your hand pain or discomfort gets much worse when you do an exercise. Your hand pain or discomfort does not improve within 2 hours after you exercise. If you have any of these problems, stop doing these exercises right away. Do not do them again unless your health care provider says that you can. Get help right away if: You develop sudden, severe hand pain or swelling. If this happens, stop doing these exercises right away. Do not do them again unless your health care provider says that you can. This information is not intended to replace advice given to you by your health care provider. Make sure you discuss any questions you have with your health care provider. Document Revised: 11/14/2020 Document Reviewed: 11/14/2020 Elsevier Patient Education  2022 Elsevier Inc.  Standing Labs We placed an order today for your standing lab work.   Please have your standing labs drawn in April  If possible, please have your labs drawn 2 weeks prior to your appointment so that the provider can  discuss your results at your appointment.  Please note that you may see your imaging and lab results in MyChart before we have reviewed them. We may be awaiting multiple results to interpret others before contacting you. Please allow our office up to 72 hours to thoroughly review all of the results before contacting the office for clarification of your results.  We have open lab daily: Monday through Thursday from 1:30-4:30 PM and Friday from 1:30-4:00 PM at the office of Dr. Pollyann Savoy, [facility].   Please be advised, all patients with office appointments requiring lab work will take precedent over walk-in lab work.  If possible, please come for your lab work on Monday and Friday afternoons, as you may experience shorter wait times. The office is located at [address], [place], [place] [address] No appointment is necessary.   Labs are drawn by [facility]. Please bring your co-pay at the time of your lab draw.  You may receive a bill from [facility] for your lab work.  If you wish to have your labs drawn at another location, please call the office 24 hours in advance to send orders.  If you have any questions regarding directions or hours of operation,  please call [phone].   As a reminder, please drink plenty of water prior to coming for your lab work. Thanks!

## 2021-07-24 ENCOUNTER — Other Ambulatory Visit: Payer: Self-pay | Admitting: Physician Assistant

## 2021-07-24 DIAGNOSIS — M0579 Rheumatoid arthritis with rheumatoid factor of multiple sites without organ or systems involvement: Secondary | ICD-10-CM

## 2021-07-24 NOTE — Telephone Encounter (Signed)
Next Visit: 12/03/2021  Last Visit: 07/09/2021  Labs: 07/07/2021 CBC and CMP are normal.  Eye exam: 09/05/2020 WNL    Current Dose per office note 07/09/2021: Plaquenil 200 mg 1 tablet by mouth daily  XT:[id number] arthritis involving multiple sites with positive rheumatoid factor  Last Fill: 05/19/2021  Okay to refill Plaquenil?

## 2021-09-16 ENCOUNTER — Encounter: Payer: Self-pay | Admitting: Rheumatology

## 2021-09-17 ENCOUNTER — Telehealth: Payer: Self-pay | Admitting: *Deleted

## 2021-09-17 NOTE — Telephone Encounter (Signed)
Patient advised of results and advised it will be discussed in detail at the follow up visit. Patient advised we will discuss treatment options. Patient expressed understanding.

## 2021-09-17 NOTE — Telephone Encounter (Signed)
Received DEXA results from [facility].  Date of Scan: 09/15/2021  Lowest T-score:-2.6  BMD:0.537  Lowest site measured:Left Radius  DX: Osteoporosis   Significant changes in BMD and site measured (5% and above):-7% Right Total Femur, -7% Right Total Femur.   Current Regimen:Calcium and Vitamin D  Recommendation:-7% change in BMD of right and left femur. Patient will require treatment which we can discuss at follow up.   Reviewed by:Sherron Ales, PA-C  Next Appointment:  12/03/2021

## 2021-10-24 ENCOUNTER — Other Ambulatory Visit: Payer: Self-pay | Admitting: Physician Assistant

## 2021-10-24 DIAGNOSIS — M0579 Rheumatoid arthritis with rheumatoid factor of multiple sites without organ or systems involvement: Secondary | ICD-10-CM

## 2021-10-24 NOTE — Telephone Encounter (Signed)
Next Visit: 12/03/2021 ?  ?Last Visit: 07/09/2021 ?  ?Labs: 09/02/2021 CO2 32, WBC 3.0, RBC 3.97, MCV 97.5, Neutrophil Absolute 1.2 ?  ?Eye exam: 09/05/2020 WNL   ?  ?Current Dose per office note 07/09/2021: Plaquenil 200 mg 1 tablet by mouth daily ?  ?[id number] arthritis involving multiple sites with positive rheumatoid factor ?  ?Last Fill: 07/24/2021 ?  ?Okay to refill Plaquenil?  ?

## 2021-11-19 NOTE — Progress Notes (Signed)
? ?Office Visit Note ? ?Patient: Linda Berg             ?Date of Birth: November 10, 1945           ?MRN: [id number]             ?PCP: Aletha Halim., PA-C ?Referring: Aletha Halim., PA-C ?Visit Date: 12/03/2021 ?Occupation: @GUAROCC @ ? ?Subjective:  ?Discuss DEXA ? ?History of Present Illness: Linda Berg is a 76 y.o. female with history of seropositive rheumatoid arthritis, sjogren's syndrome, and osteoarthritis.  She is taking Plaquenil 200 mg 1 tablet by mouth daily.  She is tolerating Plaquenil without any side effects and has not missed any doses recently.  She denies any signs or symptoms of a rheumatoid arthritis flare.  She continues to experience stiffness in both hands on a daily basis due to underlying osteoarthritis.  She denies any joint swelling at this time.  She has noticed some edema in her lower legs especially in the evenings.  She continues to have chronic sicca symptoms and uses Restasis on a daily basis for symptomatic relief.  She had a Plaquenil eye examination in February 2023. ? ?Patient presents today to discuss recent DEXA results from 09/15/2021.  She is na?ve to osteoporosis treatment.  She has been taking a calcium and vitamin D supplement daily.  She experiences constipation when taking calcium supplements.  She denies any recent falls or fractures.  She states that she has upcoming dental work and will be having a crown placed. ? ?Patient reports that she has recently had more frequent episodes of palpitations.  She has been concerned about possible A-fib.  She has not followed up with her PCP and does not see a cardiologist. ? ?Activities of Daily Living:  ?Patient reports morning stiffness for all day. ?Patient Reports nocturnal pain.  ?Difficulty dressing/grooming: Denies ?Difficulty climbing stairs: Denies ?Difficulty getting out of chair: Denies ?Difficulty using hands for taps, buttons, cutlery, and/or writing: Denies ? ?Review of Systems   ?Constitutional:  Negative for fatigue.  ?HENT:  Positive for mouth dryness and nose dryness. Negative for mouth sores.   ?Eyes:  Positive for dryness. Negative for pain and itching.  ?Respiratory:  Negative for shortness of breath and difficulty breathing.   ?Cardiovascular:  Positive for palpitations. Negative for chest pain.  ?Gastrointestinal:  Positive for constipation. Negative for blood in stool and diarrhea.  ?Endocrine: Negative for increased urination.  ?Genitourinary:  Negative for difficulty urinating.  ?Musculoskeletal:  Positive for morning stiffness. Negative for joint pain, joint pain, joint swelling, myalgias, muscle tenderness and myalgias.  ?Skin:  Positive for color change. Negative for rash and redness.  ?Allergic/Immunologic: Negative for susceptible to infections.  ?Neurological:  Positive for numbness. Negative for dizziness, headaches, memory loss and weakness.  ?Hematological:  Positive for bruising/bleeding tendency.  ?Psychiatric/Behavioral:  Negative for confusion.   ? ?PMFS History:  ?Patient Active Problem List  ? Diagnosis Date Noted  ? Language difficulty 08/17/2018  ? Nonintractable headache 08/17/2018  ? Upper airway cough syndrome 10/13/2017  ? Pneumomediastinum ([place])   ? Pneumoperitoneum   ? Acute respiratory failure ([place])   ? Pneumothorax, traumatic   ? Anaphylaxis 08/17/2017  ? Trochanteric bursitis of both hips 03/16/2017  ? Rheumatoid arthritis involving multiple sites with positive rheumatoid factor ([place]) 10/09/2016  ? Sicca syndrome, unspecified 10/09/2016  ? Osteopenia of multiple sites 10/09/2016  ? History of scoliosis 10/09/2016  ? History of gastroesophageal reflux (GERD) 10/09/2016  ? History  of hypothyroidism 10/09/2016  ? History of hyperlipidemia 10/09/2016  ? High risk medication use 10/09/2016  ? Primary osteoarthritis of both hands 10/09/2016  ? Paresthesia 10/11/2014  ? Hyperlipidemia 10/11/2014  ? Small vessel disease, 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/11/2014  ? Tinnitus  09/17/2014  ? Abdominal pain 09/05/2014  ? Acute cystitis 09/05/2014  ? Arthritis 09/05/2014  ? Cough 09/05/2014  ? Esophagitis 09/05/2014  ? Facial numbness 09/05/2014  ? Difficulty hearing 09/05/2014  ? H/O disease 09/05/2014  ? HLD (hyperlipidemia) 09/05/2014  ? Muscle ache 09/05/2014  ? Symptoms involving urinary system 09/05/2014  ? Awareness of heartbeats 09/05/2014  ? Acne erythematosa 09/05/2014  ? Absence of bladder continence 09/05/2014  ? Urgency of micturation 09/05/2014  ? Routine general medical examination at a health care facility 09/05/2014  ? DYSPHAGIA 01/09/2009  ? HYPOTHYROIDISM 11/04/2007  ? DYSLIPIDEMIA 11/04/2007  ? GERD 11/04/2007  ? 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/04/2007  ? DUODENITIS, WITHOUT HEMORRHAGE 11/04/2007  ? HIATAL HERNIA 11/04/2007  ? DIVERTICULOSIS, COLON 11/04/2007  ? CONSTIPATION, CHRONIC 11/04/2007  ? RECTAL BLEEDING 11/04/2007  ?  ?Past Medical History:  ?Diagnosis Date  ? Barrett esophagus   ? Diverticulosis   ? Dyslipidemia   ? Facial numbness   ? GERD (gastroesophageal reflux disease)   ? Hearing loss   ? Hiatal hernia   ? Hypothyroidism   ? Rheumatoid arteritis ([place])   ?  ?Family History  ?Problem Relation Age of Onset  ? Heart disease Mother   ? Rheumatic fever Mother   ? Heart failure Mother   ? Stroke Father   ? Diabetes Father   ? Heart failure Father   ? Heart disease Maternal Aunt   ?     aunt x 2  ? Irritable bowel syndrome Son   ? GER disease Son   ? Colon cancer Neg Hx   ? ?Past Surgical History:  ?Procedure Laterality Date  ? CATARACT EXTRACTION, BILATERAL    ? 04/13/2019, 04/27/2019  ? CHEST TUBE INSERTION  08/17/2017  ? COLONOSCOPY  08/17/2017  ? HAMMER TOE SURGERY Left   ? INTRAUTERINE DEVICE INSERTION    ? IUD REMOVAL    ? KNEE ARTHROPLASTY    ? KNEE ARTHROSCOPY  12/25/11  ? left  ? LEFT OOPHORECTOMY  03/13/98  ? MANDIBLE SURGERY  9/93  ? pallete expansion  ? TONSILLECTOMY    ? TUBAL LIGATION    ? ?Social History  ? ?Social History Narrative  ? Live with husband at home.   ? Left-handed.  ? 1-2 cups per day.  ? ?Immunization History  ?Administered Date(s) Administered  ? Influenza, High Dose Seasonal PF 04/29/2016, 05/03/2017  ? Influenza-Unspecified 05/21/2010, 04/28/2013, 05/17/2014, 04/30/2015, 05/03/2017, 05/10/2018  ? PFIZER(Purple Top)SARS-COV-2 Vaccination 09/02/2019, 09/23/2019, 05/06/2020  ? Pneumococcal Conjugate-13 03/29/2014  ? Pneumococcal Polysaccharide-23 05/21/2010, 09/05/2018  ? Zoster Recombinat (Shingrix) 12/10/2017, 04/08/2018  ? Zoster, Live 10/19/2007  ?  ? ?Objective: ?Vital Signs: BP 109/67 (BP Location: Left Arm, Patient Position: Sitting, Cuff Size: Normal)   Pulse 65   Ht 5' 1.5" (1.562 m)   Wt 117 lb 3.2 oz (53.2 kg)   BMI 21.79 kg/m?   ? ?Physical Exam ?Vitals and nursing note reviewed.  ?Constitutional:   ?   Appearance: She is well-developed.  ?HENT:  ?   Head: Normocephalic and atraumatic.  ?Eyes:  ?   Conjunctiva/sclera: Conjunctivae normal.  ?Cardiovascular:  ?   Rate and Rhythm: Normal rate and regular rhythm.  ?   Heart sounds: Normal heart sounds.  ?  Pulmonary:  ?   Effort: Pulmonary effort is normal.  ?   Breath sounds: Normal breath sounds.  ?Abdominal:  ?   General: Bowel sounds are normal.  ?   Palpations: Abdomen is soft.  ?Musculoskeletal:  ?   Cervical back: Normal range of motion.  ?Skin: ?   General: Skin is warm and dry.  ?   Capillary Refill: Capillary refill takes less than 2 seconds.  ?Neurological:  ?   Mental Status: She is alert and oriented to person, place, and time.  ?Psychiatric:     ?   Behavior: Behavior normal.  ?  ? ?Musculoskeletal Exam: C-spine has limited ROM.  Mild postural thoracic kyphosis noted.  Shoulder joints, elbow joints, wrist joints, MCPs, PIPs, and DIPs good ROM with no synovitis.  PIP and DIP thickening consistent with osteoarthritis of both hands noted.  Compete fist formation bilaterally.    Hip joints, knee joints, and ankle joints have good ROM with no discomfort.  No warmth or effusion of knee  joints.  No tenderness or swelling of ankle joints.  Mild pitting edema noted bilaterally. ? ?CDAI Exam: ?CDAI Score: -- ?Patient Global: --; Provider Global: -- ?Swollen: --; Tender: -- ?Joint Exam 12/03/2021  ?

## 2021-12-01 ENCOUNTER — Other Ambulatory Visit: Payer: Self-pay

## 2021-12-01 DIAGNOSIS — Z79899 Other long term (current) drug therapy: Secondary | ICD-10-CM

## 2021-12-01 LAB — COMPLETE METABOLIC PANEL WITH GFR
AG Ratio: 1.7 (calc) (ref 1.0–2.5)
ALT: 15 U/L (ref 6–29)
AST: 18 U/L (ref 10–35)
Albumin: 4.2 g/dL (ref 3.6–5.1)
Alkaline phosphatase (APISO): 82 U/L (ref 37–153)
BUN: 23 mg/dL (ref 7–25)
CO2: 31 mmol/L (ref 20–32)
Calcium: 9.5 mg/dL (ref 8.6–10.4)
Chloride: 101 mmol/L (ref 98–110)
Creat: 0.67 mg/dL (ref 0.60–1.00)
Globulin: 2.5 g/dL (calc) (ref 1.9–3.7)
Glucose, Bld: 90 mg/dL (ref 65–99)
Potassium: 4.9 mmol/L (ref 3.5–5.3)
Sodium: 137 mmol/L (ref 135–146)
Total Bilirubin: 0.3 mg/dL (ref 0.2–1.2)
Total Protein: 6.7 g/dL (ref 6.1–8.1)
eGFR: 91 mL/min/{1.73_m2} (ref >=60)

## 2021-12-01 LAB — CBC WITH DIFFERENTIAL/PLATELET
Absolute Monocytes: 530 cells/uL (ref 200–950)
Basophils Absolute: 10 cells/uL (ref 0–200)
Basophils Relative: 0.2 %
Eosinophils Absolute: 130 cells/uL (ref 15–500)
Eosinophils Relative: 2.5 %
HCT: 39.4 % (ref 35.0–45.0)
Hemoglobin: 13.2 g/dL (ref 11.7–15.5)
Lymphs Abs: 1560 cells/uL (ref 850–3900)
MCH: 32.4 pg (ref 27.0–33.0)
MCHC: 33.5 g/dL (ref 32.0–36.0)
MCV: 96.8 fL (ref 80.0–100.0)
MPV: 10.9 fL (ref 7.5–12.5)
Monocytes Relative: 10.2 %
Neutro Abs: 2969 cells/uL (ref 1500–7800)
Neutrophils Relative %: 57.1 %
Platelets: 227 10*3/uL (ref 140–400)
RBC: 4.07 10*6/uL (ref 3.80–5.10)
RDW: 11.6 % (ref 11.0–15.0)
Total Lymphocyte: 30 %
WBC: 5.2 10*3/uL (ref 3.8–10.8)

## 2021-12-03 ENCOUNTER — Encounter: Payer: Self-pay | Admitting: Physician Assistant

## 2021-12-03 ENCOUNTER — Ambulatory Visit: Payer: Medicare Other | Admitting: Physician Assistant

## 2021-12-03 VITALS — BP 109/67 | HR 65 | Ht 61.5 in | Wt 117.2 lb

## 2021-12-03 DIAGNOSIS — M3509 Sicca syndrome with other organ involvement: Secondary | ICD-10-CM

## 2021-12-03 DIAGNOSIS — Z79899 Other long term (current) drug therapy: Secondary | ICD-10-CM | POA: Diagnosis not present

## 2021-12-03 DIAGNOSIS — M810 Age-related osteoporosis without current pathological fracture: Secondary | ICD-10-CM

## 2021-12-03 DIAGNOSIS — Z8719 Personal history of other diseases of the digestive system: Secondary | ICD-10-CM

## 2021-12-03 DIAGNOSIS — Z8639 Personal history of other endocrine, nutritional and metabolic disease: Secondary | ICD-10-CM

## 2021-12-03 DIAGNOSIS — Z8739 Personal history of other diseases of the musculoskeletal system and connective tissue: Secondary | ICD-10-CM

## 2021-12-03 DIAGNOSIS — M19041 Primary osteoarthritis, right hand: Secondary | ICD-10-CM

## 2021-12-03 DIAGNOSIS — M0579 Rheumatoid arthritis with rheumatoid factor of multiple sites without organ or systems involvement: Secondary | ICD-10-CM

## 2021-12-03 DIAGNOSIS — M4802 Spinal stenosis, cervical region: Secondary | ICD-10-CM

## 2021-12-03 DIAGNOSIS — R002 Palpitations: Secondary | ICD-10-CM

## 2021-12-03 DIAGNOSIS — M19042 Primary osteoarthritis, left hand: Secondary | ICD-10-CM

## 2021-12-03 DIAGNOSIS — I7300 Raynaud's syndrome without gangrene: Secondary | ICD-10-CM

## 2021-12-03 DIAGNOSIS — M8589 Other specified disorders of bone density and structure, multiple sites: Secondary | ICD-10-CM

## 2022-03-11 ENCOUNTER — Other Ambulatory Visit: Payer: Self-pay | Admitting: Physician Assistant

## 2022-03-11 DIAGNOSIS — M0579 Rheumatoid arthritis with rheumatoid factor of multiple sites without organ or systems involvement: Secondary | ICD-10-CM

## 2022-03-11 NOTE — Telephone Encounter (Signed)
Next Visit: 05/05/2022  Last Visit: 12/03/2021  Labs: 12/01/2021 CBC and CMP WNL  Eye exam: 09/16/2021 WNL    Current Dose per office note 12/03/2021: Plaquenil 200 mg 1 tablet by mouth daily  DX: Rheumatoid arthritis involving multiple sites with positive rheumatoid factor   Last Fill: 10/24/2021  Okay to refill Plaquenil?

## 2022-03-19 ENCOUNTER — Other Ambulatory Visit: Payer: Self-pay | Admitting: Family Medicine

## 2022-03-19 DIAGNOSIS — Z1231 Encounter for screening mammogram for malignant neoplasm of breast: Secondary | ICD-10-CM

## 2022-04-09 ENCOUNTER — Ambulatory Visit
Admission: RE | Admit: 2022-04-09 | Discharge: 2022-04-09 | Disposition: A | Discharge status: Home / Self Care | Payer: Medicare Other | Source: Ambulatory Visit | Attending: Family Medicine | Admitting: Family Medicine

## 2022-04-09 DIAGNOSIS — Z1231 Encounter for screening mammogram for malignant neoplasm of breast: Secondary | ICD-10-CM

## 2022-04-22 NOTE — Progress Notes (Signed)
Office Visit Note  Patient: Linda Berg             Date of Birth: 01-19-1946           MRN: [id number]             PCP: Richmond Campbell., PA-C Referring: Richmond Campbell., PA-C Visit Date: 05/05/2022 Occupation: @GUAROCC @  Subjective:  Medication management  History of Present Illness: Linda Berg is a 76 y.o. female with history of seropositive rheumatoid arthritis, Sjogren's, osteoarthritis and osteoporosis.  She has been taking hydroxychloroquine 200 mg p.o. daily  She continues to have some joint stiffness in her hands but denies any swelling.  She continues to have some neck and stiffness.  She has dry mouth and dry eyes.  She has been using over-the-counter products but despite of that she still have dry mouth.  She is not taking any medications for osteoporosis.  She states she was evaluated by her gastroenterologist who recommended to avoid oral bisphosphonates because of the Barrett's esophagus.  Patient states that she will discuss this further with her dentist in October.  She developed COVID-19 virus infection in August.  She did not develop any complications from the COVID-19 virus infection but had side effects from the medication she took for the treatment.  Activities of Daily Living:  Patient reports morning stiffness for all day. Patient Reports nocturnal pain.  Difficulty dressing/grooming: Denies Difficulty climbing stairs: Denies Difficulty getting out of chair: Denies Difficulty using hands for taps, buttons, cutlery, and/or writing: Denies  Review of Systems  Constitutional:  Positive for fatigue.  HENT:  Positive for mouth dryness. Negative for mouth sores.   Eyes:  Positive for dryness.  Respiratory:  Negative for shortness of breath.   Cardiovascular:  Negative for chest pain and palpitations.  Gastrointestinal:  Negative for blood in stool, constipation and diarrhea.  Endocrine: Negative for increased urination.   Genitourinary:  Negative for involuntary urination.  Musculoskeletal:  Positive for joint pain, joint pain, joint swelling and morning stiffness. Negative for gait problem, myalgias, muscle weakness, muscle tenderness and myalgias.  Skin:  Negative for color change, rash, hair loss and sensitivity to sunlight.  Allergic/Immunologic: Negative for susceptible to infections.  Neurological:  Positive for headaches. Negative for dizziness.  Hematological:  Negative for swollen glands.  Psychiatric/Behavioral:  Negative for depressed mood and sleep disturbance. The patient is not nervous/anxious.     PMFS History:  Patient Active Problem List   Diagnosis Date Noted   Language difficulty 08/17/2018   Nonintractable headache 08/17/2018   Upper airway cough syndrome 10/13/2017   Pneumomediastinum (HCC)    Pneumoperitoneum    Acute respiratory failure (HCC)    Pneumothorax, traumatic    Anaphylaxis 08/17/2017   Trochanteric bursitis of both hips 03/16/2017   Rheumatoid arthritis involving multiple sites with positive rheumatoid factor (HCC) 10/09/2016   Sicca syndrome, unspecified 10/09/2016   Osteopenia of multiple sites 10/09/2016   History of scoliosis 10/09/2016   History of gastroesophageal reflux (GERD) 10/09/2016   History of hypothyroidism 10/09/2016   History of hyperlipidemia 10/09/2016   High risk medication use 10/09/2016   Primary osteoarthritis of both hands 10/09/2016   Paresthesia 10/11/2014   Hyperlipidemia 10/11/2014   Small vessel disease, 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/11/2014   Tinnitus 09/17/2014   Abdominal pain 09/05/2014   Acute cystitis 09/05/2014   Arthritis 09/05/2014   Cough 09/05/2014   Esophagitis 09/05/2014   Facial numbness 09/05/2014   Difficulty hearing 09/05/2014  H/O disease 09/05/2014   HLD (hyperlipidemia) 09/05/2014   Muscle ache 09/05/2014   Symptoms involving urinary system 09/05/2014   Awareness of heartbeats 09/05/2014   Acne erythematosa  09/05/2014   Absence of bladder continence 09/05/2014   Urgency of micturation 09/05/2014   Routine general medical examination at a health care facility 09/05/2014   DYSPHAGIA 01/09/2009   HYPOTHYROIDISM 11/04/2007   DYSLIPIDEMIA 11/04/2007   GERD 11/04/2007   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/04/2007   DUODENITIS, WITHOUT HEMORRHAGE 11/04/2007   HIATAL HERNIA 11/04/2007   DIVERTICULOSIS, COLON 11/04/2007   CONSTIPATION, CHRONIC 11/04/2007   RECTAL BLEEDING 11/04/2007    Past Medical History:  Diagnosis Date   Barrett esophagus    Diverticulosis    Dyslipidemia    Facial numbness    GERD (gastroesophageal reflux disease)    Hearing loss    Hiatal hernia    Hypothyroidism    Rheumatoid arteritis (HCC)     Family History  Problem Relation Age of Onset   Heart disease Mother    Rheumatic fever Mother    Heart failure Mother    Stroke Father    Diabetes Father    Heart failure Father    Heart disease Maternal Aunt        aunt x 2   Irritable bowel syndrome Son    GER disease Son    Colon cancer Neg Hx    Past Surgical History:  Procedure Laterality Date   CATARACT EXTRACTION, BILATERAL     04/13/2019, 04/27/2019   CHEST TUBE INSERTION  08/17/2017   COLONOSCOPY  08/17/2017   HAMMER TOE SURGERY Left    INTRAUTERINE DEVICE INSERTION     IUD REMOVAL     KNEE ARTHROPLASTY     KNEE ARTHROSCOPY  12/25/11   left   LEFT OOPHORECTOMY  03/13/98   MANDIBLE SURGERY  9/93   pallete expansion   TONSILLECTOMY     TUBAL LIGATION     Social History   Social History Narrative   Live with husband at home.   Left-handed.   1-2 cups per day.   Immunization History  Administered Date(s) Administered   Influenza, High Dose Seasonal PF 04/29/2016, 05/03/2017   Influenza-Unspecified 05/21/2010, 04/28/2013, 05/17/2014, 04/30/2015, 05/03/2017, 05/10/2018   PFIZER(Purple Top)SARS-COV-2 Vaccination 09/02/2019, 09/23/2019, 05/06/2020   Pneumococcal Conjugate-13 03/29/2014   Pneumococcal  Polysaccharide-23 05/21/2010, 09/05/2018   Zoster Recombinat (Shingrix) 12/10/2017, 04/08/2018   Zoster, Live 10/19/2007     Objective: Vital Signs: BP 110/68 (BP Location: Left Arm, Patient Position: Sitting, Cuff Size: Normal)   Pulse 75   Resp 14   Ht 5\' 1"  (1.549 m)   Wt 116 lb 9.6 oz (52.9 kg)   BMI 22.03 kg/m    Physical Exam Vitals and nursing note reviewed.  Constitutional:      Appearance: She is well-developed.  HENT:     Head: Normocephalic and atraumatic.  Eyes:     Conjunctiva/sclera: Conjunctivae normal.  Cardiovascular:     Rate and Rhythm: Normal rate and regular rhythm.     Heart sounds: Normal heart sounds.  Pulmonary:     Effort: Pulmonary effort is normal.     Breath sounds: Normal breath sounds.  Abdominal:     General: Bowel sounds are normal.     Palpations: Abdomen is soft.  Musculoskeletal:     Cervical back: Normal range of motion.  Lymphadenopathy:     Cervical: No cervical adenopathy.  Skin:    General: Skin is warm and dry.  Capillary Refill: Capillary refill takes less than 2 seconds.  Neurological:     Mental Status: She is alert and oriented to person, place, and time.  Psychiatric:        Behavior: Behavior normal.      Musculoskeletal Exam: She had limited lateral rotation of the cervical spine.  She had thoracic kyphosis and thoracolumbar scoliosis.  Shoulder joints, elbow joints, wrist joints, MCPs been good range of motion with no synovitis.  She had bilateral PIP and DIP thickening with no synovitis.  Hip joints and knee joints with good range of motion.  She had no tenderness over ankles or MTPs.  CDAI Exam: CDAI Score: 0.4  Patient Global: 3 mm; Provider Global: 1 mm Swollen: 0 ; Tender: 0  Joint Exam 05/05/2022   No joint exam has been documented for this visit   There is currently no information documented on the homunculus. Go to the Rheumatology activity and complete the homunculus joint exam.  Investigation: No  additional findings.  Imaging: MM 3D SCREEN BREAST BILATERAL  Result Date: 04/10/2022 CLINICAL DATA:  Screening. EXAM: DIGITAL SCREENING BILATERAL MAMMOGRAM WITH TOMOSYNTHESIS AND CAD TECHNIQUE: Bilateral screening digital craniocaudal and mediolateral oblique mammograms were obtained. Bilateral screening digital breast tomosynthesis was performed. The images were evaluated with computer-aided detection. COMPARISON:  Previous exam(s). ACR Breast Density Category b: There are scattered areas of fibroglandular density. FINDINGS: There are no findings suspicious for malignancy. IMPRESSION: No mammographic evidence of malignancy. A result letter of this screening mammogram will be mailed directly to the patient. RECOMMENDATION: Screening mammogram in one year. (Code:SM-B-01Y) BI-RADS CATEGORY  1: Negative. Electronically Signed   By: Frederico Hamman M.D.   On: 04/10/2022 15:11    Recent Labs: Lab Results  Component Value Date   WBC 3.8 05/01/2022   HGB 12.9 05/01/2022   PLT 223 05/01/2022   NA 134 (L) 05/01/2022   K 4.9 05/01/2022   CL 98 05/01/2022   CO2 29 05/01/2022   GLUCOSE 82 05/01/2022   BUN 16 05/01/2022   CREATININE 0.62 05/01/2022   BILITOT 0.3 05/01/2022   ALKPHOS 84 12/07/2019   AST 17 05/01/2022   ALT 18 05/01/2022   PROT 6.8 05/01/2022   ALBUMIN 4.4 12/07/2019   CALCIUM 9.1 05/01/2022   GFRAA 100 02/04/2021    Speciality Comments: PLQ eye exam: 09/16/2021 WNL @ [facility] Follow up in 1 year  Procedures:  No procedures performed Allergies: Ciprofloxacin, Terbinafine, and Lamisil at [powders]   Assessment / Plan:     Visit Diagnoses: Rheumatoid arthritis involving multiple sites with positive rheumatoid factor (HCC)-patient had no synovitis on my examination.  She continues to have some stiffness in her joints.  She is on hydroxychloroquine 200 mg daily which she has been tolerating well.  She continues to have some stiffness in her joints.  High risk  medication use - Plaquenil 200 mg 1 tablet by mouth daily. PLQ eye exam: 09/16/2021.  Labs obtained on May 01, 2022 CBC with differential and CMP with GFR were reviewed which were within normal limits.  Her eye exam was normal on September 16, 2021.  Immunization was discussed.  Sjogren's syndrome with other organ involvement (HCC) - Repeat ANA negative (03/27/20), Ro+, La-: She continues to have dry mouth and dry eye symptoms.  Over-the-counter products were discussed.  Raynaud's syndrome without gangrene-she has intermittent symptoms.  She had good capillary refill without any nailbed capillary changes.  No digital ulcers were noted.  Keeping core temperature  warm and warm clothing was discussed.  Primary osteoarthritis of both hands-she continues to have pain and stiffness in her hands due to underlying osteoarthritis.  No synovitis was noted.  Joint protection muscle strengthening was discussed.  Spinal stenosis of cervical region-she had mid to lateral rotation without any discomfort.  History of scoliosis-chronic pain.  Age-related osteoporosis without current pathological fracture - DEXA updated on 09/15/2021: Left radius BMD 0.537 with T score -2.6.  Patient states she had a discussion with her gastroenterologist and she cannot take oral bisphosphonates due to Barrett's esophagus.  I had a detailed discussion with patient regarding IV Reclast versus subcutaneous Prolia.  Indications side effects contraindications were discussed at length.  Patient is inclined more towards IV Reclast.  She would like to discuss the medications with her dentist.  She established with a new dentist and her appointment is in mid October.  She will let us know when she sees the dentist.  History of hyperlipidemia  History of gastroesophageal reflux (GERD)  History of diverticulitis  History of hypothyroidism  COVID-19 virus infection - 03/2022.  Patient states she did not develop any complications from the  COVID-19 infection.  Although she developed side effects from the medications she took.  She plans to get COVID-19 vaccine in November 2023.  Orders: No orders of the defined types were placed in this encounter.  No orders of the defined types were placed in this encounter.    Follow-Up Instructions: Return in about 5 months (around 10/05/2022) for Rheumatoid arthritis, Sjogren's, Osteoarthritis, Osteoporosis.   Pollyann Savoy, MD  Note - This record has been created using Animal nutritionist.  Chart creation errors have been sought, but may not always  have been located. Such creation errors do not reflect on  the standard of medical care.

## 2022-05-01 ENCOUNTER — Other Ambulatory Visit: Payer: Self-pay | Admitting: *Deleted

## 2022-05-01 DIAGNOSIS — M0579 Rheumatoid arthritis with rheumatoid factor of multiple sites without organ or systems involvement: Secondary | ICD-10-CM

## 2022-05-01 DIAGNOSIS — M3509 Sicca syndrome with other organ involvement: Secondary | ICD-10-CM

## 2022-05-01 DIAGNOSIS — Z79899 Other long term (current) drug therapy: Secondary | ICD-10-CM

## 2022-05-01 DIAGNOSIS — I7300 Raynaud's syndrome without gangrene: Secondary | ICD-10-CM

## 2022-05-02 LAB — COMPLETE METABOLIC PANEL WITH GFR
AG Ratio: 1.7 (calc) (ref 1.0–2.5)
ALT: 18 U/L (ref 6–29)
AST: 17 U/L (ref 10–35)
Albumin: 4.3 g/dL (ref 3.6–5.1)
Alkaline phosphatase (APISO): 77 U/L (ref 37–153)
BUN: 16 mg/dL (ref 7–25)
CO2: 29 mmol/L (ref 20–32)
Calcium: 9.1 mg/dL (ref 8.6–10.4)
Chloride: 98 mmol/L (ref 98–110)
Creat: 0.62 mg/dL (ref 0.60–1.00)
Globulin: 2.5 g/dL (calc) (ref 1.9–3.7)
Glucose, Bld: 82 mg/dL (ref 65–99)
Potassium: 4.9 mmol/L (ref 3.5–5.3)
Sodium: 134 mmol/L — ABNORMAL LOW (ref 135–146)
Total Bilirubin: 0.3 mg/dL (ref 0.2–1.2)
Total Protein: 6.8 g/dL (ref 6.1–8.1)
eGFR: 92 mL/min/{1.73_m2} (ref >=60)

## 2022-05-02 LAB — CBC WITH DIFFERENTIAL/PLATELET
Absolute Monocytes: 460 cells/uL (ref 200–950)
Basophils Absolute: 19 cells/uL (ref 0–200)
Basophils Relative: 0.5 %
Eosinophils Absolute: 99 cells/uL (ref 15–500)
Eosinophils Relative: 2.6 %
HCT: 38.5 % (ref 35.0–45.0)
Hemoglobin: 12.9 g/dL (ref 11.7–15.5)
Lymphs Abs: 1554 cells/uL (ref 850–3900)
MCH: 32.2 pg (ref 27.0–33.0)
MCHC: 33.5 g/dL (ref 32.0–36.0)
MCV: 96 fL (ref 80.0–100.0)
MPV: 10.5 fL (ref 7.5–12.5)
Monocytes Relative: 12.1 %
Neutro Abs: 1668 cells/uL (ref 1500–7800)
Neutrophils Relative %: 43.9 %
Platelets: 223 10*3/uL (ref 140–400)
RBC: 4.01 10*6/uL (ref 3.80–5.10)
RDW: 11.7 % (ref 11.0–15.0)
Total Lymphocyte: 40.9 %
WBC: 3.8 10*3/uL (ref 3.8–10.8)

## 2022-05-04 NOTE — Progress Notes (Signed)
CBC and CMP are normal except sodium is mildly decreased.  We will continue to monitor labs.  Please forward results to her PCP.

## 2022-05-05 ENCOUNTER — Ambulatory Visit: Payer: Medicare Other | Attending: Rheumatology | Admitting: Rheumatology

## 2022-05-05 ENCOUNTER — Encounter: Payer: Self-pay | Admitting: Rheumatology

## 2022-05-05 VITALS — BP 110/68 | HR 75 | Resp 14 | Ht 61.0 in | Wt 116.6 lb

## 2022-05-05 DIAGNOSIS — M3509 Sicca syndrome with other organ involvement: Secondary | ICD-10-CM

## 2022-05-05 DIAGNOSIS — M810 Age-related osteoporosis without current pathological fracture: Secondary | ICD-10-CM

## 2022-05-05 DIAGNOSIS — R002 Palpitations: Secondary | ICD-10-CM

## 2022-05-05 DIAGNOSIS — M0579 Rheumatoid arthritis with rheumatoid factor of multiple sites without organ or systems involvement: Secondary | ICD-10-CM | POA: Diagnosis not present

## 2022-05-05 DIAGNOSIS — U071 COVID-19: Secondary | ICD-10-CM

## 2022-05-05 DIAGNOSIS — M4802 Spinal stenosis, cervical region: Secondary | ICD-10-CM

## 2022-05-05 DIAGNOSIS — M19041 Primary osteoarthritis, right hand: Secondary | ICD-10-CM

## 2022-05-05 DIAGNOSIS — Z8739 Personal history of other diseases of the musculoskeletal system and connective tissue: Secondary | ICD-10-CM

## 2022-05-05 DIAGNOSIS — M19042 Primary osteoarthritis, left hand: Secondary | ICD-10-CM

## 2022-05-05 DIAGNOSIS — Z8719 Personal history of other diseases of the digestive system: Secondary | ICD-10-CM

## 2022-05-05 DIAGNOSIS — Z8639 Personal history of other endocrine, nutritional and metabolic disease: Secondary | ICD-10-CM

## 2022-05-05 DIAGNOSIS — Z79899 Other long term (current) drug therapy: Secondary | ICD-10-CM | POA: Diagnosis not present

## 2022-05-05 DIAGNOSIS — I7300 Raynaud's syndrome without gangrene: Secondary | ICD-10-CM

## 2022-05-05 NOTE — Patient Instructions (Addendum)
Zoledronic Acid Injection (Bone Disorders) What is this medication? ZOLEDRONIC ACID (ZOE le dron ik AS id) prevents and treats osteoporosis. It may also be used to treat Paget's disease of the bone. It works by making your bones stronger and less likely to break (fracture). It belongs to a group of medications called bisphosphonates. This medicine may be used for other purposes; ask your health care provider or pharmacist if you have questions. COMMON BRAND NAME(S): Reclast What should I tell my care team before I take this medication? They need to know if you have any of these conditions: Bleeding disorder Cancer Dental disease Kidney disease Low levels of calcium in the blood Low red blood cell counts Lung or breathing disease, such as asthma Receiving steroids, such as dexamethasone or prednisone An unusual or allergic reaction to zoledronic acid, other medications, foods, dyes, or preservatives Pregnant or trying to get pregnant Breast-feeding How should I use this medication? This medication is injected into a vein. It is given by your care team in a hospital or clinic setting. A special MedGuide will be given to you before each treatment. Be sure to read this information carefully each time. Talk to your care team about the use of this medication in children. Special care may be needed. Overdosage: If you think you have taken too much of this medicine contact a poison control center or emergency room at once. NOTE: This medicine is only for you. Do not share this medicine with others. What if I miss a dose? Keep appointments for follow-up doses. It is important not to miss your dose. Call your care team if you are unable to keep an appointment. What may interact with this medication? Certain antibiotics given by injection Medications for pain and inflammation, such as ibuprofen, naproxen, NSAIDs Some diuretics, such as bumetanide, furosemide Teriparatide This list may not  describe all possible interactions. Give your health care provider a list of all the medicines, herbs, non-prescription drugs, or dietary supplements you use. Also tell them if you smoke, drink alcohol, or use illegal drugs. Some items may interact with your medicine. What should I watch for while using this medication? Visit your care team for regular checks on your progress. It may be some time before you see the benefit from this medication. Some people who take this medication have severe bone, joint, or muscle pain. This medication may also increase your risk for jaw problems or a broken thigh bone. Tell your care team right away if you have severe pain in your jaw, bones, joints, or muscles. Tell your care team if you have any pain that does not go away or that gets worse. You should make sure you get enough calcium and vitamin D while you are taking this medication. Discuss the foods you eat and the vitamins you take with your care team. You may need bloodwork while taking this medication. Tell your dentist and dental surgeon that you are taking this medication. You should not have major dental surgery while on this medication. See your dentist to have a dental exam and fix any dental problems before starting this medication. Take good care of your teeth while on this medication. Make sure you see your dentist for regular follow-up appointments. What side effects may I notice from receiving this medication? Side effects that you should report to your care team as soon as possible: Allergic reactions--skin rash, itching, hives, swelling of the face, lips, tongue, or throat Kidney injury--decrease in the amount of urine,   swelling of the ankles, hands, or feet Low calcium level--muscle pain or cramps, confusion, tingling, or numbness in the hands or feet Osteonecrosis of the jaw--pain, swelling, or redness in the mouth, numbness of the jaw, poor healing after dental work, unusual discharge from the  mouth, visible bones in the mouth Severe bone, joint, or muscle pain Side effects that usually do not require medical attention (report to your care team if they continue or are bothersome): Diarrhea Dizziness Headache Nausea Stomach pain Vomiting This list may not describe all possible side effects. Call your doctor for medical advice about side effects. You may report side effects to FDA at 1-800-FDA-1088. Where should I keep my medication? This medication is given in a hospital or clinic. It will not be stored at home. NOTE: This sheet is a summary. It may not cover all possible information. If you have questions about this medicine, talk to your doctor, pharmacist, or health care provider.  2023 Elsevier/Gold Standard (2007-09-17 00:00:00)   Denosumab Injection (Osteoporosis) What is this medication? DENOSUMAB (den oh SUE mab) prevents and treats osteoporosis. It works by making your bones stronger and less likely to break (fracture). It is a monoclonal antibody. This medicine may be used for other purposes; ask your health care provider or pharmacist if you have questions. COMMON BRAND NAME(S): Prolia What should I tell my care team before I take this medication? They need to know if you have any of these conditions: Dental or gum disease, or plan to have dental surgery or a tooth pulled Infection Kidney disease Low levels of calcium or vitamin D in your blood On dialysis Poor nutrition Skin conditions Thyroid disease, or have had thyroid or parathyroid surgery Trouble absorbing minerals in your stomach or intestine An unusual reaction to denosumab, other medications, foods, dyes, or preservatives Pregnant or trying to get pregnant Breast-feeding How should I use this medication? This medication is injected under the skin. It is given by your care team in a hospital or clinic setting. A special MedGuide will be given to you before each treatment. Be sure to read this  information carefully each time. Talk to your care team about the use of this medication in children. Special care may be needed. Overdosage: If you think you have taken too much of this medicine contact a poison control center or emergency room at once. NOTE: This medicine is only for you. Do not share this medicine with others. What if I miss a dose? Keep appointments for follow-up doses. It is important not to miss your dose. Call your care team if you are unable to keep an appointment. What may interact with this medication? Do not take this medication with any of the following: Other medications that contain denosumab This medication may also interact with the following: Medications that lower your chance of fighting infection Steroid medications, such as prednisone or cortisone This list may not describe all possible interactions. Give your health care provider a list of all the medicines, herbs, non-prescription drugs, or dietary supplements you use. Also tell them if you smoke, drink alcohol, or use illegal drugs. Some items may interact with your medicine. What should I watch for while using this medication? Your condition will be monitored carefully while you are receiving this medication. You may need blood work while taking this medication. This medication may increase your risk of getting an infection. Call your care team for advice if you get a fever, chills, sore throat, or other symptoms of a   cold or flu. Do not treat yourself. Try to avoid being around people who are sick. Tell your dentist and dental surgeon that you are taking this medication. You should not have major dental surgery while on this medication. See your dentist to have a dental exam and fix any dental problems before starting this medication. Take good care of your teeth while on this medication. Make sure you see your dentist for regular follow-up appointments. You should make sure you get enough calcium and  vitamin D while you are taking this medication. Discuss the foods you eat and the vitamins you take with your care team. Talk to your care team if you are pregnant or think you might be pregnant. This medication can cause serious birth defects if taken during pregnancy and for 5 months after the last dose. You will need a negative pregnancy test before starting this medication. Contraception is recommended while taking this medication and for 5 months after the last dose. Your care team can help you find the option that works for you. Talk to your care team before breastfeeding. Changes to your treatment plan may be needed. What side effects may I notice from receiving this medication? Side effects that you should report to your care team as soon as possible: Allergic reactions--skin rash, itching, hives, swelling of the face, lips, tongue, or throat Infection--fever, chills, cough, sore throat, wounds that don't heal, pain or trouble when passing urine, general feeling of discomfort or being unwell Low calcium level--muscle pain or cramps, confusion, tingling, or numbness in the hands or feet Osteonecrosis of the jaw--pain, swelling, or redness in the mouth, numbness of the jaw, poor healing after dental work, unusual discharge from the mouth, visible bones in the mouth Severe bone, joint, or muscle pain Skin infection--skin redness, swelling, warmth, or pain Side effects that usually do not require medical attention (report these to your care team if they continue or are bothersome): Back pain Headache Joint pain Muscle pain Pain in the hands, arms, legs, or feet Runny or stuffy nose Sore throat This list may not describe all possible side effects. Call your doctor for medical advice about side effects. You may report side effects to FDA at 1-800-FDA-1088. Where should I keep my medication? This medication is given in a hospital or clinic. It will not be stored at home. NOTE: This sheet is a  summary. It may not cover all possible information. If you have questions about this medicine, talk to your doctor, pharmacist, or health care provider.  2023 Elsevier/Gold Standard (2021-12-08 00:00:00)  

## 2022-06-04 ENCOUNTER — Other Ambulatory Visit: Payer: Self-pay | Admitting: Physician Assistant

## 2022-06-04 DIAGNOSIS — M0579 Rheumatoid arthritis with rheumatoid factor of multiple sites without organ or systems involvement: Secondary | ICD-10-CM

## 2022-06-04 NOTE — Telephone Encounter (Signed)
Next Visit: 10/06/2022  Last Visit: 05/05/2022  Labs: 05/01/2022  CBC and CMP are normal except sodium is mildly decreased.  We will continue to monitor labs.  Please forward results to her PCP.  Eye exam: 09/16/2021   Current Dose per office note 05/05/2022: Plaquenil 200 mg 1 tablet by mouth daily  ZO:[id number] arthritis involving multiple sites with positive rheumatoid factor   Last Fill: 03/11/2022  Okay to refill Plaquenil?

## 2022-08-26 ENCOUNTER — Encounter: Payer: Self-pay | Admitting: Gastroenterology

## 2022-09-11 ENCOUNTER — Other Ambulatory Visit: Payer: Self-pay | Admitting: Physician Assistant

## 2022-09-11 ENCOUNTER — Encounter: Payer: Self-pay | Admitting: *Deleted

## 2022-09-11 DIAGNOSIS — M0579 Rheumatoid arthritis with rheumatoid factor of multiple sites without organ or systems involvement: Secondary | ICD-10-CM

## 2022-09-11 NOTE — Telephone Encounter (Signed)
Next Visit: 10/06/2022  Last Visit: 05/05/2022  Labs: 09/03/2022 CO2 32, WBC 3.2, RBC 3.95, MCV 96.4  Eye exam: 09/16/2021 WNL    Current Dose per office note 05/05/2022: Plaquenil 200 mg 1 tablet by mouth daily   VX:[id number] arthritis involving multiple sites with positive rheumatoid factor   Last Fill: 06/04/2022  Message sent to patient to remind her she is due to update her PLQ eye exam.  Okay to refill Plaquenil?

## 2022-09-16 ENCOUNTER — Ambulatory Visit: Payer: Medicare Other | Admitting: Cardiology

## 2022-09-16 ENCOUNTER — Encounter: Payer: Self-pay | Admitting: Cardiology

## 2022-09-16 VITALS — BP 117/71 | HR 82 | Resp 16 | Ht 65.0 in | Wt 118.2 lb

## 2022-09-16 DIAGNOSIS — I499 Cardiac arrhythmia, unspecified: Secondary | ICD-10-CM | POA: Insufficient documentation

## 2022-09-16 DIAGNOSIS — R072 Precordial pain: Secondary | ICD-10-CM

## 2022-09-16 NOTE — Progress Notes (Signed)
Patient referred by Aletha Halim., PA-C for palpitations  Subjective:   Linda Berg, female    DOB: 1946-01-09, 77 y.o.   MRN: [id number]   Chief Complaint  Patient presents with   Chest Pain   New Patient (Initial Visit)    HPI  77 y.o. Caucasian female with hypothyroidism, Rheumatoid arthritis, Sjogren's, ?h/o TIA, referred for palpitations  Patient has episodes of palpitations and irregular heart beat lasting for 30 minutes, occurring mostly at night.  There is no associated chest pain, shortness of breath symptoms. No aggravating or relieving factors noted.    Past Medical History:  Diagnosis Date   Barrett esophagus    Diverticulosis    Dyslipidemia    Facial numbness    GERD (gastroesophageal reflux disease)    Hearing loss    Hiatal hernia    Hypothyroidism    Rheumatoid arteritis (HCC)      Past Surgical History:  Procedure Laterality Date   CATARACT EXTRACTION, BILATERAL     04/13/2019, 04/27/2019   CHEST TUBE INSERTION  08/17/2017   COLONOSCOPY  08/17/2017   HAMMER TOE SURGERY Left    INTRAUTERINE DEVICE INSERTION     IUD REMOVAL     KNEE ARTHROPLASTY     KNEE ARTHROSCOPY  12/25/11   left   LEFT OOPHORECTOMY  03/13/98   MANDIBLE SURGERY  9/93   pallete expansion   TONSILLECTOMY     TUBAL LIGATION       Social History   Tobacco Use  Smoking Status Never   Passive exposure: Past  Smokeless Tobacco Never    Social History   Substance and Sexual Activity  Alcohol Use No   Alcohol/week: 0.0 standard drinks of alcohol     Family History  Problem Relation Age of Onset   Heart disease Mother    Rheumatic fever Mother    Heart failure Mother    Stroke Father    Diabetes Father    Heart failure Father    Heart disease Maternal Aunt        aunt x 2   Irritable bowel syndrome Son    GER disease Son    Colon cancer Neg Hx       Current Outpatient Medications:    albuterol (VENTOLIN HFA) 108 (90 Base) MCG/ACT  inhaler, Inhale into the lungs as needed., Disp: , Rfl:    AMBULATORY NON FORMULARY MEDICATION, ALLERGY SHOTS Once a week, Disp: , Rfl:    aspirin 81 MG tablet, Take 81 mg by mouth daily. , Disp: , Rfl:    Biotin 5000 MCG TABS, Take 1 tablet by mouth 1 day or 1 dose. , Disp: , Rfl:    Calcium Citrate-Vitamin D (CALCIUM CITRATE + D PO), Take 400 mg by mouth 2 (two) times daily., Disp: , Rfl:    cholecalciferol (VITAMIN D) 1000 units tablet, Take 1,000 Units by mouth daily., Disp: , Rfl:    Cyanocobalamin (VITAMIN B 12 PO), Take 1 tablet by mouth daily., Disp: , Rfl:    cycloSPORINE (RESTASIS) 0.05 % ophthalmic emulsion, Place 1 drop into both eyes 2 (two) times daily. , Disp: , Rfl:    EPINEPHrine 0.3 mg/0.3 mL IJ SOAJ injection, epinephrine 0.3 mg/0.3 mL injection, auto-injector, Disp: , Rfl:    esomeprazole (NEXIUM) 20 MG capsule, Take 20 mg by mouth 2 (two) times daily., Disp: , Rfl:    fexofenadine (ALLEGRA) 180 MG tablet, Take 180 mg by mouth daily., Disp: , Rfl:  fluocinonide (LIDEX) 0.05 % external solution, as needed., Disp: , Rfl:    fluticasone (FLONASE) 50 MCG/ACT nasal spray, Place into both nostrils daily., Disp: , Rfl:    hydroxychloroquine (PLAQUENIL) 200 MG tablet, TAKE 1 TABLET BY MOUTH DAILY, Disp: 90 tablet, Rfl: 0   Krill Oil 300 MG CAPS, Take 1 capsule by mouth daily., Disp: , Rfl:    lovastatin (MEVACOR) 20 MG tablet, Take 20 mg by mouth daily., Disp: , Rfl:    Magnesium 250 MG TABS, Take 1 tablet by mouth daily., Disp: , Rfl:    Peppermint Oil (IBGARD) 90 MG CPCR, Take by mouth. 2 capsules daily., Disp: , Rfl:    polyethylene glycol (MIRALAX / GLYCOLAX) 17 g packet, Take by mouth as needed. 1/2 capful daily, Disp: , Rfl:    Probiotic Product (PROBIOTIC DAILY PO), Take 1 tablet by mouth daily. , Disp: , Rfl:    thyroid (ARMOUR) 60 MG tablet, Take 60 mg by mouth daily before breakfast., Disp: , Rfl:    vitamin C (ASCORBIC ACID) 500 MG tablet, Take 1,000 mg by mouth 2 (two)  times daily. , Disp: , Rfl:    Zinc 50 MG CAPS, Take by mouth., Disp: , Rfl:    Cardiovascular and other pertinent studies:  Reviewed external labs and tests, independently interpreted  EKG 09/16/2022: Sinus rhythm 80 bpm  Low voltage in precordial leads Left atrial enlargement Anteroseptal infarct    Recent labs: 09/03/2022: Glucose 82, BUN/Cr 20/0.74. EGFR 84. Na/K 139/4.3. Rest of the CMP normal H/H 12/38. MCV 96. Platelets 255 HbA1C NA% Chol 152, TG 68, HDL 55, LDL 82 TSH 2.3 normal  Review of Systems  Cardiovascular:  Positive for palpitations. Negative for chest pain, dyspnea on exertion, leg swelling and syncope.         Vitals:   09/16/22 1318  BP: 117/71  Pulse: 82  Resp: 16  SpO2: 98%     Body mass index is 19.67 kg/m. Filed Weights   09/16/22 1318  Weight: 118 lb 3.2 oz (53.6 kg)     Objective:   Physical Exam Vitals and nursing note reviewed.  Constitutional:      General: She is not in acute distress. Neck:     Vascular: No JVD.  Cardiovascular:     Rate and Rhythm: Normal rate and regular rhythm.     Heart sounds: Normal heart sounds. No murmur heard. Pulmonary:     Effort: Pulmonary effort is normal.     Breath sounds: Normal breath sounds. No wheezing or rales.  Musculoskeletal:     Right lower leg: No edema.     Left lower leg: No edema.          Visit diagnoses:   ICD-10-CM   1. Precordial pain  R07.2 EKG 12-Lead    2. Irregular heart beat  I49.9 PCV ECHOCARDIOGRAM COMPLETE    LONG TERM MONITOR (3-14 DAYS)       Orders Placed This Encounter  Procedures   EKG 12-Lead     Medication changes this visit: Medications Discontinued During This Encounter  Medication Reason   cetirizine (ZYRTEC) 10 MG tablet    azelastine (ASTELIN) 137 MCG/SPRAY nasal spray     No orders of the defined types were placed in this encounter.    Assessment & Recommendations:   77 y.o. Caucasian female with hypothyroidism, Rheumatoid  arthritis, Sjogren's, ?h/o TIA, referred for palpitations  Afib is in the differential, so is SVT.Marland Kitchen Recommend echocardiogram, 2 week cardiac  telemetry. Discussed vagal maneuvers.  Further recommendations after above testing.   Thank you for referring the patient to [place]. Please feel free to contact with any questions.   Nigel Mormon, MD Pager: [phone] Office: [phone]

## 2022-09-22 NOTE — Progress Notes (Signed)
Office Visit Note  Patient: Linda Berg             Date of Birth: [birth year]           MRN: [id number]             PCP: Aletha Halim., PA-C Referring: Aletha Halim., PA-C Visit Date: 10/06/2022 Occupation: '@GUAROCC'$ @  Subjective:  Mediation monitoring   History of Present Illness: Linda Berg is a 77 y.o. female with history of seropositive rheumatoid arthritis, sjogren's syndrome, and osteoarthritis.  Patient is taking plaquenil 200 mg 1 tablet by mouth daily.  She has been tolerating Plaquenil without any side effects.  Patient reports that she continues to experience stiffness in both hands especially in the DIP joints.  She denies any joint swelling.  She states she is also been having intermittent discomfort in her right TMJ.  She states in the morning she wakes up feeling like she is clenching her jaw but has not noticed any grinding of her teeth at night.  She states that she continues to have intermittent discomfort in her neck and is unsure if it is related to her jaw pain.  She has also had some discomfort on the right side of her scalp which has been intermittent.  She denies any temporal artery tenderness during this time.  She denies any vision changes.  She has not had any increased fatigue or fevers.  She had a recent follow-up visit with ophthalmology on 09/15/2022 for routine follow up.    Patient reports she continues to experience palpitations intermittently.  She has establish care with cardiology and has undergone an echocardiogram which was unremarkable on 09/24/2022.  She states she is currently wearing a Holter monitor for 14 days. Patient remains hesitant to take any medications for osteoporosis since she has had to have several dental procedures. She is taking a calcium and vitamin D supplement daily.    Activities of Daily Living:  Patient reports morning stiffness for 24 hours.   Patient Reports nocturnal pain.   Difficulty dressing/grooming: Denies Difficulty climbing stairs: Denies Difficulty getting out of chair: Denies Difficulty using hands for taps, buttons, cutlery, and/or writing: Reports  Review of Systems  Constitutional:  Positive for fatigue.  HENT:  Positive for mouth sores and mouth dryness.   Eyes:  Positive for dryness.  Respiratory:  Negative for shortness of breath.   Cardiovascular:  Positive for palpitations. Negative for chest pain.  Gastrointestinal:  Positive for constipation. Negative for blood in stool and diarrhea.  Endocrine: Positive for increased urination.  Genitourinary:  Positive for involuntary urination.  Musculoskeletal:  Positive for joint pain, joint pain, joint swelling, myalgias, morning stiffness, muscle tenderness and myalgias. Negative for gait problem and muscle weakness.  Skin:  Positive for hair loss. Negative for color change, rash and sensitivity to sunlight.  Allergic/Immunologic: Negative for susceptible to infections.  Neurological:  Positive for headaches. Negative for dizziness.  Hematological:  Negative for swollen glands.  Psychiatric/Behavioral:  Positive for sleep disturbance. Negative for depressed mood. The patient is not nervous/anxious.     PMFS History:  Patient Active Problem List   Diagnosis Date Noted   Irregular heart beat 09/16/2022   Language difficulty 08/17/2018   Nonintractable headache 08/17/2018   Upper airway cough syndrome 10/13/2017   Pneumomediastinum (HCC)    Pneumoperitoneum    Acute respiratory failure (HCC)    Pneumothorax, traumatic    Anaphylaxis 08/17/2017   Trochanteric bursitis of  both hips 03/16/2017   Rheumatoid arthritis involving multiple sites with positive rheumatoid factor ([place]) 10/09/2016   Sicca syndrome, unspecified 10/09/2016   Osteopenia of multiple sites 10/09/2016   History of scoliosis 10/09/2016   History of gastroesophageal reflux (GERD) 10/09/2016   History of hypothyroidism  10/09/2016   History of hyperlipidemia 10/09/2016   High risk medication use 10/09/2016   Primary osteoarthritis of both hands 10/09/2016   Paresthesia 10/11/2014   Hyperlipidemia 10/11/2014   Small vessel disease, 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/11/2014   Tinnitus 09/17/2014   Abdominal pain 09/05/2014   Acute cystitis 09/05/2014   Arthritis 09/05/2014   Cough 09/05/2014   Esophagitis 09/05/2014   Facial numbness 09/05/2014   Difficulty hearing 09/05/2014   H/O disease 09/05/2014   HLD (hyperlipidemia) 09/05/2014   Muscle ache 09/05/2014   Symptoms involving urinary system 09/05/2014   Awareness of heartbeats 09/05/2014   Acne erythematosa 09/05/2014   Absence of bladder continence 09/05/2014   Urgency of micturation 09/05/2014   Routine general medical examination at a health care facility 09/05/2014   DYSPHAGIA 01/09/2009   HYPOTHYROIDISM 11/04/2007   DYSLIPIDEMIA 11/04/2007   GERD 11/04/2007   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/04/2007   DUODENITIS, WITHOUT HEMORRHAGE 11/04/2007   HIATAL HERNIA 11/04/2007   DIVERTICULOSIS, COLON 11/04/2007   CONSTIPATION, CHRONIC 11/04/2007   RECTAL BLEEDING 11/04/2007    Past Medical History:  Diagnosis Date   Barrett esophagus    Diverticulosis    Dyslipidemia    Facial numbness    GERD (gastroesophageal reflux disease)    Hearing loss    Hiatal hernia    Hypothyroidism    Rheumatoid arteritis ([place])     Family History  Problem Relation Age of Onset   Heart disease Mother    Rheumatic fever Mother    Heart failure Mother    Stroke Father    Diabetes Father    Heart failure Father    Heart disease Maternal Aunt        aunt x 2   Irritable bowel syndrome Son    GER disease Son    Colon cancer Neg Hx    Past Surgical History:  Procedure Laterality Date   CATARACT EXTRACTION, BILATERAL     04/13/2019, 04/27/2019   CHEST TUBE INSERTION  08/17/2017   COLONOSCOPY  08/17/2017   HAMMER TOE SURGERY Left    INTRAUTERINE DEVICE INSERTION     IUD  REMOVAL     KNEE ARTHROPLASTY     KNEE ARTHROSCOPY  12/25/11   left   LEFT OOPHORECTOMY  03/13/98   MANDIBLE SURGERY  9/93   pallete expansion   TONSILLECTOMY     TUBAL LIGATION     Social History   Social History Narrative   Live with husband at home.   Left-handed.   1-2 cups per day.   Immunization History  Administered Date(s) Administered   Influenza, High Dose Seasonal PF 04/29/2016, 05/03/2017   Influenza-Unspecified 05/21/2010, 04/28/2013, 05/17/2014, 04/30/2015, 05/03/2017, 05/10/2018   PFIZER(Purple Top)SARS-COV-2 Vaccination 09/02/2019, 09/23/2019, 05/06/2020   Pfizer Covid-19 Vaccine Bivalent Booster 30yr & up 05/23/2021   Pneumococcal Conjugate-13 03/29/2014   Pneumococcal Polysaccharide-23 05/21/2010, 09/05/2018   Zoster Recombinat (Shingrix) 12/10/2017, 04/08/2018   Zoster, Live 10/19/2007     Objective: Vital Signs: BP 100/64 (BP Location: Left Arm, Patient Position: Sitting, Cuff Size: Normal)   Pulse 76   Resp 16   Ht '5\' 1"'$  (1.549 m)   Wt 118 lb (53.5 kg)   BMI 22.30 kg/m    Physical  Exam Vitals and nursing note reviewed.  Constitutional:      Appearance: She is well-developed.  HENT:     Head: Normocephalic and atraumatic.  Eyes:     Conjunctiva/sclera: Conjunctivae normal.  Cardiovascular:     Rate and Rhythm: Normal rate and regular rhythm.     Heart sounds: Normal heart sounds.  Pulmonary:     Effort: Pulmonary effort is normal.     Breath sounds: Normal breath sounds.  Abdominal:     General: Bowel sounds are normal.     Palpations: Abdomen is soft.  Musculoskeletal:     Cervical back: Normal range of motion.  Skin:    General: Skin is warm and dry.     Capillary Refill: Capillary refill takes less than 2 seconds.  Neurological:     Mental Status: She is alert and oriented to person, place, and time.  Psychiatric:        Behavior: Behavior normal.      Musculoskeletal Exam: C-spine has limited range of motion with lateral  rotation.  Thoracic kyphosis noted.  Thoracolumbar scoliosis noted.  Tenderness of right TMJ.  No subluxation noted. No temporal artery tenderness.  Shoulder joints, elbow joints, wrist joints have good range of motion.  No tenderness or synovitis over MCP joints.  PIP and DIP thickening consistent with osteoarthritis of both hands.  Complete fist formation bilaterally.  Hip joints have good range of motion with no groin pain.  Tenderness over bilateral trochanteric bursa.  Knee joints have good range of motion with no warmth or effusion.  Ankle joints have good range of motion with no tenderness or joint swelling.  CDAI Exam: CDAI Score: -- Patient Global: 2 mm; Provider Global: 2 mm Swollen: --; Tender: -- Joint Exam 10/06/2022   No joint exam has been documented for this visit   There is currently no information documented on the homunculus. Go to the Rheumatology activity and complete the homunculus joint exam.  Investigation: No additional findings.  Imaging: PCV ECHOCARDIOGRAM COMPLETE  Result Date: 09/28/2022 Echocardiogram 09/24/2022: Normal LV systolic function with visual EF 60-65%. Left ventricle cavity is normal in size. Normal left ventricular wall thickness. Normal global wall motion. Indeterminate diastolic filling pattern. Calculated EF 68%. Structurally normal mitral valve.  Mild (Grade I) mitral regurgitation. Structurally normal tricuspid valve with trace regurgitation. No evidence of pulmonary hypertension. No prior available for comparison.    Recent Labs: Lab Results  Component Value Date   WBC 3.9 09/29/2022   HGB 12.7 09/29/2022   PLT 232 09/29/2022   NA 140 09/29/2022   K 5.5 (H) 09/29/2022   CL 104 09/29/2022   CO2 29 09/29/2022   GLUCOSE 96 09/29/2022   BUN 29 (H) 09/29/2022   CREATININE 0.73 09/29/2022   BILITOT 0.2 09/29/2022   ALKPHOS 84 12/07/2019   AST 21 09/29/2022   ALT 19 09/29/2022   PROT 6.7 09/29/2022   ALBUMIN 4.4 12/07/2019   CALCIUM 9.3  09/29/2022   GFRAA 100 02/04/2021    Speciality Comments: PLQ eye exam: 09/15/2022 WNL @ [facility] Follow up in 1 year  Procedures:  No procedures performed Allergies: Ciprofloxacin, Terbinafine, and Lamisil at [powders]   Assessment / Plan:     Visit Diagnoses: Rheumatoid arthritis involving multiple sites with positive rheumatoid factor ([place]) - She has no synovitis on examination today.  She has not had any signs or symptoms of a rheumatoid arthritis flare.  She has clinically been doing well taking Plaquenil 200  mg 1 tablet by mouth daily.  She continues to tolerate Plaquenil without any side effects and has not missed any doses recently.  She updated her Plaquenil eye examination on 09/15/2022.  She had updated lab work on 09/29/2022.  She continues to experience intermittent pain and stiffness in both hands due to underlying osteoarthritis.  She had PIP and DIP thickening along with [facility] joint prominence bilaterally on examination.  No active inflammation noted.  She will remain on Plaquenil as monotherapy.  She is advised to notify us if she develops any symptoms of a flare.  She will follow up in 5 months or sooner if needed.  plan: Sedimentation rate  High risk medication use - Plaquenil 200 mg 1 tablet by mouth daily.  CBC and CMP updated on 09/29/22.  Potassium was elevated at 5.5.  Patient was advised to have potassium rechecked with her PCP.  She has not been taking a potassium supplement.  She is currently wearing a Holter monitor ordered by cardiology.   PLQ eye exam: 09/15/2022 WNL @ [facility] Follow up in 1 year .   Sjogren's syndrome with other organ involvement ([place]) - Repeat ANA negative (03/27/20), Ro+, La-: She continues to have chronic sicca symptoms-unchanged. She continues to use restasis twice daily for dry eyes.  No parotid swelling or tenderness.  No cervical lymphadenopathy noted. She remains on plaquenil as prescribed.    Raynaud's syndrome without  gangrene: Currently active in fingertips.  No digital ulcers or signs of gangrene. She uses hand warmers and gloves as needed.  Good capillary refill <2 seconds today.      Primary osteoarthritis of both hands: PIP and DIP thickening consistent with osteoarthritis of both hands.  No inflammation noted.    Spinal stenosis of cervical region: C-spine has limited ROM with lateral rotation.   History of scoliosis: Thoracolumbar scoliosis.    Age-related osteoporosis without current pathological fracture - DEXA updated on 09/15/2021: Left radius BMD 0.537 with T score -2.6.  Patient states she had a discussion with her gastroenterologist and she cannot take oral bisphosphonates due to Barrett's esophagus.  Both Dr. Estanislado Pandy and I have discussed other treatment alternatives including IV reclast vs. Prolia but the patient has been hesitant to initiate   Chronic TMJ pain: Right-x1 month-intermittent.  Mild tenderness upon palpation today.  No subluxation noted.  Discussed the use of warm compresses and PRN NSAID use.  She was advised to notify us if her symptoms persist or worsen.   Other medical conditions are listed as follows:   History of hyperlipidemia  History of gastroesophageal reflux (GERD)  History of diverticulitis  History of hypothyroidism  COVID-19 virus infection  Orders: Orders Placed This Encounter  Procedures   Sedimentation rate   No orders of the defined types were placed in this encounter.    Follow-Up Instructions: Return in about 5 months (around 03/06/2023) for Rheumatoid arthritis, Sjogren's syndrome, Osteoarthritis.   Ofilia Neas, PA-C  Note - This record has been created using Dragon software.  Chart creation errors have been sought, but may not always  have been located. Such creation errors do not reflect on  the standard of medical care.

## 2022-09-24 ENCOUNTER — Other Ambulatory Visit: Payer: Medicare Other

## 2022-09-24 ENCOUNTER — Ambulatory Visit: Payer: Medicare Other

## 2022-09-24 DIAGNOSIS — I499 Cardiac arrhythmia, unspecified: Secondary | ICD-10-CM

## 2022-09-29 ENCOUNTER — Other Ambulatory Visit: Payer: Self-pay | Admitting: *Deleted

## 2022-09-29 DIAGNOSIS — Z79899 Other long term (current) drug therapy: Secondary | ICD-10-CM

## 2022-09-30 LAB — COMPLETE METABOLIC PANEL WITH GFR
AG Ratio: 1.8 (calc) (ref 1.0–2.5)
ALT: 19 U/L (ref 6–29)
AST: 21 U/L (ref 10–35)
Albumin: 4.3 g/dL (ref 3.6–5.1)
Alkaline phosphatase (APISO): 77 U/L (ref 37–153)
BUN/Creatinine Ratio: 40 (calc) — ABNORMAL HIGH (ref 6–22)
BUN: 29 mg/dL — ABNORMAL HIGH (ref 7–25)
CO2: 29 mmol/L (ref 20–32)
Calcium: 9.3 mg/dL (ref 8.6–10.4)
Chloride: 104 mmol/L (ref 98–110)
Creat: 0.73 mg/dL (ref 0.60–1.00)
Globulin: 2.4 g/dL (calc) (ref 1.9–3.7)
Glucose, Bld: 96 mg/dL (ref 65–99)
Potassium: 5.5 mmol/L — ABNORMAL HIGH (ref 3.5–5.3)
Sodium: 140 mmol/L (ref 135–146)
Total Bilirubin: 0.2 mg/dL (ref 0.2–1.2)
Total Protein: 6.7 g/dL (ref 6.1–8.1)
eGFR: 85 mL/min/{1.73_m2} (ref >=60)

## 2022-09-30 LAB — CBC WITH DIFFERENTIAL/PLATELET
Absolute Monocytes: 445 cells/uL (ref 200–950)
Basophils Absolute: 20 cells/uL (ref 0–200)
Basophils Relative: 0.5 %
Eosinophils Absolute: 101 cells/uL (ref 15–500)
Eosinophils Relative: 2.6 %
HCT: 37.7 % (ref 35.0–45.0)
Hemoglobin: 12.7 g/dL (ref 11.7–15.5)
Lymphs Abs: 1388 cells/uL (ref 850–3900)
MCH: 32.5 pg (ref 27.0–33.0)
MCHC: 33.7 g/dL (ref 32.0–36.0)
MCV: 96.4 fL (ref 80.0–100.0)
MPV: 10.6 fL (ref 7.5–12.5)
Monocytes Relative: 11.4 %
Neutro Abs: 1946 cells/uL (ref 1500–7800)
Neutrophils Relative %: 49.9 %
Platelets: 232 10*3/uL (ref 140–400)
RBC: 3.91 10*6/uL (ref 3.80–5.10)
RDW: 11.5 % (ref 11.0–15.0)
Total Lymphocyte: 35.6 %
WBC: 3.9 10*3/uL (ref 3.8–10.8)

## 2022-09-30 NOTE — Progress Notes (Signed)
CBC is normal.  Potassium is elevated, probably a hemolyzed sample.  Patient is not on potassium supplement.  Please forward results to her PCP.

## 2022-10-06 ENCOUNTER — Encounter: Payer: Self-pay | Admitting: Physician Assistant

## 2022-10-06 ENCOUNTER — Telehealth: Payer: Self-pay | Admitting: *Deleted

## 2022-10-06 ENCOUNTER — Ambulatory Visit: Payer: Medicare Other | Attending: Physician Assistant | Admitting: Physician Assistant

## 2022-10-06 VITALS — BP 100/64 | HR 76 | Resp 16 | Ht 61.0 in | Wt 118.0 lb

## 2022-10-06 DIAGNOSIS — M3509 Sicca syndrome with other organ involvement: Secondary | ICD-10-CM | POA: Diagnosis not present

## 2022-10-06 DIAGNOSIS — Z8719 Personal history of other diseases of the digestive system: Secondary | ICD-10-CM

## 2022-10-06 DIAGNOSIS — M19042 Primary osteoarthritis, left hand: Secondary | ICD-10-CM

## 2022-10-06 DIAGNOSIS — Z8739 Personal history of other diseases of the musculoskeletal system and connective tissue: Secondary | ICD-10-CM

## 2022-10-06 DIAGNOSIS — M26629 Arthralgia of temporomandibular joint, unspecified side: Secondary | ICD-10-CM

## 2022-10-06 DIAGNOSIS — M19041 Primary osteoarthritis, right hand: Secondary | ICD-10-CM

## 2022-10-06 DIAGNOSIS — M0579 Rheumatoid arthritis with rheumatoid factor of multiple sites without organ or systems involvement: Secondary | ICD-10-CM

## 2022-10-06 DIAGNOSIS — G8929 Other chronic pain: Secondary | ICD-10-CM

## 2022-10-06 DIAGNOSIS — Z8639 Personal history of other endocrine, nutritional and metabolic disease: Secondary | ICD-10-CM

## 2022-10-06 DIAGNOSIS — Z79899 Other long term (current) drug therapy: Secondary | ICD-10-CM | POA: Diagnosis not present

## 2022-10-06 DIAGNOSIS — I7300 Raynaud's syndrome without gangrene: Secondary | ICD-10-CM | POA: Diagnosis not present

## 2022-10-06 DIAGNOSIS — U071 COVID-19: Secondary | ICD-10-CM

## 2022-10-06 DIAGNOSIS — M810 Age-related osteoporosis without current pathological fracture: Secondary | ICD-10-CM

## 2022-10-06 DIAGNOSIS — M4802 Spinal stenosis, cervical region: Secondary | ICD-10-CM

## 2022-10-06 NOTE — Patient Instructions (Signed)
Temporomandibular Joint Syndrome  Temporomandibular joint syndrome (TMJ syndrome) is a condition that causes pain in the temporomandibular joints. These joints are located near your ears and allow your jaw to open and close. For people with TMJ syndrome, chewing, biting, or other movements of the jaw can be difficult or painful. TMJ syndrome is often mild and goes away within a few weeks. However, sometimes the condition becomes a long-term (chronic) problem. What are the causes? This condition may be caused by: Grinding your teeth or clenching your jaw. Some people do this when they are stressed. Arthritis. An injury to the jaw. A head or neck injury. Teeth or dentures that are not aligned well. In some cases, the cause of TMJ syndrome may not be known. What are the signs or symptoms? The most common symptom of this condition is aching pain on the side of the head in the area of the TMJ. Other symptoms may include: Pain when moving your jaw, such as when chewing or biting. Not being able to open your jaw all the way. Making a clicking sound when you open your mouth. Headache. Earache. Neck or shoulder pain. How is this diagnosed? This condition may be diagnosed based on: Your symptoms and medical history. A physical exam. Your health care provider may check the range of motion of your jaw. Imaging tests, such as X-rays or an MRI. You may also need to see your dentist, who will check if your teeth and jaw are lined up correctly. How is this treated? TMJ syndrome often goes away on its own. If treatment is needed, it may include: Eating soft foods and applying ice or heat. Medicines to relieve pain or inflammation. Medicines or massage to relax the muscles. A splint, bite plate, or mouthpiece to prevent teeth grinding or jaw clenching. Relaxation techniques or counseling to help reduce stress. A therapy for pain in which an electrical current is applied to the nerves through the skin  (transcutaneous electrical nerve stimulation). Acupuncture. This may help to relieve pain. Jaw surgery. This is rarely needed. Follow these instructions at home:  Eating and drinking Eat a soft diet if you are having trouble chewing. Avoid foods that require a lot of chewing. Do not chew gum. General instructions Take over-the-counter and prescription medicines only as told by your health care provider. If directed, put ice on the painful area. To do this: Put ice in a plastic bag. Place a towel between your skin and the bag. Leave the ice on for 20 minutes, 2-3 times a day. Remove the ice if your skin turns bright red. This is very important. If you cannot feel pain, heat, or cold, you have a greater risk of damage to the area. Apply a warm, wet cloth (warm compress) to the painful area as told. Massage your jaw area and do any jaw stretching exercises as told by your health care provider. If you were given a splint, bite plate, or mouthpiece, wear it as told by your health care provider. Keep all follow-up visits. This is important. Where to find more information [place]: [email or web address]/ Contact a health care provider if: You have trouble eating. You have new or worsening symptoms. Get help right away if: Your jaw locks. Summary Temporomandibular joint syndrome (TMJ syndrome) is a condition that causes pain in the temporomandibular joints. These joints are located near your ears and allow your jaw to open and close. TMJ syndrome is often mild and goes away within  a few weeks. However, sometimes the condition becomes a long-term (chronic) problem. Symptoms include an aching pain on the side of the head in the area of the TMJ, pain when chewing or biting, and being unable to open your jaw all the way. You may also make a clicking sound when you open your mouth. TMJ syndrome often goes away on its own. If treatment is needed, it may  include medicines to relieve pain, reduce inflammation, or relax the muscles. A splint, bite plate, or mouthpiece may also be used to prevent teeth grinding or jaw clenching. This information is not intended to replace advice given to you by your health care provider. Make sure you discuss any questions you have with your health care provider. Document Revised: 03/09/2021 Document Reviewed: 03/09/2021 Elsevier Patient Education  [employer].

## 2022-10-06 NOTE — Telephone Encounter (Signed)
Patient contacted the office and left message stating that she had an appointment in our office this afternoon. She states you mentioned some labs you wanted her to have re checked with her PCP. She states she called to schedule an appointment to have those drawn but the orders were not sent over there. Please advise.

## 2022-10-06 NOTE — Telephone Encounter (Signed)
Potassium was elevated with last lab work. Results were forwarded after Dr. Estanislado Pandy reviewed results.  According to the patient her PCP did not reach out to recheck potassium. Recommend rechecking potassium.

## 2022-10-06 NOTE — Telephone Encounter (Signed)
Patient advised Potassium was elevated with last lab work. Results were forwarded after Dr. Estanislado Pandy reviewed results.  According to the patient her PCP did not reach out to recheck potassium. Recommend rechecking potassium. Patient advised we were not sending an order. Patient states her PCP did not received the lab results. Patient advised I would re-fax the results. Patient also advised that Dr. Deatra Ina should be able to view her results in the Epic system. Patient expressed understanding.

## 2022-10-11 ENCOUNTER — Other Ambulatory Visit: Payer: Self-pay | Admitting: Physician Assistant

## 2022-10-11 DIAGNOSIS — M0579 Rheumatoid arthritis with rheumatoid factor of multiple sites without organ or systems involvement: Secondary | ICD-10-CM

## 2022-10-26 NOTE — Progress Notes (Signed)
Patient referred by Aletha Halim., PA-C for palpitations  Subjective:   Linda Berg, female    DOB: 1945/09/05, 77 y.o.   MRN: [id number]   Chief Complaint  Patient presents with   Irregular Heart Beat   Follow-up    HPI  77 y.o. Caucasian female with hypothyroidism, with hypothyroidism, Rheumatoid arthritis, Sjogren's, ?h/o TIA, referred for palpitations  Reviewed recent test results with the patient, details below. Patient has had only occasional palpitations symptoms since last visit.   Initial consultation visit 09/2022: Patient has episodes of palpitations and irregular heart beat lasting for 30 minutes, occurring mostly at night.  There is no associated chest pain, shortness of breath symptoms. No aggravating or relieving factors noted.     Current Outpatient Medications:    albuterol (VENTOLIN HFA) 108 (90 Base) MCG/ACT inhaler, Inhale into the lungs as needed., Disp: , Rfl:    AMBULATORY NON FORMULARY MEDICATION, ALLERGY SHOTS Once a week, Disp: , Rfl:    aspirin 81 MG tablet, Take 81 mg by mouth daily. , Disp: , Rfl:    Biotin 5000 MCG TABS, Take 1 tablet by mouth 1 day or 1 dose. , Disp: , Rfl:    Calcium Citrate-Vitamin D (CALCIUM CITRATE + D PO), Take 400 mg by mouth 2 (two) times daily., Disp: , Rfl:    cholecalciferol (VITAMIN D) 1000 units tablet, Take 1,000 Units by mouth daily., Disp: , Rfl:    Cyanocobalamin (VITAMIN B 12 PO), Take 1 tablet by mouth daily., Disp: , Rfl:    cyclobenzaprine (FLEXERIL) 5 MG tablet, Take by mouth., Disp: , Rfl:    cycloSPORINE (RESTASIS) 0.05 % ophthalmic emulsion, Place 1 drop into both eyes 2 (two) times daily. , Disp: , Rfl:    EPINEPHrine 0.3 mg/0.3 mL IJ SOAJ injection, epinephrine 0.3 mg/0.3 mL injection, auto-injector, Disp: , Rfl:    esomeprazole (NEXIUM) 20 MG capsule, Take 20 mg by mouth 2 (two) times daily., Disp: , Rfl:    fexofenadine (ALLEGRA) 180 MG tablet, Take 180 mg by mouth daily., Disp: , Rfl:     fluocinonide (LIDEX) 0.05 % external solution, as needed., Disp: , Rfl:    fluticasone (FLONASE) 50 MCG/ACT nasal spray, Place into both nostrils daily., Disp: , Rfl:    hydroxychloroquine (PLAQUENIL) 200 MG tablet, TAKE 1 TABLET BY MOUTH DAILY, Disp: 90 tablet, Rfl: 0   Krill Oil 300 MG CAPS, Take 1 capsule by mouth daily., Disp: , Rfl:    lovastatin (MEVACOR) 20 MG tablet, Take 20 mg by mouth daily., Disp: , Rfl:    Magnesium 250 MG TABS, Take 1 tablet by mouth daily., Disp: , Rfl:    Peppermint Oil (IBGARD) 90 MG CPCR, Take by mouth. 2 capsules daily., Disp: , Rfl:    polyethylene glycol (MIRALAX / GLYCOLAX) 17 g packet, Take by mouth as needed. 1/2 capful daily, Disp: , Rfl:    Probiotic Product (PROBIOTIC DAILY PO), Take 1 tablet by mouth daily. , Disp: , Rfl:    thyroid (ARMOUR) 60 MG tablet, Take 60 mg by mouth daily before breakfast., Disp: , Rfl:    vitamin C (ASCORBIC ACID) 500 MG tablet, Take 1,000 mg by mouth 2 (two) times daily. , Disp: , Rfl:    Zinc 30 MG CAPS, Take by mouth., Disp: , Rfl:    Zinc 50 MG CAPS, Take by mouth. (Patient not taking: Reported on 10/06/2022), Disp: , Rfl:    Cardiovascular and other pertinent studies:  Reviewed external  labs and tests, independently interpreted  EKG 09/16/2022: Sinus rhythm 80 bpm  Low voltage in precordial leads Left atrial enlargement Anteroseptal infarct   Mobile cardiac telemetry 13 days 09/24/2022 - 10/08/2022: Dominant rhythm: Sinus. HR 53-116 bpm. Avg HR 75 bpm, in sinus rhythm. 47 episodes of probable atrial tachycardia/SVT, fastest at 169 bpm for 8 beats, longest for 11 beats at 109 bpm. 1.5% isolated SVE, <1% couplet/triplets. 0 episodes of VT. <1% isolated VE, no couplet/triplets. No atrial fibrillation/atrial flutter/VT/high grade AV block, sinus pause >3sec noted. 7 patient triggered events, correlated with SVE.  Echocardiogram 09/24/2022:  Normal LV systolic function with visual EF 60-65%. Left ventricle cavity   is normal in size. Normal left ventricular wall thickness. Normal global  wall motion. Indeterminate diastolic filling pattern. Calculated EF 68%.  Structurally normal mitral valve.  Mild (Grade I) mitral regurgitation.  Structurally normal tricuspid valve with trace regurgitation. No evidence  of pulmonary hypertension.  No prior available for comparison.    Recent labs: 09/29/2022: Glucose 96, BUN/Cr 29/0.73. EGFR 85. Na/K 140/5.5. Rest of the CMP normal H/H 12/37. MCV 96. Platelets 232  09/03/2022: Glucose 82, BUN/Cr 20/0.74. EGFR 84. Na/K 139/4.3. Rest of the CMP normal H/H 12/38. MCV 96. Platelets 255 HbA1C NA% Chol 152, TG 68, HDL 55, LDL 82 TSH 2.3 normal  Review of Systems  Cardiovascular:  Positive for palpitations. Negative for chest pain, dyspnea on exertion, leg swelling and syncope.         Vitals:   10/30/22 1439  BP: 111/67  Pulse: 77  Resp: 16  SpO2: 98%     Body mass index is 22.11 kg/m. Filed Weights   10/30/22 1439  Weight: 117 lb (53.1 kg)     Objective:   Physical Exam Vitals and nursing note reviewed.  Constitutional:      General: She is not in acute distress. Neck:     Vascular: No JVD.  Cardiovascular:     Rate and Rhythm: Normal rate and regular rhythm.     Heart sounds: Normal heart sounds. No murmur heard. Pulmonary:     Effort: Pulmonary effort is normal.     Breath sounds: Normal breath sounds. No wheezing or rales.  Musculoskeletal:     Right lower leg: No edema.     Left lower leg: No edema.          Visit diagnoses:   ICD-10-CM   1. Irregular heart beat  I49.9     2. Atrial tachycardia  I47.19 diltiazem (CARDIZEM) 30 MG tablet       Meds ordered this encounter  Medications   diltiazem (CARDIZEM) 30 MG tablet    Sig: Take 1 tablet (30 mg total) by mouth every 8 (eight) hours as needed.    Dispense:  60 tablet    Refill:  3     Assessment & Recommendations:   77 y.o. Caucasian female with  hypothyroidism, with  hypothyroidism, Rheumatoid arthritis, Sjogren's, ?h/o TIA, referred for palpitations  Palpitations: Structurally normal heart Short lasting atrial tachycardia/SVT noted on monitor. No Afib seen. Discussed vagal maneuvers. Prescribed prn diltiazem 30 mg.  F//u in 6 months    Nigel Mormon, MD Pager: [phone] Office: [phone]

## 2022-10-30 ENCOUNTER — Ambulatory Visit: Payer: Medicare Other | Admitting: Cardiology

## 2022-10-30 ENCOUNTER — Encounter: Payer: Self-pay | Admitting: Cardiology

## 2022-10-30 VITALS — BP 111/67 | HR 77 | Resp 16 | Ht 61.0 in | Wt 117.0 lb

## 2022-10-30 DIAGNOSIS — I4719 Other supraventricular tachycardia: Secondary | ICD-10-CM

## 2022-10-30 DIAGNOSIS — I499 Cardiac arrhythmia, unspecified: Secondary | ICD-10-CM

## 2022-10-30 MED ORDER — DILTIAZEM HCL 30 MG PO TABS: 30.0000 mg | ORAL_TABLET | Freq: Three times a day (TID) | ORAL | 3 refills | Status: DC | PRN

## 2022-12-12 ENCOUNTER — Other Ambulatory Visit: Payer: Self-pay | Admitting: Physician Assistant

## 2022-12-12 DIAGNOSIS — M0579 Rheumatoid arthritis with rheumatoid factor of multiple sites without organ or systems involvement: Secondary | ICD-10-CM

## 2022-12-14 NOTE — Telephone Encounter (Signed)
Last Fill: 09/11/2022  Eye exam: 09/15/2022 WNL    Labs: 09/29/2022 BUN/Creat. Ratio 40, Potassium 5.5  Next Visit: 03/10/2023  Last Visit: 10/06/2022  DX: Rheumatoid arthritis involving multiple sites with positive rheumatoid factor   Current Dose per office note 10/06/2022:  Plaquenil 200 mg 1 tablet by mouth daily.   Okay to refill Plaquenil?

## 2023-02-24 NOTE — Progress Notes (Signed)
Office Visit Note  Patient: Linda Berg             Date of Birth: 1946/06/08           MRN: [id number]             PCP: Richmond Campbell., PA-C Referring: Richmond Campbell., PA-C Visit Date: 03/10/2023 Occupation: @GUAROCC @  Subjective:  Medication management  History of Present Illness: Linda Berg is a 77 y.o. female with seropositive rheumatoid arthritis, Sjogren's, osteoarthritis and osteoporosis.  She returns today after her last visit in February 2024.  She states she has been taking hydroxychloroquine without any interruption.  She denies any increased joint swelling.  She continues to have pain and stiffness in her hands.  She complains of a stiffness in her cervical spine and her bilateral TMJs.  She states the TMJ pain started after the crown procedure.  She continues to have Raynauds symptoms.  The symptoms are worse during the winter months.    Activities of Daily Living:  Patient reports morning stiffness for all day.  Patient Reports nocturnal pain.  Difficulty dressing/grooming: Denies Difficulty climbing stairs: Denies Difficulty getting out of chair: Denies Difficulty using hands for taps, buttons, cutlery, and/or writing: Denies  Review of Systems  Constitutional:  Positive for fatigue.  HENT:  Positive for mouth dryness. Negative for mouth sores.   Eyes:  Positive for dryness.  Respiratory:  Negative for cough, shortness of breath and wheezing.   Cardiovascular:  Positive for palpitations. Negative for chest pain.  Gastrointestinal:  Positive for constipation. Negative for blood in stool and diarrhea.  Endocrine: Negative for increased urination.  Genitourinary:  Negative for involuntary urination.  Musculoskeletal:  Positive for morning stiffness. Negative for joint pain, gait problem, joint pain, joint swelling, myalgias, muscle weakness, muscle tenderness and myalgias.  Skin:  Positive for color change and hair loss.  Negative for rash and sensitivity to sunlight.  Allergic/Immunologic: Negative for susceptible to infections.  Neurological:  Positive for numbness. Negative for dizziness and headaches.  Hematological:  Negative for swollen glands.  Psychiatric/Behavioral:  Negative for depressed mood and sleep disturbance. The patient is not nervous/anxious.     PMFS History:  Patient Active Problem List   Diagnosis Date Noted   Irregular heart beat 09/16/2022   Language difficulty 08/17/2018   Nonintractable headache 08/17/2018   Upper airway cough syndrome 10/13/2017   Pneumomediastinum (HCC)    Pneumoperitoneum    Acute respiratory failure (HCC)    Pneumothorax, traumatic    Anaphylaxis 08/17/2017   Trochanteric bursitis of both hips 03/16/2017   Rheumatoid arthritis involving multiple sites with positive rheumatoid factor (HCC) 10/09/2016   Sicca syndrome, unspecified 10/09/2016   Osteopenia of multiple sites 10/09/2016   History of scoliosis 10/09/2016   History of gastroesophageal reflux (GERD) 10/09/2016   History of hypothyroidism 10/09/2016   History of hyperlipidemia 10/09/2016   High risk medication use 10/09/2016   Primary osteoarthritis of both hands 10/09/2016   Paresthesia 10/11/2014   Hyperlipidemia 10/11/2014   Small vessel disease, 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/11/2014   Tinnitus 09/17/2014   Abdominal pain 09/05/2014   Acute cystitis 09/05/2014   Arthritis 09/05/2014   Cough 09/05/2014   Esophagitis 09/05/2014   Facial numbness 09/05/2014   Difficulty hearing 09/05/2014   H/O disease 09/05/2014   HLD (hyperlipidemia) 09/05/2014   Muscle ache 09/05/2014   Symptoms involving urinary system 09/05/2014   Awareness of heartbeats 09/05/2014   Acne erythematosa 09/05/2014  Absence of bladder continence 09/05/2014   Urgency of micturation 09/05/2014   Routine general medical examination at a health care facility 09/05/2014   DYSPHAGIA 01/09/2009   HYPOTHYROIDISM 11/04/2007    DYSLIPIDEMIA 11/04/2007   GERD 11/04/2007   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/04/2007   DUODENITIS, WITHOUT HEMORRHAGE 11/04/2007   HIATAL HERNIA 11/04/2007   DIVERTICULOSIS, COLON 11/04/2007   CONSTIPATION, CHRONIC 11/04/2007   RECTAL BLEEDING 11/04/2007    Past Medical History:  Diagnosis Date   Barrett esophagus    Diverticulosis    Dyslipidemia    Facial numbness    GERD (gastroesophageal reflux disease)    Hearing loss    Hiatal hernia    Hypothyroidism    Rheumatoid arteritis (HCC)     Family History  Problem Relation Age of Onset   Heart disease Mother    Rheumatic fever Mother    Heart failure Mother    Stroke Father    Diabetes Father    Heart failure Father    Heart disease Maternal Aunt        aunt x 2   Irritable bowel syndrome Son    GER disease Son    Colon cancer Neg Hx    Past Surgical History:  Procedure Laterality Date   CATARACT EXTRACTION, BILATERAL     04/13/2019, 04/27/2019   CHEST TUBE INSERTION  08/17/2017   COLONOSCOPY  08/17/2017   HAMMER TOE SURGERY Left    INTRAUTERINE DEVICE INSERTION     IUD REMOVAL     KNEE ARTHROPLASTY     KNEE ARTHROSCOPY  12/25/11   left   LEFT OOPHORECTOMY  03/13/98   MANDIBLE SURGERY  9/93   pallete expansion   TONSILLECTOMY     TUBAL LIGATION     Social History   Social History Narrative   Live with husband at home.   Left-handed.   1-2 cups per day.   Immunization History  Administered Date(s) Administered   Influenza, High Dose Seasonal PF 04/29/2016, 05/03/2017   Influenza-Unspecified 05/21/2010, 04/28/2013, 05/17/2014, 04/30/2015, 05/03/2017, 05/10/2018   PFIZER(Purple Top)SARS-COV-2 Vaccination 09/02/2019, 09/23/2019, 05/06/2020   Pfizer Covid-19 Vaccine Bivalent Booster 70yrs & up 05/23/2021   Pneumococcal Conjugate-13 03/29/2014   Pneumococcal Polysaccharide-23 05/21/2010, 09/05/2018   Zoster Recombinant(Shingrix) 12/10/2017, 04/08/2018   Zoster, Live 10/19/2007     Objective: Vital Signs: BP 109/71  (BP Location: Left Arm, Patient Position: Sitting, Cuff Size: Normal)   Pulse 67   Resp 13   Ht 5\' 1"  (1.549 m)   Wt 116 lb 12.8 oz (53 kg)   BMI 22.07 kg/m    Physical Exam Vitals and nursing note reviewed.  Constitutional:      Appearance: She is well-developed.  HENT:     Head: Normocephalic and atraumatic.  Eyes:     Conjunctiva/sclera: Conjunctivae normal.  Cardiovascular:     Rate and Rhythm: Normal rate and regular rhythm.     Heart sounds: Normal heart sounds.  Pulmonary:     Effort: Pulmonary effort is normal.     Breath sounds: Normal breath sounds.  Abdominal:     General: Bowel sounds are normal.     Palpations: Abdomen is soft.  Musculoskeletal:     Cervical back: Normal range of motion.  Lymphadenopathy:     Cervical: No cervical adenopathy.  Skin:    General: Skin is warm and dry.     Capillary Refill: Capillary refill takes less than 2 seconds.  Neurological:     Mental Status: She is alert  and oriented to person, place, and time.  Psychiatric:        Behavior: Behavior normal.      Musculoskeletal Exam: She had tenderness over bilateral TMJs.  She had limited lateral rotation of the cervical spine.  Thoracic kyphosis was noted.  Thoracolumbar scoliosis was noted.  Shoulder joints, elbow joints, wrist joints, MCPs PIPs and DIPs were in good range of motion.  She had bilateral CMC PIP and DIP thickening with no synovitis.  Hip joints and knee joints were in good range of motion.  No warmth swelling or effusion was noted.  There was no tenderness over ankles or MTPs.  Mild pedal edema was noted.  CDAI Exam: CDAI Score: -- Patient Global: 20 / 100; Provider Global: 10 / 100 Swollen: --; Tender: -- Joint Exam 03/10/2023   No joint exam has been documented for this visit   There is currently no information documented on the homunculus. Go to the Rheumatology activity and complete the homunculus joint exam.  Investigation: No additional  findings.  Imaging: No results found.  Recent Labs: Lab Results  Component Value Date   WBC 3.6 (L) 03/08/2023   HGB 12.4 03/08/2023   PLT 206 03/08/2023   NA 138 03/08/2023   K 4.3 03/08/2023   CL 102 03/08/2023   CO2 25 03/08/2023   GLUCOSE 89 03/08/2023   BUN 15 03/08/2023   CREATININE 0.65 03/08/2023   BILITOT 0.3 03/08/2023   ALKPHOS 84 12/07/2019   AST 18 03/08/2023   ALT 14 03/08/2023   PROT 6.4 03/08/2023   ALBUMIN 4.4 12/07/2019   CALCIUM 9.1 03/08/2023   GFRAA 100 02/04/2021    Speciality Comments: PLQ eye exam: 09/15/2022 WNL @ [facility] Follow up in 1 year  Procedures:  No procedures performed Allergies: Ciprofloxacin, Terbinafine, and Lamisil at [powders]   Assessment / Plan:     Visit Diagnoses: Rheumatoid arthritis involving multiple sites with positive rheumatoid factor (HCC)-patient design is having a flare of rheumatoid arthritis.  No synovitis was noted on the examination.  She will continue Plaquenil 200 mg p.o. daily.  High risk medication use - Plaquenil 200 mg 1 tablet by mouth daily. PLQ eye exam: 09/15/2022.  Labs from March 08, 2023 CBC showed WBC count 3.6.  Will continue to monitor labs.  CMP was normal.  Information on immunization was placed in the AVS.  Sjogren's syndrome with other organ involvement (HCC) - Repeat ANA negative (03/27/20), Ro+, La-: She continues to have sicca symptoms.  Over-the-counter products were discussed.   Raynaud's syndrome without gangrene-Raynauds is currently not active.  She has more symptoms during the winter months. Keeping core temperature warm and warm clothing was discussed.  She had decreased capillary refill without any nailbed capillary changes or sclerodactyly.  Primary osteoarthritis of both hands-bilateral PIP and DIP thickening was noted.  Joint protection muscle strengthening was discussed.  Spinal stenosis of cervical region-she had good mobility in the cervical spine with some limitation  with lateral rotation.  History of scoliosis - Thoracolumbar scoliosis.  Age-related osteoporosis without current pathological fracture - DEXA updated on 09/15/2021: Left radius BMD 0.537 with T score -2.6. she cannot take oral bisphosphonates due to Barrett's esophagus.  Patient is not interested in any treatment at this point.  Use of calcium rich diet and vitamin D was discussed.  Resistive exercises were discussed.  Chronic TMJ pain - Right-she has been having discomfort since she had crown procedure.  Other medical problems listed as follows:  History of hyperlipidemia  History of gastroesophageal reflux (GERD)  History of diverticulitis  History of hypothyroidism  Orders: No orders of the defined types were placed in this encounter.  No orders of the defined types were placed in this encounter.    Follow-Up Instructions: Return in about 5 months (around 08/10/2023) for Rheumatoid arthritis, Osteoarthritis.   Pollyann Savoy, MD  Note - This record has been created using Animal nutritionist.  Chart creation errors have been sought, but may not always  have been located. Such creation errors do not reflect on  the standard of medical care.

## 2023-02-25 ENCOUNTER — Other Ambulatory Visit: Payer: Self-pay | Admitting: Family Medicine

## 2023-02-25 DIAGNOSIS — Z1231 Encounter for screening mammogram for malignant neoplasm of breast: Secondary | ICD-10-CM

## 2023-03-05 ENCOUNTER — Other Ambulatory Visit: Payer: Self-pay

## 2023-03-05 DIAGNOSIS — Z79899 Other long term (current) drug therapy: Secondary | ICD-10-CM

## 2023-03-05 DIAGNOSIS — M0579 Rheumatoid arthritis with rheumatoid factor of multiple sites without organ or systems involvement: Secondary | ICD-10-CM

## 2023-03-05 NOTE — Telephone Encounter (Signed)
Refill request received via fax from [facility] for PLQ. Called patient to confirm this is the correct pharmacy since rx was previously sent to [facility]. Patient now uses [facility] with new insurance. Chart has been updated accordingly.   Last Fill: 12/14/2022  Eye exam: 09/15/2022   Labs: 09/29/2022 CBC is normal.  Potassium is elevated, probably a hemolyzed sample.  Patient is not on potassium supplement.   Next Visit: 03/10/2023  Last Visit: 10/06/2022  EX:[id number] arthritis involving multiple sites with positive rheumatoid factor   Current Dose per office note on 10/06/2022: Plaquenil 200 mg 1 tablet by mouth daily.   Patient states she will update labs on Monday or Tuesday prior to appt on 03/10/2023. Please review and sign pended lab orders as well.   Would you like patient to update any Sjogren's labs?  Okay to refill Plaquenil?

## 2023-03-07 MED ORDER — HYDROXYCHLOROQUINE SULFATE 200 MG PO TABS: 200.0000 mg | ORAL_TABLET | Freq: Every day | ORAL | 0 refills | Status: DC

## 2023-03-08 ENCOUNTER — Other Ambulatory Visit: Payer: Self-pay | Admitting: *Deleted

## 2023-03-08 DIAGNOSIS — Z79899 Other long term (current) drug therapy: Secondary | ICD-10-CM

## 2023-03-08 LAB — CBC WITH DIFFERENTIAL/PLATELET
Absolute Monocytes: 364 cells/uL (ref 200–950)
Basophils Absolute: 29 cells/uL (ref 0–200)
Basophils Relative: 0.8 %
Eosinophils Absolute: 79 cells/uL (ref 15–500)
Eosinophils Relative: 2.2 %
HCT: 37.7 % (ref 35.0–45.0)
Hemoglobin: 12.4 g/dL (ref 11.7–15.5)
Lymphs Abs: 1163 cells/uL (ref 850–3900)
MCH: 31.9 pg (ref 27.0–33.0)
MCHC: 32.9 g/dL (ref 32.0–36.0)
MCV: 96.9 fL (ref 80.0–100.0)
MPV: 10.6 fL (ref 7.5–12.5)
Monocytes Relative: 10.1 %
Neutro Abs: 1966 cells/uL (ref 1500–7800)
Neutrophils Relative %: 54.6 %
Platelets: 206 10*3/uL (ref 140–400)
RBC: 3.89 10*6/uL (ref 3.80–5.10)
RDW: 11.2 % (ref 11.0–15.0)
Total Lymphocyte: 32.3 %
WBC: 3.6 10*3/uL — ABNORMAL LOW (ref 3.8–10.8)

## 2023-03-09 NOTE — Progress Notes (Signed)
White cell count is low at 3.6 due to immunosuppression.  Will continue to monitor white cell count closely.  CMP is normal.

## 2023-03-10 ENCOUNTER — Ambulatory Visit: Payer: Medicare HMO | Attending: Rheumatology | Admitting: Rheumatology

## 2023-03-10 ENCOUNTER — Encounter: Payer: Self-pay | Admitting: Rheumatology

## 2023-03-10 VITALS — BP 109/71 | HR 67 | Resp 13 | Ht 61.0 in | Wt 116.8 lb

## 2023-03-10 DIAGNOSIS — M810 Age-related osteoporosis without current pathological fracture: Secondary | ICD-10-CM

## 2023-03-10 DIAGNOSIS — U071 COVID-19: Secondary | ICD-10-CM

## 2023-03-10 DIAGNOSIS — M19041 Primary osteoarthritis, right hand: Secondary | ICD-10-CM

## 2023-03-10 DIAGNOSIS — G8929 Other chronic pain: Secondary | ICD-10-CM

## 2023-03-10 DIAGNOSIS — I7300 Raynaud's syndrome without gangrene: Secondary | ICD-10-CM

## 2023-03-10 DIAGNOSIS — M3509 Sicca syndrome with other organ involvement: Secondary | ICD-10-CM | POA: Diagnosis not present

## 2023-03-10 DIAGNOSIS — Z79899 Other long term (current) drug therapy: Secondary | ICD-10-CM | POA: Diagnosis not present

## 2023-03-10 DIAGNOSIS — Z8719 Personal history of other diseases of the digestive system: Secondary | ICD-10-CM

## 2023-03-10 DIAGNOSIS — M0579 Rheumatoid arthritis with rheumatoid factor of multiple sites without organ or systems involvement: Secondary | ICD-10-CM | POA: Diagnosis not present

## 2023-03-10 DIAGNOSIS — M26629 Arthralgia of temporomandibular joint, unspecified side: Secondary | ICD-10-CM

## 2023-03-10 DIAGNOSIS — Z8739 Personal history of other diseases of the musculoskeletal system and connective tissue: Secondary | ICD-10-CM

## 2023-03-10 DIAGNOSIS — M19042 Primary osteoarthritis, left hand: Secondary | ICD-10-CM

## 2023-03-10 DIAGNOSIS — Z8639 Personal history of other endocrine, nutritional and metabolic disease: Secondary | ICD-10-CM

## 2023-03-10 DIAGNOSIS — M4802 Spinal stenosis, cervical region: Secondary | ICD-10-CM

## 2023-03-10 NOTE — Patient Instructions (Addendum)
  Standing Labs We placed an order today for your standing lab work.   Please have your standing labs drawn in December and every 5 months  Please have your labs drawn 2 weeks prior to your appointment so that the provider can discuss your lab results at your appointment, if possible.  Please note that you may see your imaging and lab results in MyChart before we have reviewed them. We will contact you once all results are reviewed. Please allow our office up to 72 hours to thoroughly review all of the results before contacting the office for clarification of your results.  WALK-IN LAB HOURS  Monday through Thursday from 8:00 am -12:30 pm and 1:00 pm-5:00 pm and Friday from 8:00 am-12:00 pm.  Patients with office visits requiring labs will be seen before walk-in labs.  You may encounter longer than normal wait times. Please allow additional time. Wait times may be shorter on  Monday and Thursday afternoons.  We do not book appointments for walk-in labs. We appreciate your patience and understanding with our staff.   Labs are drawn by [facility]. Please bring your co-pay at the time of your lab draw.  You may receive a bill from [facility] for your lab work.  Please note if you are on Hydroxychloroquine and and an order has been placed for a Hydroxychloroquine level,  you will need to have it drawn 4 hours or more after your last dose.  If you wish to have your labs drawn at another location, please call the office 24 hours in advance so we can fax the orders.  The office is located at [address], [address], [place], [place] [address]   If you have any questions regarding directions or hours of operation,  please call [phone].   As a reminder, please drink plenty of water prior to coming for your lab work. Thanks!  Vaccines You are taking a medication(s) that can suppress your immune system.  The following immunizations are recommended: Flu annually Covid-19  RSV Td/Tdap (tetanus,  diphtheria, pertussis) every 10 years Pneumonia (Prevnar 15 then Pneumovax 23 at least 1 year apart.  Alternatively, can take Prevnar 20 without needing additional dose) Shingrix: 2 doses from 4 weeks to 6 months apart  Please check with your PCP to make sure you are up to date.

## 2023-04-13 ENCOUNTER — Ambulatory Visit
Admission: RE | Admit: 2023-04-13 | Discharge: 2023-04-13 | Disposition: A | Discharge status: Home / Self Care | Payer: Medicare HMO | Source: Ambulatory Visit | Attending: Family Medicine | Admitting: Family Medicine

## 2023-04-13 DIAGNOSIS — Z1231 Encounter for screening mammogram for malignant neoplasm of breast: Secondary | ICD-10-CM

## 2023-05-03 ENCOUNTER — Ambulatory Visit: Payer: Medicare HMO | Admitting: Cardiology

## 2023-05-25 ENCOUNTER — Encounter: Payer: Self-pay | Admitting: Cardiology

## 2023-05-25 ENCOUNTER — Ambulatory Visit: Payer: Medicare HMO | Attending: Cardiology | Admitting: Cardiology

## 2023-05-25 VITALS — BP 104/60 | HR 78 | Ht 61.0 in | Wt 116.2 lb

## 2023-05-25 DIAGNOSIS — I4719 Other supraventricular tachycardia: Secondary | ICD-10-CM | POA: Diagnosis not present

## 2023-05-25 NOTE — Patient Instructions (Signed)
Medication Instructions:  Your physician recommends that you continue on your current medications as directed. Please refer to the Current Medication list given to you today.  *If you need a refill on your cardiac medications before your next appointment, please call your pharmacy*  Lab Work: If you have labs (blood work) drawn today and your tests are completely normal, you will receive your results only by: MyChart Message (if you have MyChart) OR A paper copy in the mail If you have any lab test that is abnormal or we need to change your treatment, we will call you to review the results.  Follow-Up: At [facility], you and your health needs are our priority.  As part of our continuing mission to provide you with exceptional heart care, we have created designated Provider Care Teams.  These Care Teams include your primary Cardiologist (physician) and Advanced Practice Providers (APPs -  Physician Assistants and Nurse Practitioners) who all work together to provide you with the care you need, when you need it.  We recommend signing up for the patient portal called "MyChart".  Sign up information is provided on this After Visit Summary.  MyChart is used to connect with patients for Virtual Visits (Telemedicine).  Patients are able to view lab/test results, encounter notes, upcoming appointments, etc.  Non-urgent messages can be sent to your provider as well.   To learn more about what you can do with MyChart, go to [email or web address].    Your next appointment:   As needed  Provider:   Elder Negus, MD      Diet & Lifestyle recommendations:  Physical activity recommendation (The Physical Activity Guidelines for Americans. JAMA 2018;Nov 12) At least 150-300 minutes a week of moderate-intensity, or 75-150 minutes a week of vigorous-intensity aerobic physical activity, or an equivalent combination of moderate- and vigorous-intensity aerobic activity. Adults should perform  muscle-strengthening activities on 2 or more days a week. Older adults should do multicomponent physical activity that includes balance training as well as aerobic and muscle-strengthening activities. Benefits of increased physical activity include lower risk of mortality including cardiovascular mortality, lower risk of cardiovascular events and associated risk factors (hypertension and diabetes), and lower risk of many cancers (including bladder, breast, colon, endometrium, esophagus, kidney, lung, and stomach). Additional improvments have been seen in cognition, risk of dementia, anxiety and depression, improved bone health, lower risk of falls, and associated injuries.  Dietary recommendation The 2019 ACC/AHA guidelines promote nutrition as a main fixture of cardiovascular wellness, with a recommendation for a varied diet of fruit, vegetables, fish, legumes, and whole grains (Class I), as well as recommendations to reduce sodium, cholesterol, processed meats, and refined sugars (Class IIa recommendation).10 Sodium intake, a topic of some controversy as of late, is recommended to be kept at 1,500 mg/day or less, far below the average daily intake in the [place] of 3,409 mg/day, and notably below that of previous [place] recommendations for 300mg /day.10,11 For those unable to reach 1,500 mg/day, they recommend at least a reduction of 1000 mg/day.  A Pesco-Mediterranean Diet With Intermittent Fasting: JACC Review Topic of the Week. J Am Coll Cardiol 2020;76:1484-1493 Pesco-Mediterranean diet, it is supplemented with extra-virgin olive oil (EVOO), which is the principle fat source, along with moderate amounts of dairy (particularly yogurt and cheese) and eggs, as well as modest amounts of alcohol consumption (ideally red wine with the evening meal), but few red and processed meats.

## 2023-05-25 NOTE — Progress Notes (Signed)
  Cardiology Office Note:  .   Date:  05/25/2023  ID:  Lamar Laundry Meigan Pates, DOB 1945/11/06, MRN [id number] PCP: Richmond Campbell PA-C  [facility] Providers Cardiologist:  Truett Mainland, MD PCP: Richmond Campbell., PA-C  Chief Complaint  Patient presents with   Follow-up    Palpitations      History of Present Illness: Marland Kitchen    Virdia Ziesmer Janara Klett is a 77 y.o. female with hypothyroidism, Rheumatoid arthritis, Sjogren's, ?h/o TIA, palpitations   Patient is doing well.  She is not having any frequent palpitation episodes.  They occur only occasionally.  She has not taken any diltiazem owing to systolic blood pressure that usually runs <100 mmHg.  Vitals:   05/25/23 1409  BP: 104/60  Pulse: 78  SpO2: 96%     ROS:  Review of Systems  Cardiovascular:  Positive for palpitations (Occasional). Negative for chest pain, dyspnea on exertion, leg swelling and syncope.     Studies Reviewed: Marland Kitchen       Mobile cardiac telemetry 13 days 09/24/2022 - 10/08/2022: Dominant rhythm: Sinus. HR 53-116 bpm. Avg HR 75 bpm, in sinus rhythm. 47 episodes of probable atrial tachycardia/SVT, fastest at 169 bpm for 8 beats, longest for 11 beats at 109 bpm. 1.5% isolated SVE, <1% couplet/triplets. 0 episodes of VT. <1% isolated VE, no couplet/triplets. No atrial fibrillation/atrial flutter/VT/high grade AV block, sinus pause >3sec noted. 7 patient triggered events, correlated with SVE.   Echocardiogram 09/24/2022:  Normal LV systolic function with visual EF 60-65%. Left ventricle cavity  is normal in size. Normal left ventricular wall thickness. Normal global  wall motion. Indeterminate diastolic filling pattern. Calculated EF 68%.  Structurally normal mitral valve.  Mild (Grade I) mitral regurgitation.  Structurally normal tricuspid valve with trace regurgitation. No evidence  of pulmonary hypertension.  No prior available for comparison.     Physical Exam:    Physical Exam Vitals and nursing note reviewed.  Constitutional:      General: She is not in acute distress. Neck:     Vascular: No JVD.  Cardiovascular:     Rate and Rhythm: Normal rate and regular rhythm.     Heart sounds: Normal heart sounds. No murmur heard. Pulmonary:     Effort: Pulmonary effort is normal.     Breath sounds: Normal breath sounds. No wheezing or rales.      VISIT DIAGNOSES:   ICD-10-CM   1. Paroxysmal atrial tachycardia (HCC)  I47.19        ASSESSMENT AND PLAN: .    Juni Glaab is a 77 y.o. female with hypothyroidism, Rheumatoid arthritis, Sjogren's, ?h/o TIA, paroxysmal atrial tachycardia  Palpitations: Structurally normal heart Short lasting atrial tachycardia/SVT noted on monitor. No Afib seen. I encouraged patient to use [facility] mobile for future monitoring of her symptoms.  F/u as needed   Signed, Elder Negus, MD

## 2023-07-20 ENCOUNTER — Ambulatory Visit: Payer: Medicare HMO | Admitting: Gastroenterology

## 2023-07-20 ENCOUNTER — Encounter: Payer: Self-pay | Admitting: Gastroenterology

## 2023-07-20 VITALS — BP 112/60 | HR 84 | Ht 61.0 in | Wt 116.4 lb

## 2023-07-20 DIAGNOSIS — K2270 Barrett's esophagus without dysplasia: Secondary | ICD-10-CM

## 2023-07-20 DIAGNOSIS — K219 Gastro-esophageal reflux disease without esophagitis: Secondary | ICD-10-CM

## 2023-07-20 DIAGNOSIS — R1310 Dysphagia, unspecified: Secondary | ICD-10-CM

## 2023-07-20 DIAGNOSIS — K5909 Other constipation: Secondary | ICD-10-CM

## 2023-07-20 NOTE — Progress Notes (Signed)
    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  Titrate MiraLAX dosing for complete bowel movement qd to qod REV with Dr. Doy Hutching in 6 weeks   HPI    This is a 77 year old female with GERD, dysphagia and constipation.  She was evaluated for the same in 2022. See 10/01/2020 and 01/31/2021 GI office notes and barium esophagram below.  Her last EGD and colonoscopy were performed in Jan 2019 by Dr. Lavon Paganini.  EGD showed irregular Z-line with biopsies showing focal intestinal metaplasia.  Colonoscopy revealed sigmoid diverticulosis.  There was a mucosal tear in the recto-sigmoid colon to which MR conditional clips were applied.  Unfortunately she had a 4 day hospital stay after her EGD and colonoscopy.  Initially it was thought that she had anaphylaxis / angioedema to Propofol, however it was determined that she likely had pneumomediastinum, pneumothorax, pneumoperitoneum and subcutaneous emphysema with associated acute respiratory failure related to perforation during colonoscopy.  There was no obvious perforated viscus noted on imaging however substantial free air and subcutaneous emphysema.   Labs / Imaging    Barium esophagram IMPRESSION 10/15/2020: 1. Study is positive for a large amount of gastroesophageal reflux. 2. Otherwise, normal single contrast esophagram, as above.     Latest Ref Rng & Units 03/08/2023    2:28 PM 09/29/2022    2:49 PM 05/01/2022    2:57 PM  Hepatic Function  Total Protein 6.1 - 8.1 g/dL 6.4  6.7  6.8   AST 10 - 35 U/L 18  21  17    ALT 6 - 29 U/L 14  19  18    Total  Bilirubin 0.2 - 1.2 mg/dL 0.3  0.2  0.3        Latest Ref Rng & Units 03/08/2023    2:28 PM 09/29/2022    2:49 PM 05/01/2022    2:57 PM  CBC  WBC 3.8 - 10.8 Thousand/uL 3.6  3.9  3.8   Hemoglobin 11.7 - 15.5 g/dL 40.9  81.1  91.4   Hematocrit 35.0 - 45.0 % 37.7  37.7  38.5   Platelets 140 - 400 Thousand/uL 206  232  223    Current Medications, Allergies, Past Medical History, Past Surgical History, Family History and Social History were reviewed in [employer] record.   Physical Exam: General: Well developed, well nourished, no acute distress Head: Normocephalic and atraumatic Eyes: Sclerae anicteric, EOMI Ears: Normal auditory acuity Mouth: No deformities or lesions noted Lungs: Clear throughout to auscultation Heart: Regular rate and rhythm; No murmurs, rubs or bruits Abdomen: Soft, non tender and non distended. No masses, hepatosplenomegaly or hernias noted. Normal Bowel sounds Rectal: Not done Musculoskeletal: Symmetrical with no gross deformities  Pulses:  Normal pulses noted Extremities: No edema or deformities noted Neurological: Alert oriented x 4, grossly nonfocal Psychological:  Alert and cooperative. Normal mood and affect   Giovanny Dugal T. Russella Dar, MD 07/20/2023, 1:11 PM

## 2023-07-20 NOTE — Patient Instructions (Signed)
Continue both Miralax and Metamucil daily and titrate depending on your bowel movements.   You have been scheduled for a Barium Esophogram at [facility] (1st floor of the hospital) on 07/22/23 at 11:00am. Please arrive 30 minutes prior to your appointment for registration. Make certain not to have anything to eat or drink 3 hours prior to your test. If you need to reschedule for any reason, please contact radiology at [phone] to do so. __________________________________________________________________ A barium swallow is an examination that concentrates on views of the esophagus. This tends to be a double contrast exam (barium and two liquids which, when combined, create a gas to distend the wall of the oesophagus) or single contrast (non-ionic iodine based). The study is usually tailored to your symptoms so a good history is essential. Attention is paid during the study to the form, structure and configuration of the esophagus, looking for functional disorders (such as aspiration, dysphagia, achalasia, motility and reflux) EXAMINATION You may be asked to change into a gown, depending on the type of swallow being performed. A radiologist and radiographer will perform the procedure. The radiologist will advise you of the type of contrast selected for your procedure and direct you during the exam. You will be asked to stand, sit or lie in several different positions and to hold a small amount of fluid in your mouth before being asked to swallow while the imaging is performed .In some instances you may be asked to swallow barium coated marshmallows to assess the motility of a solid food bolus. The exam can be recorded as a digital or video fluoroscopy procedure. POST PROCEDURE It will take 1-2 days for the barium to pass through your system. To facilitate this, it is important, unless otherwise directed, to increase your fluids for the next 24-48hrs and to resume your normal diet.  This test  typically takes about 30 minutes to perform. __________________________________________________________________________________ The [facility] providers would like to encourage you to use [facility] to communicate with providers for non-urgent requests or questions.  Due to long hold times on the telephone, sending your provider a message by [facility] may be a faster and more efficient way to get a response.  Please allow 48 business hours for a response.  Please remember that this is for non-urgent requests.   Due to recent changes in healthcare laws, you may see the results of your imaging and laboratory studies on MyChart before your provider has had a chance to review them.  We understand that in some cases there may be results that are confusing or concerning to you. Not all laboratory results come back in the same time frame and the provider may be waiting for multiple results in order to interpret others.  Please give [place] 48 hours in order for your provider to thoroughly review all the results before contacting the office for clarification of your results.   Thank you for choosing me and [facility].  Venita Lick. Pleas Koch., MD., Clementeen Graham

## 2023-07-22 ENCOUNTER — Ambulatory Visit (HOSPITAL_COMMUNITY): Payer: Medicare HMO

## 2023-07-26 NOTE — Progress Notes (Signed)
Office Visit Note  Patient: Linda Berg             Date of Birth: Mar 07, 1946           MRN: [id number]             PCP: Richmond Campbell., PA-C Referring: Richmond Campbell., PA-C Visit Date: 08/09/2023 Occupation: @GUAROCC @  Subjective:  Left shoulder pain   History of Present Illness: Linda Berg is a 77 y.o. female with history of seropositive rheumatoid arthritis, sjogren's, and osteoporosis.  Patient remains on Plaquenil 200 mg 1 tablet by mouth daily.  She continues to tolerate Plaquenil without any side effects and has not had any recent gaps in therapy.  Patient presents today with ongoing pain involving the left shoulder.  Patient reports that her symptoms started about 1 year ago with no identifiable trigger.  She has not had any recent injury or fall.  She has been under the care of orthopedics.  She had a left shoulder injection 7 8 months ago which righted significant relief but her symptoms recurred.  She had a repeat left shoulder injection on 06/29/2023 which provided temporary relief for about 2 weeks.  Since then she has had constant pain throughout the day as well as at night.  She has tried Voltaren gel and IcyHot as well as over-the-counter oral products for pain relief.  Her pain is currently an 8 out of 10.  Patient reports that the initial MRI ordered by orthopedics has been declined that she is scheduled for an MRI of the left shoulder in January 2025.  She has not yet tried PT and has not had a x-ray of the left shoulder.     Activities of Daily Living:  Patient reports morning stiffness for 24 hours.   Patient Reports nocturnal pain.  Difficulty dressing/grooming: Reports Difficulty climbing stairs: Denies Difficulty getting out of chair: Denies Difficulty using hands for taps, buttons, cutlery, and/or writing: Reports  Review of Systems  Constitutional:  Positive for fatigue.  HENT:  Positive for mouth dryness. Negative  for mouth sores.   Eyes:  Positive for dryness.  Respiratory:  Negative for shortness of breath.   Cardiovascular:  Positive for palpitations. Negative for chest pain.  Gastrointestinal:  Positive for constipation and diarrhea. Negative for blood in stool.  Endocrine: Positive for increased urination.  Genitourinary:  Positive for involuntary urination.  Musculoskeletal:  Positive for joint pain, joint pain, joint swelling, myalgias, morning stiffness and myalgias. Negative for gait problem, muscle weakness and muscle tenderness.  Skin:  Negative for color change, rash, hair loss and sensitivity to sunlight.  Allergic/Immunologic: Negative for susceptible to infections.  Neurological:  Negative for dizziness and headaches.  Hematological:  Negative for swollen glands.  Psychiatric/Behavioral:  Positive for sleep disturbance. Negative for depressed mood. The patient is not nervous/anxious.     PMFS History:  Patient Active Problem List   Diagnosis Date Noted   Paroxysmal atrial tachycardia (HCC) 05/25/2023   Irregular heart beat 09/16/2022   Language difficulty 08/17/2018   Nonintractable headache 08/17/2018   Upper airway cough syndrome 10/13/2017   Pneumomediastinum (HCC)    Pneumoperitoneum    Acute respiratory failure (HCC)    Pneumothorax, traumatic    Anaphylaxis 08/17/2017   Trochanteric bursitis of both hips 03/16/2017   Rheumatoid arthritis involving multiple sites with positive rheumatoid factor (HCC) 10/09/2016   Sicca syndrome (HCC) 10/09/2016   Osteopenia of multiple sites 10/09/2016   History  of scoliosis 10/09/2016   History of gastroesophageal reflux (GERD) 10/09/2016   History of hypothyroidism 10/09/2016   History of hyperlipidemia 10/09/2016   High risk medication use 10/09/2016   Primary osteoarthritis of both hands 10/09/2016   Paresthesia 10/11/2014   Hyperlipidemia 10/11/2014   Small vessel disease, 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/11/2014   Tinnitus 09/17/2014    Abdominal pain 09/05/2014   Acute cystitis 09/05/2014   Arthritis 09/05/2014   Cough 09/05/2014   Esophagitis 09/05/2014   Facial numbness 09/05/2014   Difficulty hearing 09/05/2014   H/O disease 09/05/2014   HLD (hyperlipidemia) 09/05/2014   Muscle ache 09/05/2014   Symptoms involving urinary system 09/05/2014   Awareness of heartbeats 09/05/2014   Acne erythematosa 09/05/2014   Absence of bladder continence 09/05/2014   Urinary urgency 09/05/2014   Routine general medical examination at a health care facility 09/05/2014   DYSPHAGIA 01/09/2009   HYPOTHYROIDISM 11/04/2007   DYSLIPIDEMIA 11/04/2007   GERD 11/04/2007   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/04/2007   DUODENITIS, WITHOUT HEMORRHAGE 11/04/2007   HIATAL HERNIA 11/04/2007   DIVERTICULOSIS, COLON 11/04/2007   CONSTIPATION, CHRONIC 11/04/2007   RECTAL BLEEDING 11/04/2007    Past Medical History:  Diagnosis Date   Barrett esophagus    Diverticulosis    Dyslipidemia    Facial numbness    GERD (gastroesophageal reflux disease)    Hearing loss    Hiatal hernia    Hypothyroidism    Rheumatoid arteritis (HCC)     Family History  Problem Relation Age of Onset   Heart disease Mother    Rheumatic fever Mother    Heart failure Mother    Stroke Father    Diabetes Father    Heart failure Father    Heart disease Maternal Aunt        aunt x 2   Irritable bowel syndrome Son    GER disease Son    Colon cancer Neg Hx    Past Surgical History:  Procedure Laterality Date   CATARACT EXTRACTION, BILATERAL     04/13/2019, 04/27/2019   CHEST TUBE INSERTION  08/17/2017   COLONOSCOPY  08/17/2017   HAMMER TOE SURGERY Left    INTRAUTERINE DEVICE INSERTION     IUD REMOVAL     KNEE ARTHROPLASTY     KNEE ARTHROSCOPY  12/25/11   left   LEFT OOPHORECTOMY  03/13/98   MANDIBLE SURGERY  9/93   pallete expansion   TONSILLECTOMY     TUBAL LIGATION     Social History   Social History Narrative   Live with husband at home.   Left-handed.   1-2  cups per day.   Immunization History  Administered Date(s) Administered   Influenza, High Dose Seasonal PF 04/29/2016, 05/03/2017   Influenza-Unspecified 05/21/2010, 04/28/2013, 05/17/2014, 04/30/2015, 05/03/2017, 05/10/2018   PFIZER(Purple Top)SARS-COV-2 Vaccination 09/02/2019, 09/23/2019, 05/06/2020   Pfizer Covid-19 Vaccine Bivalent Booster 14yrs & up 05/23/2021   Pneumococcal Conjugate-13 03/29/2014   Pneumococcal Polysaccharide-23 05/21/2010, 09/05/2018   Zoster Recombinant(Shingrix) 12/10/2017, 04/08/2018   Zoster, Live 10/19/2007     Objective: Vital Signs: BP 111/71 (BP Location: Right Arm, Patient Position: Sitting, Cuff Size: Normal)   Pulse 76   Resp 14   Ht 5\' 1"  (1.549 m)   Wt 118 lb (53.5 kg)   BMI 22.30 kg/m    Physical Exam Vitals and nursing note reviewed.  Constitutional:      Appearance: She is well-developed.  HENT:     Head: Normocephalic and atraumatic.  Eyes:     Conjunctiva/sclera:  Conjunctivae normal.  Cardiovascular:     Rate and Rhythm: Normal rate and regular rhythm.     Heart sounds: Normal heart sounds.  Pulmonary:     Effort: Pulmonary effort is normal.     Breath sounds: Normal breath sounds.  Abdominal:     General: Bowel sounds are normal.     Palpations: Abdomen is soft.  Musculoskeletal:     Cervical back: Normal range of motion.  Lymphadenopathy:     Cervical: No cervical adenopathy.  Skin:    General: Skin is warm and dry.     Capillary Refill: Capillary refill takes less than 2 seconds.  Neurological:     Mental Status: She is alert and oriented to person, place, and time.  Psychiatric:        Behavior: Behavior normal.      Musculoskeletal Exam: C-spine has limited ROM with lateral rotation.  Thoracic kyphosis noted. Right shoulder has full ROM.  Left shoulder has painful and limited internal rotation.   Elbow joints, wrist joints, MCPs, PIPs, and DIPs good ROM with no synovitis.  Complete fist formation bilaterally.  CMC  joint, PIP, and DIP thickening consistent with osteoarthritis of both hands.  Hip joints have good ROM with no groin pain.  Knee joints have good ROM with no warmth or effusion.  Ankle joints have good ROM with no tenderness or joint swelling.   CDAI Exam: CDAI Score: 8  Patient Global: 50 / 100; Provider Global: 20 / 100 Swollen: 0 ; Tender: 1  Joint Exam 08/09/2023      Right  Left  Glenohumeral      Tender     Investigation: No additional findings.  Imaging: DG ESOPHAGUS W DOUBLE CM (HD) Result Date: 07/27/2023 CLINICAL DATA:  Dysphagia unspecified. EXAM: ESOPHOGRAM / BARIUM SWALLOW / BARIUM TABLET STUDY TECHNIQUE: Combined double contrast and single contrast examination performed using effervescent crystals, thick barium liquid, and thin barium liquid. The patient was observed with fluoroscopy swallowing a 13 mm barium sulphate tablet. FLUOROSCOPY: Radiation Exposure Index (as provided by the fluoroscopic device): 4.7 mGy Kerma COMPARISON:  10/15/2020 FINDINGS: Hypopharyngeal portion of the exam is unremarkable. Double contrast evaluation of the esophagus demonstrates no mucosal abnormality. Evaluation of esophageal motility demonstrates proximal escape waves and mild tertiary contractions with contrast stasis throughout the esophagus. Full column evaluation of the esophagus demonstrates no persistent narrowing or stricture. No hiatal hernia. Gastroesophageal reflux to the mid thoracic esophagus with water swallows. A 13 mm barium tablet passes promptly. IMPRESSION: Moderate esophageal dysmotility, likely presbyesophagus. Gastroesophageal reflux with water swallowing. Electronically Signed   By: Jeronimo Greaves M.D.   On: 07/27/2023 10:57    Recent Labs: Lab Results  Component Value Date   WBC 5.9 08/05/2023   HGB 13.5 08/05/2023   PLT 281 08/05/2023   NA 136 08/05/2023   K 4.3 08/05/2023   CL 100 08/05/2023   CO2 26 08/05/2023   GLUCOSE 73 08/05/2023   BUN 19 08/05/2023    CREATININE 0.63 08/05/2023   BILITOT 0.3 08/05/2023   ALKPHOS 84 12/07/2019   AST 22 08/05/2023   ALT 22 08/05/2023   PROT 7.7 08/05/2023   ALBUMIN 4.4 12/07/2019   CALCIUM 9.7 08/05/2023   GFRAA 100 02/04/2021    Speciality Comments: PLQ eye exam: 09/15/2022 WNL @ [facility] Follow up in 1 year  Procedures:  No procedures performed Allergies: Ciprofloxacin, Terbinafine, and Lamisil at [powders]    Assessment / Plan:  Visit Diagnoses: Rheumatoid arthritis involving multiple sites with positive rheumatoid factor (HCC): She has no synovitis on examination today.  She has not had any signs or symptoms of a rheumatoid arthritis flare.  She has clinically been doing well taking Plaquenil 200 mg 1 tablet by mouth daily.  She has been experiencing chronic pain involving the left shoulder for the past 1 year.  No injury or fall prior to the onset of symptoms.  She has been under the care of orthopedics and has had 2 cortisone injections with only temporary relief.  She is currently awaiting approval for MRI of the left shoulder for further evaluation.  X-rays of the left shoulder were obtained since the MRI was declined.  Patient plans on continuing to work with her orthopedic surgeon to get the MRI approved for further evaluation.  Overall her rheumatoid arthritis remains well-controlled taking Plaquenil as monotherapy.  She was advised to notify us if she develops signs or symptoms of a flare.  She will follow-up in the office in 5 months or sooner if needed.  High risk medication use - Plaquenil 200 mg 1 tablet by mouth daily.  PLQ eye exam: 09/15/2022 WNL @ [facility] Follow up in 1 year.  Patient is scheduled for an upcoming Plaquenil examination.  CBC and CMP updated on 08/05/23--WNL  Sjogren's syndrome with other organ involvement (HCC) - Repeat ANA negative (03/27/20), Ro+, La-: She continues to have sicca symptoms.  Unchanged.  Raynaud's syndrome without gangrene -  She continues to experience intermittent symptoms of Raynaud's phenomenon.  Her fingertips are cool to touch today.  Slightly delayed capillary refill noted.  No signs of sclerodactyly noted.  Chronic left shoulder pain -presents today with ongoing pain in the left shoulder.  According to the patient her symptoms started 1 year ago with no identifiable trigger.  She has not had any injury or fall prior to the onset of symptoms.  She is under the care of orthopedics and has had 2 cortisone injections so far.  Her most recent cortisone injection was performed on 06/29/2023 which provided relief for about 2 weeks.  An MRI of the left shoulder was ordered but has been declined by insurance.  She has not yet tried physical therapy and has not had an x-ray of the left shoulder.  She has been using topical agents as well as taking over-the-counter products for pain relief.  She currently rates her pain an 8 out of 10.  She has a constant pain throughout the day as well as nocturnal and radiating pain at night.   X-rays of the left shoulder were obtained today for further evaluation.  She is currently scheduled for an MRI in January but has not yet received approval with her new insurance.  She will notify us of MRI results as well as the next steps in treatment recommended by orthopedics once completed.  Plan: XR Shoulder Left  Primary osteoarthritis of both hands: CMC, PIP, and DIP thickening consistent with osteoarthritis of both hands noted. No inflammation noted.  Spinal stenosis of cervical region: C-spine has limited ROM with lateral rotation.    History of scoliosis: Thoracic kyphosis noted.   Age-related osteoporosis without current pathological fracture: DEXA updated on 09/15/2021: Left radius BMD 0.537 with T score -2.6. she cannot take oral bisphosphonates due to Barrett's esophagus.  Patient is not interested in any treatment at this point.    Chronic TMJ pain: Not currently symptomatic.   Other  medical conditions are  listed as follows:   History of hyperlipidemia  History of gastroesophageal reflux (GERD)  History of diverticulitis  History of hypothyroidism  COVID-19 virus infection    Orders: Orders Placed This Encounter  Procedures   XR Shoulder Left   No orders of the defined types were placed in this encounter.    Follow-Up Instructions: Return in about 5 months (around 01/07/2024) for Rheumatoid arthritis.   Gearldine Bienenstock, PA-C  Note - This record has been created using Dragon software.  Chart creation errors have been sought, but may not always  have been located. Such creation errors do not reflect on  the standard of medical care.

## 2023-07-27 ENCOUNTER — Ambulatory Visit (HOSPITAL_COMMUNITY)
Admission: RE | Admit: 2023-07-27 | Discharge: 2023-07-27 | Disposition: A | Discharge status: Home / Self Care | Payer: Medicare HMO | Source: Ambulatory Visit | Attending: Gastroenterology | Admitting: Gastroenterology

## 2023-07-27 DIAGNOSIS — K219 Gastro-esophageal reflux disease without esophagitis: Secondary | ICD-10-CM | POA: Diagnosis present

## 2023-07-27 DIAGNOSIS — R1310 Dysphagia, unspecified: Secondary | ICD-10-CM | POA: Diagnosis present

## 2023-07-30 ENCOUNTER — Telehealth: Payer: Self-pay

## 2023-07-30 NOTE — Telephone Encounter (Signed)
Left message on machine to call back  

## 2023-07-30 NOTE — Telephone Encounter (Signed)
The patient has been notified of this information and all questions answered.

## 2023-07-30 NOTE — Telephone Encounter (Signed)
-----   Message from [place] T. Russella Dar sent at 07/30/2023 11:24 AM EST ----- Regarding: FW: Moderate esophageal dysmotility which is likely cause of her dysphagia  See 12/10 office note for plans ----- Message ----- From: Interface, Rad Results In Sent: 07/27/2023  11:00 AM EST To: Meryl Dare, MD

## 2023-08-05 ENCOUNTER — Other Ambulatory Visit: Payer: Self-pay | Admitting: *Deleted

## 2023-08-05 DIAGNOSIS — Z79899 Other long term (current) drug therapy: Secondary | ICD-10-CM

## 2023-08-06 LAB — CBC WITH DIFFERENTIAL/PLATELET
Absolute Lymphocytes: 1446 {cells}/uL (ref 850–3900)
Absolute Monocytes: 419 {cells}/uL (ref 200–950)
Basophils Absolute: 30 {cells}/uL (ref 0–200)
Basophils Relative: 0.5 %
Eosinophils Absolute: 59 {cells}/uL (ref 15–500)
Eosinophils Relative: 1 %
HCT: 40.6 % (ref 35.0–45.0)
Hemoglobin: 13.5 g/dL (ref 11.7–15.5)
MCH: 31.9 pg (ref 27.0–33.0)
MCHC: 33.3 g/dL (ref 32.0–36.0)
MCV: 96 fL (ref 80.0–100.0)
MPV: 10.8 fL (ref 7.5–12.5)
Monocytes Relative: 7.1 %
Neutro Abs: 3947 {cells}/uL (ref 1500–7800)
Neutrophils Relative %: 66.9 %
Platelets: 281 10*3/uL (ref 140–400)
RBC: 4.23 10*6/uL (ref 3.80–5.10)
RDW: 11.7 % (ref 11.0–15.0)
Total Lymphocyte: 24.5 %
WBC: 5.9 10*3/uL (ref 3.8–10.8)

## 2023-08-06 LAB — COMPLETE METABOLIC PANEL WITH GFR
AG Ratio: 1.8 (calc) (ref 1.0–2.5)
ALT: 22 U/L (ref 6–29)
AST: 22 U/L (ref 10–35)
Albumin: 4.9 g/dL (ref 3.6–5.1)
Alkaline phosphatase (APISO): 81 U/L (ref 37–153)
BUN: 19 mg/dL (ref 7–25)
CO2: 26 mmol/L (ref 20–32)
Calcium: 9.7 mg/dL (ref 8.6–10.4)
Chloride: 100 mmol/L (ref 98–110)
Creat: 0.63 mg/dL (ref 0.60–1.00)
Globulin: 2.8 g/dL (ref 1.9–3.7)
Glucose, Bld: 73 mg/dL (ref 65–99)
Potassium: 4.3 mmol/L (ref 3.5–5.3)
Sodium: 136 mmol/L (ref 135–146)
Total Bilirubin: 0.3 mg/dL (ref 0.2–1.2)
Total Protein: 7.7 g/dL (ref 6.1–8.1)
eGFR: 91 mL/min/{1.73_m2} (ref >=60)

## 2023-08-06 NOTE — Progress Notes (Signed)
CBC and CMP are normal.

## 2023-08-09 ENCOUNTER — Encounter: Payer: Self-pay | Admitting: Physician Assistant

## 2023-08-09 ENCOUNTER — Ambulatory Visit: Payer: Medicare HMO | Attending: Physician Assistant | Admitting: Physician Assistant

## 2023-08-09 ENCOUNTER — Ambulatory Visit (INDEPENDENT_AMBULATORY_CARE_PROVIDER_SITE_OTHER): Payer: Medicare HMO

## 2023-08-09 VITALS — BP 111/71 | HR 76 | Resp 14 | Ht 61.0 in | Wt 118.0 lb

## 2023-08-09 DIAGNOSIS — M3509 Sicca syndrome with other organ involvement: Secondary | ICD-10-CM | POA: Diagnosis not present

## 2023-08-09 DIAGNOSIS — Z79899 Other long term (current) drug therapy: Secondary | ICD-10-CM

## 2023-08-09 DIAGNOSIS — M4802 Spinal stenosis, cervical region: Secondary | ICD-10-CM

## 2023-08-09 DIAGNOSIS — M19042 Primary osteoarthritis, left hand: Secondary | ICD-10-CM

## 2023-08-09 DIAGNOSIS — G8929 Other chronic pain: Secondary | ICD-10-CM

## 2023-08-09 DIAGNOSIS — M810 Age-related osteoporosis without current pathological fracture: Secondary | ICD-10-CM

## 2023-08-09 DIAGNOSIS — M0579 Rheumatoid arthritis with rheumatoid factor of multiple sites without organ or systems involvement: Secondary | ICD-10-CM | POA: Diagnosis not present

## 2023-08-09 DIAGNOSIS — M26629 Arthralgia of temporomandibular joint, unspecified side: Secondary | ICD-10-CM

## 2023-08-09 DIAGNOSIS — M19041 Primary osteoarthritis, right hand: Secondary | ICD-10-CM

## 2023-08-09 DIAGNOSIS — Z8719 Personal history of other diseases of the digestive system: Secondary | ICD-10-CM

## 2023-08-09 DIAGNOSIS — I7300 Raynaud's syndrome without gangrene: Secondary | ICD-10-CM | POA: Diagnosis not present

## 2023-08-09 DIAGNOSIS — Z8639 Personal history of other endocrine, nutritional and metabolic disease: Secondary | ICD-10-CM

## 2023-08-09 DIAGNOSIS — Z8739 Personal history of other diseases of the musculoskeletal system and connective tissue: Secondary | ICD-10-CM

## 2023-08-09 DIAGNOSIS — M25512 Pain in left shoulder: Secondary | ICD-10-CM

## 2023-08-09 DIAGNOSIS — U071 COVID-19: Secondary | ICD-10-CM

## 2023-08-24 ENCOUNTER — Telehealth: Payer: Self-pay | Admitting: Rheumatology

## 2023-08-24 NOTE — Telephone Encounter (Signed)
 I called patient, patient will bring MRI results from [facility] for her chart/review.

## 2023-08-24 NOTE — Telephone Encounter (Signed)
 Pt called asking if  Dr. Corliss Skains would like the results to her MRI that she had done. Pt would like a return call on her cell at [phone]

## 2023-08-26 NOTE — Telephone Encounter (Signed)
I called the patient to discuss MRI results from 08/18/2023 of the left shoulder: Moderate to severe supraspinatus tendinosis with high-grade articular sided tearing at and medial to the footprint measuring 2.1 x 1.7 cm which involves greater than 75% of the tendon thickness.  Mild to moderate AC joint arthrosis was also noted.  Global labral degeneration with extensive multi quadrant degenerative tearing noted.  Mild intra-articular tendinosis of the proximal long biceps noted.  Mild to moderate glenohumeral chondral thinning with small inferior humeral head and marginal glenoid osteophytes noted.  Moderate joint effusion with synovitis and scattered intra-articular debris in the axillary pouch.  Patient has upcoming appointment scheduled with Dr. Duwayne Heck on 09/21/2023 at [facility] to discuss the next steps in treatment.  Patient remains in a considerable amount of pain and is having difficulty resting at night.  She was given a prescription for tramadol by Dr. Aundria Rud which she took at night but developed increased dizziness and does not plan on continuing.  Patient was advised to call Dr.'s Aundria Rud office to see if she can be placed on a cancellation list for sooner office visit due to severity of pain she is experiencing.  Patient will notify us with the next steps in treatment.    Sherron Ales, PA-C

## 2023-09-01 ENCOUNTER — Other Ambulatory Visit: Payer: Self-pay | Admitting: Orthopedic Surgery

## 2023-09-01 DIAGNOSIS — M25512 Pain in left shoulder: Secondary | ICD-10-CM

## 2023-09-03 ENCOUNTER — Ambulatory Visit
Admission: RE | Admit: 2023-09-03 | Discharge: 2023-09-03 | Disposition: A | Discharge status: Home / Self Care | Payer: Medicare Other | Source: Ambulatory Visit | Attending: Orthopedic Surgery | Admitting: Orthopedic Surgery

## 2023-09-03 DIAGNOSIS — M25512 Pain in left shoulder: Secondary | ICD-10-CM

## 2023-09-07 ENCOUNTER — Telehealth: Payer: Self-pay

## 2023-09-07 ENCOUNTER — Telehealth: Payer: Self-pay | Admitting: *Deleted

## 2023-09-07 NOTE — Telephone Encounter (Signed)
   Name: Linda Berg  DOB: 05/29/1946  MRN: [id number]  Primary Cardiologist: Elder Negus, MD   Preoperative team, please contact this patient and set up a phone call appointment for further preoperative risk assessment. Please obtain consent and complete medication review. Thank you for your help.  I confirm that guidance regarding antiplatelet and oral anticoagulation therapy has been completed and, if necessary, noted below. Pt can hold ASA x 7 days  I also confirmed the patient resides in the state of [place]. As per [facility] Medical Board telemedicine laws, the patient must reside in the state in which the provider is licensed. [place]  [place], [place] 09/07/2023, 4:30 PM [facility]

## 2023-09-07 NOTE — Telephone Encounter (Signed)
Pt has been scheduled tele preop appt 09/17/23, surgeon is waiting for clearance before scheduling. Med rec and consent are done.

## 2023-09-07 NOTE — Telephone Encounter (Signed)
Pt has been scheduled tele preop appt 09/17/23, surgeon is waiting for clearance before scheduling. Med rec and consent are done.    Patient Consent for Virtual Visit        Linda Berg has provided verbal consent on 09/07/2023 for a virtual visit (video or telephone).   CONSENT FOR VIRTUAL VISIT FOR:  Linda Berg  By participating in this virtual visit I agree to the following:  I hereby voluntarily request, consent and authorize [facility] and its employed or contracted physicians, physician assistants, nurse practitioners or other licensed health care professionals (the Practitioner), to provide me with telemedicine health care services (the "Services") as deemed necessary by the treating Practitioner. I acknowledge and consent to receive the Services by the Practitioner via telemedicine. I understand that the telemedicine visit will involve communicating with the Practitioner through live audiovisual communication technology and the disclosure of certain medical information by electronic transmission. I acknowledge that I have been given the opportunity to request an in-person assessment or other available alternative prior to the telemedicine visit and am voluntarily participating in the telemedicine visit.  I understand that I have the right to withhold or withdraw my consent to the use of telemedicine in the course of my care at any time, without affecting my right to future care or treatment, and that the Practitioner or I may terminate the telemedicine visit at any time. I understand that I have the right to inspect all information obtained and/or recorded in the course of the telemedicine visit and may receive copies of available information for a reasonable fee.  I understand that some of the potential risks of receiving the Services via telemedicine include:  Delay or interruption in medical evaluation due to technological equipment failure or  disruption; Information transmitted may not be sufficient (e.g. poor resolution of images) to allow for appropriate medical decision making by the Practitioner; and/or  In rare instances, security protocols could fail, causing a breach of personal health information.  Furthermore, I acknowledge that it is my responsibility to provide information about my medical history, conditions and care that is complete and accurate to the best of my ability. I acknowledge that Practitioner's advice, recommendations, and/or decision may be based on factors not within their control, such as incomplete or inaccurate data provided by me or distortions of diagnostic images or specimens that may result from electronic transmissions. I understand that the practice of medicine is not an exact science and that Practitioner makes no warranties or guarantees regarding treatment outcomes. I acknowledge that a copy of this consent can be made available to me via my patient portal [facility] MyChart), or I can request a printed copy by calling the office of [facility].    I understand that my insurance will be billed for this visit.   I have read or had this consent read to me. I understand the contents of this consent, which adequately explains the benefits and risks of the Services being provided via telemedicine.  I have been provided ample opportunity to ask questions regarding this consent and the Services and have had my questions answered to my satisfaction. I give my informed consent for the services to be provided through the use of telemedicine in my medical care

## 2023-09-07 NOTE — Telephone Encounter (Signed)
   Pre-operative Risk Assessment    Patient Name: Linda Berg  DOB: Dec 03, 1945 MRN: [id number]   Date of last office visit: 05/25/23 Date of next office visit: Not scheduled   Request for Surgical Clearance    Procedure:   Left shoulder reverse total shoulder  Date of Surgery:  Clearance TBD                                Surgeon:  Dr. Duwayne Heck Surgeon's Group or Practice Name:  [facility] Phone number:  [phone] Fax number:  [phone]   Type of Clearance Requested:   - Medical    Type of Anesthesia:   Choice   Additional requests/questions:    Garrel Ridgel   09/07/2023, 8:40 AM

## 2023-09-08 ENCOUNTER — Ambulatory Visit: Payer: Medicare Other | Admitting: Physician Assistant

## 2023-09-08 ENCOUNTER — Encounter: Payer: Self-pay | Admitting: Physician Assistant

## 2023-09-08 VITALS — BP 112/68 | HR 84 | Ht 60.25 in | Wt 119.2 lb

## 2023-09-08 DIAGNOSIS — K219 Gastro-esophageal reflux disease without esophagitis: Secondary | ICD-10-CM | POA: Diagnosis not present

## 2023-09-08 DIAGNOSIS — K5909 Other constipation: Secondary | ICD-10-CM

## 2023-09-08 DIAGNOSIS — R1310 Dysphagia, unspecified: Secondary | ICD-10-CM

## 2023-09-08 DIAGNOSIS — K5900 Constipation, unspecified: Secondary | ICD-10-CM

## 2023-09-08 NOTE — Progress Notes (Addendum)
Chief Complaint: Follow up GERD and Dysphagia and constipation  HPI:    Linda Berg is a 78 year old female with a past medical history as listed below including Barrett's esophagus, GERD and rheumatoid arthritis, previously known to Dr. Russella Dar, who presents to clinic today for follow-up of reflux and dysphagia and constipation.    07/20/2023 patient seen in clinic by Dr. Russella Dar and at that time discussed reflux, dysphagia and focal Barrett's esophagus without dysplasia as well as chronic constipation.  Noted that a colonoscopy previously been complicated by rectosigmoid colon perforation in 2019 and treated with clips.  Patient continued on Nexium 20 mg twice daily, discussed that if this is ineffective then could increase to 40 twice daily, recommended a barium esophagram and consider EGD.  Told to use MiraLAX and Metamucil daily.  Patient referred to Dr. Doy Hutching.    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    08/05/2023 normal CBC and CMP.    Today, the patient presents to clinic and tells me as far as her reflux and swallowing issues she is doing fairly well continuing on Esomeprazole 20 mg twice a day.  Does tell me very occasionally she feels some pain on the right side of her throat but feels like it has to do with what she swallowed and maybe it "scratches it in there".  Today she is really concerned about her slow bowel movements.  She is having only balls and cannot remember the last good bowel movement that she had over the past couple of weeks.  She does continue to pass gas.  Currently using Metamucil and 1 dose of MiraLAX at night in addition to a high-fiber diet and at least 40 ounces of water but tells me that things just "are not moving".    Denies fever, chills, nausea, vomiting or abdominal pain or weight loss.  Past Medical History:  Diagnosis Date    Barrett esophagus    Diverticulosis    Dyslipidemia    Facial numbness    GERD (gastroesophageal reflux disease)    Hearing loss    Hiatal hernia    Hypothyroidism    Rheumatoid arteritis (HCC)     Past Surgical History:  Procedure Laterality Date   CATARACT EXTRACTION, BILATERAL     04/13/2019, 04/27/2019   CHEST TUBE INSERTION  08/17/2017   COLONOSCOPY  08/17/2017   HAMMER TOE SURGERY Left    INTRAUTERINE DEVICE INSERTION     IUD REMOVAL     KNEE ARTHROPLASTY     KNEE ARTHROSCOPY  12/25/11   left   LEFT OOPHORECTOMY  03/13/98   MANDIBLE SURGERY  9/93   pallete expansion   TONSILLECTOMY     TUBAL LIGATION      Current Outpatient Medications  Medication Sig Dispense Refill   AMBULATORY NON FORMULARY MEDICATION ALLERGY SHOTS Once a week     aspirin 81 MG tablet Take 81 mg by mouth daily.      Biotin 5000 MCG TABS Take 1 tablet by mouth 1 day or 1 dose.      Calcium Citrate-Vitamin D (CALCIUM CITRATE + D PO) Take 400 mg by mouth 2 (two) times daily.     cholecalciferol (VITAMIN D) 1000 units tablet Take 1,000 Units by mouth daily.     Cyanocobalamin (VITAMIN B 12 PO) Take 1 tablet by mouth daily.     cyclobenzaprine (FLEXERIL) 5 MG tablet Take  5 mg by mouth as needed for muscle spasms. For TMJ at night     cycloSPORINE (RESTASIS) 0.05 % ophthalmic emulsion Place 1 drop into both eyes 2 (two) times daily.      EPINEPHrine 0.3 mg/0.3 mL IJ SOAJ injection epinephrine 0.3 mg/0.3 mL injection, auto-injector (Patient not taking: Reported on 08/09/2023)     esomeprazole (NEXIUM) 20 MG capsule Take 20 mg by mouth 2 (two) times daily before a meal.     fexofenadine (ALLEGRA) 180 MG tablet Take 180 mg by mouth daily.     fluticasone (FLONASE) 50 MCG/ACT nasal spray Place into both nostrils daily.     hydroxychloroquine (PLAQUENIL) 200 MG tablet Take 1 tablet (200 mg total) by mouth daily. 90 tablet 0   Krill Oil 300 MG CAPS Take 1 capsule by mouth daily.     lovastatin (MEVACOR) 20 MG  tablet Take 20 mg by mouth daily.     Magnesium 250 MG TABS Take 1 tablet by mouth daily.     Peppermint Oil (IBGARD) 90 MG CPCR Take by mouth. 2 capsules daily.     polyethylene glycol (MIRALAX / GLYCOLAX) 17 g packet Take by mouth as needed. 1/2 capful daily (Patient not taking: Reported on 08/09/2023)     Probiotic Product (PROBIOTIC DAILY PO) Take 1 tablet by mouth daily.      thyroid (ARMOUR) 60 MG tablet Take 60 mg by mouth daily before breakfast.     vitamin C (ASCORBIC ACID) 500 MG tablet Take 1,000 mg by mouth 2 (two) times daily.      Zinc 30 MG CAPS Take by mouth.     No current facility-administered medications for this visit.    Allergies as of 09/08/2023 - Review Complete 08/09/2023  Allergen Reaction Noted   Ciprofloxacin Anaphylaxis 08/17/2017   Terbinafine Rash 09/16/2013   Lamisil at [powders]  10/06/2019    Family History  Problem Relation Age of Onset   Heart disease Mother    Rheumatic fever Mother    Heart failure Mother    Stroke Father    Diabetes Father    Heart failure Father    Heart disease Maternal Aunt        aunt x 2   Irritable bowel syndrome Son    GER disease Son    Colon cancer Neg Hx     Social History   Socioeconomic History   Marital status: Married    Spouse name: Not on file   Number of children: 2   Years of education: 12   Highest education level: High school graduate  Occupational History   Occupation: retired    Associate Professor: RETIRED  Tobacco Use   Smoking status: Never    Passive exposure: Past   Smokeless tobacco: Never  Vaping Use   Vaping status: Never Used  Substance and Sexual Activity   Alcohol use: No    Alcohol/week: 0.0 standard drinks of alcohol   Drug use: No   Sexual activity: Not on file  Other Topics Concern   Not on file  Social History Narrative   Live with husband at home.   Left-handed.   1-2 cups per day.   Social Drivers of Health   Financial Resource Strain: Low Risk  (08/30/2021)   [facility]  [facility] prior to 10/10/2022., [facility] visits prior to 10/10/2022.   Overall Financial Resource Strain (CARDIA)    Difficulty of Paying Living Expenses: Not hard  at all  Food Insecurity: No Food Insecurity (09/04/2022)   [facility] to 10[facility]10/2022[facility] prior to 10/10/2022[facility]   Hunger Vital Sign    Worried About Running Out of Food in the Last Year: Never true    Ran Out of Food in the Last Year: Never true  Transportation Needs: No Transportation Needs (09/04/2022)   [facility] prior to 10/10/2022., [facility] visits prior to 10/10/2022.   PRAPARE - Administrator, Civil Service (Medical): No    Lack of Transportation (Non-Medical): No  Physical Activity: Insufficiently Active (08/30/2021)   [facility] from [facility] visits prior to 10/10/2022., [facility] visits prior to 10/10/2022.   Exercise Vital Sign    Days of Exercise per Week: 1 day    Minutes of Exercise per Session: 30 min  Stress: No Stress Concern Present (08/30/2021)   [facility] prior to 10/10/2022., [facility] visits prior to 10/10/2022.   [employer] of Occupational Health - Occupational Stress Questionnaire    Feeling of Stress : Not at all  Social Connections: Socially Integrated (08/30/2021)   Received from [facility] visits prior to 10/10/2022., [facility] visits prior to 10/10/2022.   Social Advertising account executive [NHANES]    Frequency of Communication with Friends and Family: More than three times a week    Frequency of Social Gatherings with Friends and Family: Once a week    Attends Religious  Services: More than 4 times per year    Active Member of [employer] or Organizations: Yes    Attends Banker Meetings: 1 to 4 times per year    Marital Status: Married  Catering manager Violence: Not At Risk (09/04/2022)   [facility] prior to 10/10/2022., [facility] visits prior to 10/10/2022.   Humiliation, Afraid, Rape, and Kick questionnaire    Fear of Current or Ex-Partner: No    Emotionally Abused: No    Physically Abused: No    Sexually Abused: No    Review of Systems:    Constitutional: No weight loss, fever or chills Cardiovascular: No chest pain Respiratory: No SOB  Gastrointestinal: See HPI and otherwise negative   Physical Exam:  Vital signs: BP 112/68   Pulse 84   Ht 5' 0.25" (1.53 m) Comment: measured in office today  Wt 119 lb 4 oz (54.1 kg)   BMI 23.10 kg/m    Constitutional:   Pleasant elderly Caucasian female appears to be in NAD, Well developed, Well nourished, alert and cooperative Respiratory: Respirations even and unlabored. Lungs clear to auscultation bilaterally.   No wheezes, crackles, or rhonchi.  Cardiovascular: Normal S1, S2. No MRG. Regular rate and rhythm. No peripheral edema, cyanosis or pallor.  Gastrointestinal:  Soft, nondistended, nontender. No rebound or guarding. Decreased BS all four quadrants. No appreciable masses or hepatomegaly. Rectal:  Not performed.  Psychiatric: Oriented to person, place and time. Demonstrates good judgement and reason without abnormal affect or behaviors.  RELEVANT LABS AND IMAGING: CBC    Component Value Date/Time   WBC 5.9 08/05/2023 1455   RBC 4.23 08/05/2023 1455   HGB 13.5 08/05/2023 1455   HGB 12.5 12/07/2019  1305   HCT 40.6 08/05/2023 1455   HCT 38.3 12/07/2019 1305   PLT 281 08/05/2023 1455   PLT 211 12/07/2019 1305   MCV 96.0 08/05/2023 1455   MCV 96 12/07/2019 1305   MCH 31.9 08/05/2023 1455   MCHC 33.3 08/05/2023 1455   RDW  11.7 08/05/2023 1455   RDW 11.5 (L) 12/07/2019 1305   LYMPHSABS 1,163 03/08/2023 1428   LYMPHSABS 1.4 12/07/2019 1305   MONOABS 0.2 08/18/2017 0325   EOSABS 59 08/05/2023 1455   EOSABS 0.2 12/07/2019 1305   BASOSABS 30 08/05/2023 1455   BASOSABS 0.0 12/07/2019 1305    CMP     Component Value Date/Time   NA 136 08/05/2023 1455   NA 140 12/07/2019 1305   K 4.3 08/05/2023 1455   CL 100 08/05/2023 1455   CO2 26 08/05/2023 1455   GLUCOSE 73 08/05/2023 1455   BUN 19 08/05/2023 1455   BUN 24 12/07/2019 1305   CREATININE 0.63 08/05/2023 1455   CALCIUM 9.7 08/05/2023 1455   PROT 7.7 08/05/2023 1455   PROT 6.6 12/07/2019 1305   ALBUMIN 4.4 12/07/2019 1305   AST 22 08/05/2023 1455   ALT 22 08/05/2023 1455   ALKPHOS 84 12/07/2019 1305   BILITOT 0.3 08/05/2023 1455   BILITOT 0.2 12/07/2019 1305   GFRNONAA 86 02/04/2021 1459   GFRAA 100 02/04/2021 1459    Assessment: 1.  Chronic constipation: History of perforation during colonoscopy in 2019 due to tortuous colon, chronic issues with constipation only minimally better with MiraLAX and Fiber daily; likely slow transit +/- tortuous colon 2.  GERD/dysphagia: Reviewed recent esophagram with presbyesophagus and reflux, symptoms are some better on Nexium 20 mg twice daily  Plan: 1.  Discussed reflux, explained that we could trial an increase in her Esomeprazole to 40 mg twice a day, but she is leery of doing this.  Currently doing okay with only minimal times of discomfort on the 20 mg.  She will call and let me know if she changes her mind.  Also discussed if it remains an issue for her we would need to do an EGD for further evaluation. 2.  Discussed constipation, likely due to slow transit and her tortuous colon.  For now continue Metamucil and water intake.  Will increase MiraLAX to twice daily after a MiraLAX bowel purge.  Patient given instructions today. 3.  In the future could discuss prescription medications, briefly mentioned some of  them today.  Apparently on Linzess in the past which really did not work well for her. 4.  Patient to follow in clinic with Dr. Doy Hutching, as this is who Dr. Russella Dar referred her to and she would like to stick with, in the next 3 to 4 months.  Hyacinth Meeker, PA-C [facility] 09/08/2023, 8:53 AM  Cc: Richmond Campbell., PA-C   I have reviewed the clinic note as outlined by Hyacinth Meeker, PA and agree with the assessment, plan and medical decision making. Linda Berg presents for further evaluation and management of longstanding GERD, dysphagia chronic constipation.  Thank you GERD has been managed with esomeprazole 20 mg orally daily.  Notes that this seems to be working fairly well.  Esophagram showed presbyesophagus with reflux but no stricture.  She has had symptoms of chronic constipation felt to be slow transit in nature.  Noted that she had a colonoscopy in 2019 complicated by rectosigmoid perforation.  She is amenable to conservative management with MiraLAX and Metamucil.  Linzess was not beneficial  in the past.  Other options could include Trulance or Motegrity.  Maren Beach, MD

## 2023-09-08 NOTE — Patient Instructions (Signed)
Your provider recommends that you complete a bowel purge (to clean out your bowels).   You will need to purchase the following over the counter: 238 gram bottle of Miralax (polyethylene glycol)  Dulcolax 5 mg tablets (four tablets) Optional: 32 ounce bottle of Gatorade (Gatorade Zero for diabetic patients)  Mix 6-8 capfuls of Miralax with 32 ounces of desired liquid (water/juice/Gatorade) and place in the refrigerator. Take 4 dulcolax tablets with water. Wait 1 hour. You should then drink 8 ounces of Miralax/liquid solution every 15 minutes over the next 1-3 hours until the solution is gone. You should expect results within 1 to 6 hours after completing the bowel purge.   Please increase Miralax to twice daily   Continue your esomeprazole.   _______________________________________________________  If your blood pressure at your visit was 140/90 or greater, please contact your primary care physician to follow up on this.  _______________________________________________________  If you are age 15 or older, your body mass index should be between 23-30. Your Body mass index is 23.1 kg/m. If this is out of the aforementioned range listed, please consider follow up with your Primary Care Provider.  If you are age 29 or younger, your body mass index should be between 19-25. Your Body mass index is 23.1 kg/m. If this is out of the aformentioned range listed, please consider follow up with your Primary Care Provider.   ________________________________________________________  The [facility] providers would like to encourage you to use [facility] to communicate with providers for non-urgent requests or questions.  Due to long hold times on the telephone, sending your provider a message by [facility] may be a faster and more efficient way to get a response.  Please allow 48 business hours for a response.  Please remember that this is for non-urgent requests.   _______________________________________________________ It was a pleasure to see you today!  Thank you for trusting me with your gastrointestinal care!

## 2023-09-17 ENCOUNTER — Ambulatory Visit: Payer: Medicare Other | Attending: Cardiology

## 2023-09-17 DIAGNOSIS — Z01810 Encounter for preprocedural cardiovascular examination: Secondary | ICD-10-CM | POA: Diagnosis not present

## 2023-09-17 NOTE — Progress Notes (Signed)
 Virtual Visit via Telephone Note   Because of Linda Berg's co-morbid illnesses, she is at least at moderate risk for complications without adequate follow up.  This format is felt to be most appropriate for this patient at this time.  The patient did not have access to video technology/had technical difficulties with video requiring transitioning to audio format only (telephone).  All issues noted in this document were discussed and addressed.  No physical exam could be performed with this format.  Please refer to the patient's chart for her consent to telehealth for [facility].  Evaluation Performed:  Preoperative cardiovascular risk assessment _____________   Date:  09/17/2023   Patient ID:  Linda Berg, DOB 01-02-1946, MRN [id number] Patient Location:  Home Provider location:   Office  Primary Care Provider:  Debrah Josette ORN., PA-C Primary Cardiologist:  Newman JINNY Lawrence, MD  Chief Complaint / Patient Profile  78 y.o. y/o female with a h/o hypothyroidism, rheumatoid arthritis, Sjorgen's, possible history of TIA and palpitations who is pending left shoulder reverse total shoulder with Dr. Selinda Gosling and presents today for telephonic preoperative cardiovascular risk assessment. History of Present Illness    Linda Berg is a 78 y.o. female who presents via audio/video conferencing for a telehealth visit today.  Pt was last seen in cardiology clinic on 05/25/2023 by Dr. Lawrence.  At that time Linda Berg was doing well.  The patient is now pending procedure as outlined above. Since her last visit, she has remained stable from a cardiac standpoint, she notes occasional palpitations that are overall well-controlled. Today she denies chest pain, shortness of breath, lower extremity edema, fatigue, melena, hematuria, hemoptysis, diaphoresis, weakness, presyncope, syncope, orthopnea, and PND.   Past  Medical History    Past Medical History:  Diagnosis Date   Barrett esophagus    Diverticulosis    Dyslipidemia    Facial numbness    GERD (gastroesophageal reflux disease)    Hearing loss    Hiatal hernia    Hypothyroidism    Rheumatoid arteritis (HCC)    Rotator cuff tear, left    Past Surgical History:  Procedure Laterality Date   CATARACT EXTRACTION, BILATERAL     04/13/2019, 04/27/2019   CHEST TUBE INSERTION  08/17/2017   COLONOSCOPY  08/17/2017   HAMMER TOE SURGERY Left    INTRAUTERINE DEVICE INSERTION     IUD REMOVAL     KNEE ARTHROPLASTY     KNEE ARTHROSCOPY  12/25/11   left   LEFT OOPHORECTOMY  03/13/98   MANDIBLE SURGERY  9/93   pallete expansion   TONSILLECTOMY     TUBAL LIGATION     Allergies Allergies  Allergen Reactions   Ciprofloxacin Anaphylaxis    Facial numbness   Prednisone Other (See Comments)    Stomach spasms   Terbinafine Rash   Lamisil At [Powders]    Home Medications    Prior to Admission medications   Medication Sig Start Date End Date Taking? Authorizing Provider  AMBULATORY NON FORMULARY MEDICATION ALLERGY SHOTS Once a week    [provider]  aspirin  81 MG tablet Take 81 mg by mouth daily.     [provider]  Biotin 5000 MCG TABS Take 1 tablet by mouth 1 day or 1 dose.     [provider]  Calcium Citrate-Vitamin D  (CALCIUM CITRATE + D PO) Take 400 mg by mouth 2 (two) times daily.    [provider]  cholecalciferol (VITAMIN D ) 1000 units tablet Take 1,000 Units by mouth daily.    [provider]  Cyanocobalamin (VITAMIN B 12 PO) Take 1 tablet by mouth daily.    [provider]  cyclobenzaprine  (FLEXERIL ) 5 MG tablet Take 5 mg by mouth as needed for muscle spasms. For TMJ at night 09/23/22   [provider]  cycloSPORINE (RESTASIS) 0.05 % ophthalmic emulsion Place 1 drop into both eyes 2 (two) times daily.     [provider]  EPINEPHrine  0.3 mg/0.3 mL IJ SOAJ  injection  04/29/17   [provider]  esomeprazole  (NEXIUM ) 20 MG capsule Take 20 mg by mouth 2 (two) times daily before a meal.    [provider]  fexofenadine (ALLEGRA) 180 MG tablet Take 180 mg by mouth daily.    [provider]  fluticasone (FLONASE) 50 MCG/ACT nasal spray Place into both nostrils daily.    [provider]  hydroxychloroquine  (PLAQUENIL ) 200 MG tablet Take 1 tablet (200 mg total) by mouth daily. 03/07/23   Dolphus Reiter, MD  Krill Oil 300 MG CAPS Take 1 capsule by mouth daily.    [provider]  lovastatin (MEVACOR) 20 MG tablet Take 20 mg by mouth daily.    [provider]  Magnesium  250 MG TABS Take 1 tablet by mouth daily.    [provider]  Peppermint Oil (IBGARD) 90 MG CPCR Take by mouth. 2 capsules daily.    [provider]  polyethylene glycol (MIRALAX  / GLYCOLAX ) 17 g packet Take by mouth as needed. 1/2 capful daily    [provider]  Probiotic Product (PROBIOTIC DAILY PO) Take 1 tablet by mouth daily.     [provider]  thyroid  (ARMOUR) 60 MG tablet Take 60 mg by mouth daily before breakfast.    [provider]  vitamin C (ASCORBIC ACID) 500 MG tablet Take 1,000 mg by mouth 2 (two) times daily.     [provider]  Zinc 30 MG CAPS Take by mouth.    [provider]    Physical Exam    Vital Signs:  Linda Berg does not have vital signs available for review today. Given telephonic nature of communication, physical exam is limited. AAOx3. NAD. Normal affect.  Speech and respirations are unlabored. Accessory Clinical Findings   None Assessment & Plan    1.  Preoperative Cardiovascular Risk Assessment:  left shoulder reverse total shoulder with Dr. Selinda Gosling   Linda Berg's perioperative risk of a major cardiac event is 0.9% according to the Revised Cardiac Risk Index (RCRI).  Therefore, she is at low risk for  perioperative complications.   Her functional capacity is good at 6.61 METs according to the Duke Activity Status Index (DASI). Recommendations: According to ACC/AHA guidelines, no further cardiovascular testing needed.  The patient may proceed to surgery at acceptable risk.   Antiplatelet and/or Anticoagulation Recommendations: Aspirin  can be held for 7 days prior to her surgery.  Please resume Aspirin  post operatively when it is felt to be safe from a bleeding standpoint.   The patient was advised that if she develops new symptoms prior to surgery to contact our office to arrange for a follow-up visit, and she verbalized understanding.  A copy of this note will be routed to requesting surgeon.  Time:   Today, I have spent 12 minutes with the patient with telehealth technology discussing medical history, symptoms, and management plan.    Dyron Kawano  D Devora, NP  09/17/2023, 3:14 PM

## 2023-09-25 NOTE — Progress Notes (Signed)
 Anesthesia Review:  PCP: Cardiologist : Kriste Basque West,NP preop eval on 09/17/23  Chest x-ray : EKG : Echo : 09/28/2022-   Monitor- 10/26/22  Stress test: 2015  Cardiac Cath :  Activity level:  Sleep Study/ CPAP : Fasting Blood Sugar :      / Checks Blood Sugar -- times a day:   Blood Thinner/ Instructions /Last Dose: ASA / Instructions/ Last Dose :    81 mg aspiirn

## 2023-09-25 NOTE — Patient Instructions (Signed)
 SURGICAL WAITING ROOM VISITATION  Patients having surgery or a procedure may have no more than 2 support people in the waiting area - these visitors may rotate.    Children under the age of 53 must have an adult with them who is not the patient.  Due to an increase in RSV and influenza rates and associated hospitalizations, children ages 67 and under may not visit patients in [facility] hospitals.  Visitors with respiratory illnesses are discouraged from visiting and should remain at home.  If the patient needs to stay at the hospital during part of their recovery, the visitor guidelines for inpatient rooms apply. Pre-op nurse will coordinate an appropriate time for 1 support person to accompany patient in pre-op.  This support person may not rotate.    Please refer to the [facility] website for the visitor guidelines for Inpatients (after your surgery is over and you are in a regular room).       Your procedure is scheduled on:  10/08/2023    Report to [facility] Main Entrance    Report to admitting at  0700 AM   Call this number if you have problems the morning of surgery [phone]   Do not eat food :After Midnight.   After Midnight you may have the following liquids until __ 0630____ AM DAY OF SURGERY  Water Non-Citrus Juices (without pulp, NO RED-Apple, White grape, White cranberry) Black Coffee (NO MILK/CREAM OR CREAMERS, sugar ok)  Clear Tea (NO MILK/CREAM OR CREAMERS, sugar ok) regular and decaf                             Plain Jell-O (NO RED)                                           Fruit ices (not with fruit pulp, NO RED)                                     Popsicles (NO RED)                                                               Sports drinks like Gatorade (NO RED)       The day of surgery:  Drink ONE (1) Pre-Surgery Clear Ensure or G2 at 0630 AM  ( have completed by) the morning of surgery. Drink in one sitting. Do not sip.  This drink was  given to you during your hospital  pre-op appointment visit. Nothing else to drink after completing the  Pre-Surgery Clear Ensure or G2.          If you have questions, please contact your surgeon's office.       Oral Hygiene is also important to reduce your risk of infection.                                    Remember - BRUSH YOUR TEETH THE MORNING OF SURGERY WITH YOUR  REGULAR TOOTHPASTE  DENTURES WILL BE REMOVED PRIOR TO SURGERY PLEASE DO NOT APPLY "Poly grip" OR ADHESIVES!!!   Do NOT smoke after Midnight   Stop all vitamins and herbal supplements 7 days before surgery.   Take these medicines the morning of surgery with A SIP OF WATER:  eye drops as usual , nexium, allegra, flonase, thyroid armour   DO NOT TAKE ANY ORAL DIABETIC MEDICATIONS DAY OF YOUR SURGERY  Bring CPAP mask and tubing day of surgery.                              You may not have any metal on your body including hair pins, jewelry, and body piercing             Do not wear make-up, lotions, powders, perfumes/cologne, or deodorant  Do not wear nail polish including gel and S&S, artificial/acrylic nails, or any other type of covering on natural nails including finger and toenails. If you have artificial nails, gel coating, etc. that needs to be removed by a nail salon please have this removed prior to surgery or surgery may need to be canceled/ delayed if the surgeon/ anesthesia feels like they are unable to be safely monitored.   Do not shave  48 hours prior to surgery.               Men may shave face and neck.   Do not bring valuables to the hospital. [facility] IS NOT             RESPONSIBLE   FOR VALUABLES.   Contacts, glasses, dentures or bridgework may not be worn into surgery.   Bring small overnight bag day of surgery.   DO NOT BRING YOUR HOME MEDICATIONS TO THE HOSPITAL. PHARMACY WILL DISPENSE MEDICATIONS LISTED ON YOUR MEDICATION LIST TO YOU DURING YOUR ADMISSION IN THE  HOSPITAL!    Patients discharged on the day of surgery will not be allowed to drive home.  Someone NEEDS to stay with you for the first 24 hours after anesthesia.   Special Instructions: Bring a copy of your healthcare power of attorney and living will documents the day of surgery if you haven't scanned them before.              Please read over the following fact sheets you were given: IF YOU HAVE QUESTIONS ABOUT YOUR PRE-OP INSTRUCTIONS PLEASE CALL [phone]   If you received a COVID test during your pre-op visit  it is requested that you wear a mask when out in public, stay away from anyone that may not be feeling well and notify your surgeon if you develop symptoms. If you test positive for Covid or have been in contact with anyone that has tested positive in the last 10 days please notify you surgeon.      Pre-operative 5 CHG Bath Instructions   You can play a key role in reducing the risk of infection after surgery. Your skin needs to be as free of germs as possible. You can reduce the number of germs on your skin by washing with CHG (chlorhexidine gluconate) soap before surgery. CHG is an antiseptic soap that kills germs and continues to kill germs even after washing.   DO NOT use if you have an allergy to chlorhexidine/CHG or antibacterial soaps. If your skin becomes reddened or irritated, stop using the CHG and notify one of our RNs at [phone].  Please shower with the CHG soap starting 4 days before surgery using the following schedule:     Please keep in mind the following:  DO NOT shave, including legs and underarms, starting the day of your first shower.   You may shave your face at any point before/day of surgery.  Place clean sheets on your bed the day you start using CHG soap. Use a clean washcloth (not used since being washed) for each shower. DO NOT sleep with pets once you start using the CHG.   CHG Shower Instructions:  If you choose to wash your hair and  private area, wash first with your normal shampoo/soap.  After you use shampoo/soap, rinse your hair and body thoroughly to remove shampoo/soap residue.  Turn the water OFF and apply about 3 tablespoons (45 ml) of CHG soap to a CLEAN washcloth.  Apply CHG soap ONLY FROM YOUR NECK DOWN TO YOUR TOES (washing for 3-5 minutes)  DO NOT use CHG soap on face, private areas, open wounds, or sores.  Pay special attention to the area where your surgery is being performed.  If you are having back surgery, having someone wash your back for you may be helpful. Wait 2 minutes after CHG soap is applied, then you may rinse off the CHG soap.  Pat dry with a clean towel  Put on clean clothes/pajamas   If you choose to wear lotion, please use ONLY the CHG-compatible lotions on the back of this paper.     Additional instructions for the day of surgery: DO NOT APPLY any lotions, deodorants, cologne, or perfumes.   Put on clean/comfortable clothes.  Brush your teeth.  Ask your nurse before applying any prescription medications to the skin.      CHG Compatible Lotions   Aveeno Moisturizing lotion  Cetaphil Moisturizing Cream  Cetaphil Moisturizing Lotion  Clairol Herbal Essence Moisturizing Lotion, Dry Skin  Clairol Herbal Essence Moisturizing Lotion, Extra Dry Skin  Clairol Herbal Essence Moisturizing Lotion, Normal Skin  Curel Age Defying Therapeutic Moisturizing Lotion with Alpha Hydroxy  Curel Extreme Care Body Lotion  Curel Soothing Hands Moisturizing Hand Lotion  Curel Therapeutic Moisturizing Cream, Fragrance-Free  Curel Therapeutic Moisturizing Lotion, Fragrance-Free  Curel Therapeutic Moisturizing Lotion, Original Formula  Eucerin Daily Replenishing Lotion  Eucerin Dry Skin Therapy Plus Alpha Hydroxy Crme  Eucerin Dry Skin Therapy Plus Alpha Hydroxy Lotion  Eucerin Original Crme  Eucerin Original Lotion  Eucerin Plus Crme Eucerin Plus Lotion  Eucerin TriLipid Replenishing Lotion  Keri  Anti-Bacterial Hand Lotion  Keri Deep Conditioning Original Lotion Dry Skin Formula Softly Scented  Keri Deep Conditioning Original Lotion, Fragrance Free Sensitive Skin Formula  Keri Lotion Fast Absorbing Fragrance Free Sensitive Skin Formula  Keri Lotion Fast Absorbing Softly Scented Dry Skin Formula  Keri Original Lotion  Keri Skin Renewal Lotion Keri Silky Smooth Lotion  Keri Silky Smooth Sensitive Skin Lotion  Nivea Body Creamy Conditioning Oil  Nivea Body Extra Enriched Lotion  Nivea Body Original Lotion  Nivea Body Sheer Moisturizing Lotion Nivea Crme  Nivea Skin Firming Lotion  NutraDerm 30 Skin Lotion  NutraDerm Skin Lotion  NutraDerm Therapeutic Skin Cream  NutraDerm Therapeutic Skin Lotion  ProShield Protective Hand Cream  Provon moisturizing lotion   [facility]- Preparing for Total Shoulder Arthroplasty    Before surgery, you can play an important role. Because skin is not sterile, your skin needs to be as free of germs as possible. You can reduce the number of germs on your skin  by using the following products. Benzoyl Peroxide Gel Reduces the number of germs present on the skin Applied twice a day to shoulder area starting two days before surgery    ==================================================================  Please follow these instructions carefully:  BENZOYL PEROXIDE 5% GEL  Please do not use if you have an allergy to benzoyl peroxide.   If your skin becomes reddened/irritated stop using the benzoyl peroxide.  Starting two days before surgery, apply as follows: Apply benzoyl peroxide in the morning and at night. Apply after taking a shower. If you are not taking a shower clean entire shoulder front, back, and side along with the armpit with a clean wet washcloth.  Place a quarter-sized dollop on your shoulder and rub in thoroughly, making sure to cover the front, back, and side of your shoulder, along with the armpit.   2 days before ____ AM   ____ PM               1 day before ____ AM   ____ PM                         Do this twice a day for two days.  (Last application is the night before surgery, AFTER using the CHG soap as described below).  Do NOT apply benzoyl peroxide gel on the day of surgery.

## 2023-09-27 ENCOUNTER — Encounter (HOSPITAL_COMMUNITY): Payer: Self-pay

## 2023-09-27 ENCOUNTER — Encounter (HOSPITAL_COMMUNITY)
Admission: RE | Admit: 2023-09-27 | Discharge: 2023-09-27 | Disposition: A | Discharge status: Home / Self Care | Payer: Medicare Other | Source: Ambulatory Visit | Attending: Orthopedic Surgery | Admitting: Orthopedic Surgery

## 2023-09-27 ENCOUNTER — Other Ambulatory Visit: Payer: Self-pay

## 2023-09-27 VITALS — BP 116/76 | HR 85 | Temp 98.0°F | Resp 16 | Ht 60.0 in | Wt 114.0 lb

## 2023-09-27 DIAGNOSIS — Z01818 Encounter for other preprocedural examination: Secondary | ICD-10-CM | POA: Diagnosis present

## 2023-09-27 LAB — CBC
HCT: 40.2 % (ref 36.0–46.0)
Hemoglobin: 13.1 g/dL (ref 12.0–15.0)
MCH: 32.3 pg (ref 26.0–34.0)
MCHC: 32.6 g/dL (ref 30.0–36.0)
MCV: 99 fL (ref 80.0–100.0)
Platelets: 244 10*3/uL (ref 150–400)
RBC: 4.06 MIL/uL (ref 3.87–5.11)
RDW: 12.2 % (ref 11.5–15.5)
WBC: 5.7 10*3/uL (ref 4.0–10.5)
nRBC: 0 % (ref 0.0–0.2)

## 2023-09-27 LAB — BASIC METABOLIC PANEL
Anion gap: 10 (ref 5–15)
BUN: 16 mg/dL (ref 8–23)
CO2: 26 mmol/L (ref 22–32)
Calcium: 9.1 mg/dL (ref 8.9–10.3)
Chloride: 100 mmol/L (ref 98–111)
Creatinine, Ser: 0.7 mg/dL (ref 0.44–1.00)
GFR, Estimated: >60 mL/min (ref >60)
Glucose, Bld: 97 mg/dL (ref 70–99)
Potassium: 4.5 mmol/L (ref 3.5–5.1)
Sodium: 136 mmol/L (ref 135–145)

## 2023-09-27 LAB — SURGICAL PCR SCREEN
MRSA, PCR: NEGATIVE
Staphylococcus aureus: NEGATIVE

## 2023-09-28 ENCOUNTER — Ambulatory Visit: Payer: Medicare HMO | Admitting: Pediatrics

## 2023-10-07 NOTE — Anesthesia Preprocedure Evaluation (Signed)
 Anesthesia Evaluation  Patient identified by MRN, date of birth, ID band Patient awake    Reviewed: Allergy & Precautions, NPO status , Patient's Chart, lab work & pertinent test results  History of Anesthesia Complications Negative for: history of anesthetic complications  Airway Mallampati: III  TM Distance: >3 FB Neck ROM: Full    Dental  (+) Dental Advisory Given   Pulmonary neg shortness of breath, neg sleep apnea, neg COPD, neg recent URI H/o traumatic pneumothorax   Pulmonary exam normal breath sounds clear to auscultation       Cardiovascular (-) hypertension(-) angina (-) Past MI, (-) Cardiac Stents and (-) CABG + dysrhythmias (paroxysmal atrial tachycardia) + Valvular Problems/Murmurs (mild MR)  Rhythm:Regular Rate:Normal  HLD  Echocardiogram 09/24/2022:  Normal LV systolic function with visual EF 60-65%. Left ventricle cavity  is normal in size. Normal left ventricular wall thickness. Normal global  wall motion. Indeterminate diastolic filling pattern. Calculated EF 68%.  Structurally normal mitral valve.  Mild (Grade I) mitral regurgitation.  Structurally normal tricuspid valve with trace regurgitation. No evidence  of pulmonary hypertension.     Neuro/Psych  Headaches, neg Seizures    GI/Hepatic Neg liver ROS, hiatal hernia,GERD (Barrett's esophagus)  Medicated,,Diverticulosis    Endo/Other  neg diabetesHypothyroidism    Renal/GU negative Renal ROS     Musculoskeletal  (+) Arthritis  (on Plaquenil), Osteoarthritis and Rheumatoid disorders,  Scoliosis    Abdominal   Peds  Hematology negative hematology ROS (+) Lab Results      Component                Value               Date                      WBC                      5.7                 09/27/2023                HGB                      13.1                09/27/2023                HCT                      40.2                09/27/2023                 MCV                      99.0                09/27/2023                PLT                      244                 09/27/2023              Anesthesia Other Findings   Reproductive/Obstetrics  Anesthesia Physical Anesthesia Plan  ASA: 3  Anesthesia Plan: General   Post-op Pain Management: Regional block* and Tylenol PO (pre-op)*   Induction: Intravenous  PONV Risk Score and Plan: 3 and Ondansetron, Dexamethasone and Treatment may vary due to age or medical condition  Airway Management Planned: Oral ETT  Additional Equipment:   Intra-op Plan:   Post-operative Plan: Extubation in OR  Informed Consent: I have reviewed the patients History and Physical, chart, labs and discussed the procedure including the risks, benefits and alternatives for the proposed anesthesia with the patient or authorized representative who has indicated his/her understanding and acceptance.     Dental advisory given  Plan Discussed with: CRNA and Anesthesiologist  Anesthesia Plan Comments: (Discussed potential risks of nerve blocks including, but not limited to, infection, bleeding, nerve damage, seizures, pneumothorax, respiratory depression, and potential failure of the block. Alternatives to nerve blocks discussed. All questions answered.  Risks of general anesthesia discussed including, but not limited to, sore throat, hoarse voice, chipped/damaged teeth, injury to vocal cords, nausea and vomiting, allergic reactions, lung infection, heart attack, stroke, and death. All questions answered. )       Anesthesia Quick Evaluation

## 2023-10-08 ENCOUNTER — Ambulatory Visit (HOSPITAL_COMMUNITY)
Admission: RE | Admit: 2023-10-08 | Discharge: 2023-10-08 | Disposition: A | Discharge status: Home / Self Care | Payer: Medicare Other | Source: Ambulatory Visit | Attending: Orthopedic Surgery | Admitting: Orthopedic Surgery

## 2023-10-08 ENCOUNTER — Ambulatory Visit (HOSPITAL_COMMUNITY): Payer: Medicare Other | Admitting: Physician Assistant

## 2023-10-08 ENCOUNTER — Encounter (HOSPITAL_COMMUNITY): Payer: Self-pay | Admitting: Orthopedic Surgery

## 2023-10-08 ENCOUNTER — Encounter (HOSPITAL_COMMUNITY)
Admission: RE | Disposition: A | Discharge status: Home / Self Care | Payer: Self-pay | Source: Ambulatory Visit | Attending: Orthopedic Surgery

## 2023-10-08 ENCOUNTER — Ambulatory Visit (HOSPITAL_BASED_OUTPATIENT_CLINIC_OR_DEPARTMENT_OTHER): Payer: Medicare Other | Admitting: Anesthesiology

## 2023-10-08 ENCOUNTER — Other Ambulatory Visit: Payer: Self-pay

## 2023-10-08 ENCOUNTER — Ambulatory Visit (HOSPITAL_COMMUNITY): Payer: Medicare Other

## 2023-10-08 DIAGNOSIS — Z01818 Encounter for other preprocedural examination: Secondary | ICD-10-CM

## 2023-10-08 DIAGNOSIS — M19012 Primary osteoarthritis, left shoulder: Secondary | ICD-10-CM | POA: Insufficient documentation

## 2023-10-08 DIAGNOSIS — Z79899 Other long term (current) drug therapy: Secondary | ICD-10-CM | POA: Diagnosis not present

## 2023-10-08 DIAGNOSIS — M12812 Other specific arthropathies, not elsewhere classified, left shoulder: Secondary | ICD-10-CM

## 2023-10-08 DIAGNOSIS — E785 Hyperlipidemia, unspecified: Secondary | ICD-10-CM

## 2023-10-08 DIAGNOSIS — M069 Rheumatoid arthritis, unspecified: Secondary | ICD-10-CM | POA: Diagnosis not present

## 2023-10-08 DIAGNOSIS — K2270 Barrett's esophagus without dysplasia: Secondary | ICD-10-CM | POA: Diagnosis not present

## 2023-10-08 DIAGNOSIS — E039 Hypothyroidism, unspecified: Secondary | ICD-10-CM | POA: Diagnosis not present

## 2023-10-08 DIAGNOSIS — K449 Diaphragmatic hernia without obstruction or gangrene: Secondary | ICD-10-CM | POA: Diagnosis not present

## 2023-10-08 DIAGNOSIS — K219 Gastro-esophageal reflux disease without esophagitis: Secondary | ICD-10-CM | POA: Diagnosis not present

## 2023-10-08 DIAGNOSIS — Z79890 Hormone replacement therapy (postmenopausal): Secondary | ICD-10-CM | POA: Diagnosis not present

## 2023-10-08 HISTORY — PX: REVERSE SHOULDER ARTHROPLASTY: SHX5054

## 2023-10-08 SURGERY — ARTHROPLASTY, SHOULDER, TOTAL, REVERSE
Anesthesia: General | Site: Shoulder | Laterality: Left

## 2023-10-08 MED ORDER — ROCURONIUM BROMIDE 100 MG/10ML IV SOLN
INTRAVENOUS | Status: DC | PRN
  Administered 2023-10-08: 50 mg via INTRAVENOUS

## 2023-10-08 MED ORDER — STERILE WATER FOR IRRIGATION IR SOLN
Status: DC | PRN
Start: 2023-10-08 — End: 2023-10-08
  Administered 2023-10-08: 1000 mL

## 2023-10-08 MED ORDER — TRANEXAMIC ACID-NACL 1000-0.7 MG/100ML-% IV SOLN
INTRAVENOUS | Status: DC | PRN
  Administered 2023-10-08: 1000 mg via INTRAVENOUS

## 2023-10-08 MED ORDER — ONDANSETRON 4 MG PO TBDP
ORAL_TABLET | ORAL | Status: AC
  Filled 2023-10-08: qty 1

## 2023-10-08 MED ORDER — ORAL CARE MOUTH RINSE: 15.0000 mL | Freq: Once | OROMUCOSAL | Status: AC

## 2023-10-08 MED ORDER — PHENYLEPHRINE HCL-NACL 20-0.9 MG/250ML-% IV SOLN
INTRAVENOUS | Status: DC | PRN
  Administered 2023-10-08: 25 ug/min via INTRAVENOUS

## 2023-10-08 MED ORDER — OXYCODONE HCL 5 MG PO TABS
ORAL_TABLET | ORAL | Status: AC
Start: 2023-10-08 — End: 2023-10-08
  Administered 2023-10-08: 5 mg via ORAL
  Filled 2023-10-08: qty 1

## 2023-10-08 MED ORDER — FENTANYL CITRATE PF 50 MCG/ML IJ SOSY
PREFILLED_SYRINGE | INTRAMUSCULAR | Status: AC
  Administered 2023-10-08: 25 ug via INTRAVENOUS
  Filled 2023-10-08: qty 1

## 2023-10-08 MED ORDER — LACTATED RINGERS IV SOLN: INTRAVENOUS | Status: DC

## 2023-10-08 MED ORDER — LIDOCAINE HCL (CARDIAC) PF 100 MG/5ML IV SOSY
PREFILLED_SYRINGE | INTRAVENOUS | Status: DC | PRN
  Administered 2023-10-08: 60 mg via INTRAVENOUS

## 2023-10-08 MED ORDER — VANCOMYCIN HCL 1000 MG IV SOLR
INTRAVENOUS | Status: AC
  Filled 2023-10-08: qty 20

## 2023-10-08 MED ORDER — CEFAZOLIN SODIUM-DEXTROSE 2-4 GM/100ML-% IV SOLN
2.0000 g | INTRAVENOUS | Status: AC
  Administered 2023-10-08: 2 g via INTRAVENOUS
  Filled 2023-10-08: qty 100

## 2023-10-08 MED ORDER — ONDANSETRON HCL 4 MG PO TABS: 4.0000 mg | ORAL_TABLET | Freq: Three times a day (TID) | ORAL | 0 refills | Status: DC | PRN

## 2023-10-08 MED ORDER — 0.9 % SODIUM CHLORIDE (POUR BTL) OPTIME
TOPICAL | Status: DC | PRN
  Administered 2023-10-08: 1000 mL

## 2023-10-08 MED ORDER — BUPIVACAINE LIPOSOME 1.3 % IJ SUSP
INTRAMUSCULAR | Status: DC | PRN
  Administered 2023-10-08: 10 mL via PERINEURAL

## 2023-10-08 MED ORDER — AMISULPRIDE (ANTIEMETIC) 5 MG/2ML IV SOLN: 10.0000 mg | Freq: Once | INTRAVENOUS | Status: DC | PRN

## 2023-10-08 MED ORDER — OXYCODONE HCL 5 MG PO TABS: 5.0000 mg | ORAL_TABLET | Freq: Once | ORAL | Status: AC | PRN

## 2023-10-08 MED ORDER — CHLORHEXIDINE GLUCONATE 0.12 % MT SOLN
15.0000 mL | Freq: Once | OROMUCOSAL | Status: AC
  Administered 2023-10-08: 15 mL via OROMUCOSAL

## 2023-10-08 MED ORDER — PROPOFOL 10 MG/ML IV BOLUS
INTRAVENOUS | Status: AC
  Filled 2023-10-08: qty 20

## 2023-10-08 MED ORDER — ONDANSETRON 4 MG PO TBDP
4.0000 mg | ORAL_TABLET | Freq: Once | ORAL | Status: AC
  Administered 2023-10-08: 4 mg via ORAL

## 2023-10-08 MED ORDER — FENTANYL CITRATE PF 50 MCG/ML IJ SOSY
PREFILLED_SYRINGE | INTRAMUSCULAR | Status: AC
Start: 2023-10-08 — End: 2023-10-08
  Administered 2023-10-08: 25 ug via INTRAVENOUS
  Filled 2023-10-08: qty 1

## 2023-10-08 MED ORDER — VANCOMYCIN HCL 1000 MG IV SOLR
INTRAVENOUS | Status: DC | PRN
  Administered 2023-10-08: 1000 mg via TOPICAL

## 2023-10-08 MED ORDER — ACETAMINOPHEN 500 MG PO TABS
1000.0000 mg | ORAL_TABLET | Freq: Once | ORAL | Status: AC
  Administered 2023-10-08: 1000 mg via ORAL
  Filled 2023-10-08: qty 2

## 2023-10-08 MED ORDER — BUPIVACAINE HCL (PF) 0.5 % IJ SOLN
INTRAMUSCULAR | Status: DC | PRN
  Administered 2023-10-08: 10 mL via PERINEURAL

## 2023-10-08 MED ORDER — LACTATED RINGERS IV SOLN: INTRAVENOUS | Status: DC | PRN

## 2023-10-08 MED ORDER — FENTANYL CITRATE PF 50 MCG/ML IJ SOSY
25.0000 ug | PREFILLED_SYRINGE | INTRAMUSCULAR | Status: DC | PRN
Start: 2023-10-08 — End: 2023-10-08
  Administered 2023-10-08: 25 ug via INTRAVENOUS

## 2023-10-08 MED ORDER — MIDAZOLAM HCL 2 MG/2ML IJ SOLN
2.0000 mg | INTRAMUSCULAR | Status: DC
  Filled 2023-10-08: qty 2

## 2023-10-08 MED ORDER — FENTANYL CITRATE PF 50 MCG/ML IJ SOSY
100.0000 ug | PREFILLED_SYRINGE | INTRAMUSCULAR | Status: DC
  Administered 2023-10-08: 50 ug via INTRAVENOUS
  Filled 2023-10-08: qty 2

## 2023-10-08 MED ORDER — TRANEXAMIC ACID-NACL 1000-0.7 MG/100ML-% IV SOLN
1000.0000 mg | INTRAVENOUS | Status: DC
  Filled 2023-10-08: qty 100

## 2023-10-08 MED ORDER — ONDANSETRON HCL 4 MG/2ML IJ SOLN
INTRAMUSCULAR | Status: DC | PRN
  Administered 2023-10-08: 4 mg via INTRAVENOUS

## 2023-10-08 MED ORDER — SUGAMMADEX SODIUM 200 MG/2ML IV SOLN
INTRAVENOUS | Status: DC | PRN
Start: 2023-10-08 — End: 2023-10-08
  Administered 2023-10-08: 200 mg via INTRAVENOUS

## 2023-10-08 MED ORDER — OXYCODONE HCL 5 MG/5ML PO SOLN: 5.0000 mg | Freq: Once | ORAL | Status: AC | PRN

## 2023-10-08 MED ORDER — OXYCODONE HCL 5 MG PO TABS: 5.0000 mg | ORAL_TABLET | Freq: Three times a day (TID) | ORAL | 0 refills | Status: DC | PRN

## 2023-10-08 MED ORDER — PROPOFOL 10 MG/ML IV BOLUS
INTRAVENOUS | Status: DC | PRN
  Administered 2023-10-08: 120 mg via INTRAVENOUS

## 2023-10-08 MED ORDER — DEXAMETHASONE SODIUM PHOSPHATE 10 MG/ML IJ SOLN
INTRAMUSCULAR | Status: DC | PRN
Start: 2023-10-08 — End: 2023-10-08
  Administered 2023-10-08: 5 mg via INTRAVENOUS

## 2023-10-08 SURGICAL SUPPLY — 61 items
AUG BASEPLATE 15DEG 25 WEDGE (Joint) ×1 IMPLANT
AUGMENT BASEPLATE 15DEG 25 WDG (Joint) IMPLANT
BAG COUNTER SPONGE SURGICOUNT (BAG) IMPLANT
BAG ZIPLOCK 12X15 (MISCELLANEOUS) ×2 IMPLANT
BIT DRILL 3.2 PERIPHERAL SCREW (BIT) IMPLANT
BLADE SAG 18X100X1.27 (BLADE) ×2 IMPLANT
CEMENT BONE DEPUY (Cement) IMPLANT
COOLER ICEMAN CLASSIC (MISCELLANEOUS) ×2 IMPLANT
COVER BACK TABLE 60X90IN (DRAPES) ×2 IMPLANT
COVER SURGICAL LIGHT HANDLE (MISCELLANEOUS) ×2 IMPLANT
CUP HUM SYS INSERT SZ 1/2 36 (Joint) IMPLANT
DRAPE SHEET LG 3/4 BI-LAMINATE (DRAPES) ×2 IMPLANT
DRAPE SURG 17X11 SM STRL (DRAPES) ×2 IMPLANT
DRAPE SURG ORHT 6 SPLT 77X108 (DRAPES) ×4 IMPLANT
DRAPE TOP 10253 STERILE (DRAPES) ×2 IMPLANT
DRAPE U-SHAPE 47X51 STRL (DRAPES) ×2 IMPLANT
DRSG AQUACEL AG ADV 3.5X 6 (GAUZE/BANDAGES/DRESSINGS) IMPLANT
DRSG AQUACEL AG ADV 3.5X10 (GAUZE/BANDAGES/DRESSINGS) ×2 IMPLANT
DURAPREP 26ML APPLICATOR (WOUND CARE) IMPLANT
ELECT REM PT RETURN 15FT ADLT (MISCELLANEOUS) ×2 IMPLANT
FACESHIELD WRAPAROUND (MASK) ×1 IMPLANT
FACESHIELD WRAPAROUND OR TEAM (MASK) ×2 IMPLANT
GLOVE BIO SURGEON STRL SZ7.5 (GLOVE) ×8 IMPLANT
GLOVE BIOGEL PI IND STRL 8 (GLOVE) ×4 IMPLANT
GOWN STRL REUS W/ TWL XL LVL3 (GOWN DISPOSABLE) ×4 IMPLANT
GUIDE GLENOID PATIENT REVERSED (MISCELLANEOUS) IMPLANT
GUIDE PIN 3X75 SHOULDER (PIN) ×1 IMPLANT
GUIDEWIRE GLENOID 2.5X220 (WIRE) IMPLANT
HEAD CANN REV SHLD STD 36 (Shoulder) IMPLANT
KIT BASIN OR (CUSTOM PROCEDURE TRAY) ×2 IMPLANT
KIT TURNOVER KIT A (KITS) IMPLANT
MANIFOLD NEPTUNE II (INSTRUMENTS) ×2 IMPLANT
NDL TAPERED W/ NITINOL LOOP (MISCELLANEOUS) IMPLANT
NEEDLE TAPERED W/ NITINOL LOOP (MISCELLANEOUS) IMPLANT
NS IRRIG 1000ML POUR BTL (IV SOLUTION) ×2 IMPLANT
PACK SHOULDER (CUSTOM PROCEDURE TRAY) ×2 IMPLANT
PAD CAST 4YDX4 CTTN HI CHSV (CAST SUPPLIES) ×2 IMPLANT
PAD COLD SHLDR WRAP-ON (PAD) ×2 IMPLANT
PIN GUIDE 3X75 SHOULDER (PIN) IMPLANT
RESTRAINT HEAD UNIVERSAL NS (MISCELLANEOUS) IMPLANT
SCREW 5.0X18 (Screw) IMPLANT
SCREW 5.5X14 (Screw) IMPLANT
SCREW 5.5X22 (Screw) IMPLANT
SCREW BONE THREAD 6.5X35 (Screw) IMPLANT
SLING ARM IMMOBILIZER MED (SOFTGOODS) ×2 IMPLANT
SMARTMIX MINI TOWER (MISCELLANEOUS) IMPLANT
SPONGE T-LAP 4X18 ~~LOC~~+RFID (SPONGE) IMPLANT
STEM HUMERAL PLUS SHORT SZ2+ (Orthopedic Implant) IMPLANT
STRIP CLOSURE SKIN 1/2X4 (GAUZE/BANDAGES/DRESSINGS) ×2 IMPLANT
SUCTION TUBE FRAZIER 10FR DISP (SUCTIONS) IMPLANT
SUT FIBERWIRE #2 38 T-5 BLUE (SUTURE) IMPLANT
SUT MNCRL AB 3-0 PS2 18 (SUTURE) ×2 IMPLANT
SUT MON AB 2-0 CT1 36 (SUTURE) ×2 IMPLANT
SUT VIC AB 0 CT1 36 (SUTURE) ×2 IMPLANT
SUT VIC AB 1 CT1 36 (SUTURE) ×2 IMPLANT
SUTURE FIBERWR #2 38 T-5 BLUE (SUTURE) IMPLANT
SUTURE TAPE 1.3 40 TPR END (SUTURE) ×2 IMPLANT
SUTURETAPE 1.3 40 TPR END (SUTURE) ×1 IMPLANT
TOWEL OR 17X26 10 PK STRL BLUE (TOWEL DISPOSABLE) ×2 IMPLANT
TOWER SMARTMIX MINI (MISCELLANEOUS) IMPLANT
TUBE SUCTION HIGH CAP CLEAR NV (SUCTIONS) ×2 IMPLANT

## 2023-10-08 NOTE — Anesthesia Procedure Notes (Signed)
 Anesthesia Regional Block: Interscalene brachial plexus block   Pre-Anesthetic Checklist: , timeout performed,  Correct Patient, Correct Site, Correct Laterality,  Correct Procedure, Correct Position, site marked,  Risks and benefits discussed,  Surgical consent,  Pre-op evaluation,  At surgeon's request and post-op pain management  Laterality: Left  Prep: chloraprep       Needles:  Injection technique: Single-shot  Needle Type: Echogenic Stimulator Needle     Needle Length: 9cm  Needle Gauge: 21     Additional Needles:   Procedures:,,,, ultrasound used (permanent image in chart),,    Narrative:  Start time: 10/08/2023 8:26 AM End time: 10/08/2023 8:30 AM Injection made incrementally with aspirations every 5 mL.  Performed by: Personally  Anesthesiologist: Linton Rump, MD  Additional Notes: Discussed risks and benefits of nerve block including, but not limited to, prolonged and/or permanent nerve injury involving sensory and/or motor function. Monitors were applied and a time-out was performed. The nerve and associated structures were visualized under ultrasound guidance. After negative aspiration, local anesthetic was slowly injected around the nerve. There was no evidence of high pressure during the procedure. There were no paresthesias. VSS remained stable and the patient tolerated the procedure well.

## 2023-10-08 NOTE — Anesthesia Procedure Notes (Signed)
 Procedure Name: Intubation Date/Time: 10/08/2023 10:36 AM  Performed by: Deri Fuelling, CRNAPre-anesthesia Checklist: Patient identified, Emergency Drugs available, Suction available and Patient being monitored Patient Re-evaluated:Patient Re-evaluated prior to induction Oxygen Delivery Method: Circle system utilized Preoxygenation: Pre-oxygenation with 100% oxygen Induction Type: IV induction Ventilation: Mask ventilation without difficulty Laryngoscope Size: Mac and 3 Grade View: Grade I Tube type: Oral Tube size: 7.0 mm Number of attempts: 1 Airway Equipment and Method: Stylet and Oral airway Placement Confirmation: ETT inserted through vocal cords under direct vision, positive ETCO2 and breath sounds checked- equal and bilateral Secured at: 21 cm Tube secured with: Tape Dental Injury: Teeth and Oropharynx as per pre-operative assessment

## 2023-10-08 NOTE — Discharge Instructions (Addendum)
 Orthopedic surgery discharge instructions:  -Maintain postoperative bandage until follow-up appointment.  This is waterproof, and you may begin showering on postoperative day #3.  Do not submerge underwater.  Maintain that bandage until your follow-up appointment in 2 weeks.  -No lifting over 2 pounds with operateive arm.  You may use the arm immediately for activities of daily living such as bathing, washing your face and brushing your teeth, eating, and getting dressed.  Otherwise maintain your sling when you are out of the house and sleeping.  -Apply ice liberally to the shoulder throughout the day.  For mild to moderate pain use Tylenol and Advil as needed around-the-clock.  For breakthrough pain use oxycodone as necessary.  -You will return to see Dr. Aundria Rud in the office in 2 weeks for routine postoperative check with x-rays.

## 2023-10-08 NOTE — Op Note (Signed)
 10/08/2023  11:57 AM  PATIENT:  Linda Berg    PRE-OPERATIVE DIAGNOSIS:  Left shoulder rotator cuff arthropathy  POST-OPERATIVE DIAGNOSIS:  Same  PROCEDURE:  REVERSE SHOULDER ARTHROPLASTY  SURGEON:  Yolonda Kida, MD  ASSISTANT: Dion Saucier, PA-C  ANESTHESIA:   General With interscalene Exparel block  ESTIMATED BLOOD LOSS: 100 cc  PREOPERATIVE INDICATIONS:  Linda Berg is a  78 y.o. female with a diagnosis of Left shoulder rotator cuff arthropathy who failed conservative measures and elected for surgical management.    The risks benefits and alternatives were discussed with the patient preoperatively including but not limited to the risks of infection, bleeding, nerve injury, cardiopulmonary complications, the need for revision surgery, dislocation, brachial plexus palsy, incomplete relief of pain, among others, and the patient was willing to proceed.  OPERATIVE IMPLANTS:  Tornier size 2+ humeral stem with a retentive polyethylene liner  Small full wedge augment glenoid baseplate with a 30 mm central screw and 4 peripheral screws, 1 cortical and 3 locking.  Size 36 mm glenosphere  OPERATIVE FINDINGS: Advanced osteoarthritis of this left shoulder with full-thickness cartilage wear of the glenoid.  Partial-thickness central humeral head wear.  Rotator cuff intact x 4.  Long head of biceps intact as well.  Moderate synovitis.  Bone quality was fair.  OPERATIVE PROCEDURE: The patient was brought to the operating room and placed in the supine position. General anesthesia was administered. IV antibiotics were given. A Foley was not placed. Time out was performed. The upper extremity was prepped and draped in usual sterile fashion. The patient was in a beachchair position. Deltopectoral approach was carried out. The biceps was tenodesed to the pectoralis tendon with #2 Fiberwire. The subscapularis was released off of the bone.   I then  performed circumferential releases of the humerus, and then dislocated the head, and then reamed with the reamer to the above named size.  I then applied the jig, and cut the humeral head in 30 of retroversion, and then turned my attention to the glenoid.  Deep retractors were placed, and I resected the labrum, and then placed a guidepin into the center position on the glenoid, with slight inferior inclination.  This was done utilizing the Tornier blueprint preoperative planning software with 3D printed guide for the small native glenoid.  I then reamed over the guidepin, and this created a small metaphyseal cancellus blush inferiorly, removing just the cartilage to the subchondral bone superiorly. The base plate was selected and impacted place, and then I secured it centrally with a nonlocking screw, and I had excellent purchase both inferiorly and superiorly. I placed a short locking screws on anterior and posterior aspects.  I then turned my attention to the glenosphere, and impacted this into place, and secondarily secured with the setscrew.  The glenoid sphere was completely seated, and had engagement of the [facility] taper. I then turned my attention back to the humerus.  I sequentially broached, and then trialed, and was found to restore soft tissue tension, and it had 2 finger tightness. Therefore the above named components were selected. The shoulder felt stable throughout functional motion.   I then impacted the real prosthesis into place, as well as the real humeral tray, and reduced the shoulder. The shoulder had excellent motion, and was stable, and I irrigated the wounds copiously.   Subscapularis was not repaired.  I then irrigated the shoulder copiously once more, placed 1 g of vancomycin powder into the deltopectoral interval,  repaired the deltopectoral interval with # 1 Vicryl, followed by subcutaneous 0 Vicryl for fat, 2-0 Monocryl for subcutaneous dermis, then monocryl for the skin,   with Steri-Strips and sterile gauze for the skin. The patient was awakened and returned back in stable and satisfactory condition. There no complications and they tolerated the procedure well.  All counts were correct x2.   Disposition:  [name] will be in her sling for 2 weeks postoperatively, but may begin using the left upper extremity immediately for activities of daily living.  We will discharge home today from PACU and see her back in the office in 2 weeks for routine postop check including 2 views of the left shoulder.

## 2023-10-08 NOTE — Transfer of Care (Signed)
 Immediate Anesthesia Transfer of Care Note  Patient: Linda Berg  Procedure(s) Performed: REVERSE SHOULDER ARTHROPLASTY (Left: Shoulder)  Patient Location: PACU  Anesthesia Type:GA combined with regional for post-op pain  Level of Consciousness: awake and alert   Airway & Oxygen Therapy: Patient Spontanous Breathing and Patient connected to face mask oxygen  Post-op Assessment: Report given to RN and Post -op Vital signs reviewed and stable  Post vital signs: Reviewed and stable  Last Vitals:  Vitals Value Taken Time  BP 135/75 10/08/23 1145  Temp    Pulse 70 10/08/23 1146  Resp 23 10/08/23 1146  SpO2 100 % 10/08/23 1146  Vitals shown include unfiled device data.  Last Pain:  Vitals:   10/08/23 0826  TempSrc:   PainSc: 0-No pain         Complications: No notable events documented.

## 2023-10-08 NOTE — Anesthesia Postprocedure Evaluation (Signed)
 Anesthesia Post Note  Patient: Phylisha Dix  Procedure(s) Performed: REVERSE SHOULDER ARTHROPLASTY (Left: Shoulder)     Patient location during evaluation: PACU Anesthesia Type: General and Regional Level of consciousness: awake Pain management: pain level controlled Vital Signs Assessment: post-procedure vital signs reviewed and stable Respiratory status: spontaneous breathing, nonlabored ventilation and respiratory function stable Cardiovascular status: blood pressure returned to baseline and stable Postop Assessment: no apparent nausea or vomiting Anesthetic complications: no   No notable events documented.  Last Vitals:  Vitals:   10/08/23 1200 10/08/23 1215  BP:  124/77  Pulse:  67  Resp:  19  Temp: (!) 36.1 C 36.4 C  SpO2:  95%    Last Pain:  Vitals:   10/08/23 1215  TempSrc:   PainSc: 6                  Linton Rump

## 2023-10-08 NOTE — Brief Op Note (Signed)
 10/08/2023  11:53 AM  PATIENT:  Linda Berg  78 y.o. female  PRE-OPERATIVE DIAGNOSIS:  Left shoulder rotator cuff arthropathy  POST-OPERATIVE DIAGNOSIS:  Left shoulder rotator cuff arthropathy  PROCEDURE:  Procedure(s): REVERSE SHOULDER ARTHROPLASTY (Left)  SURGEON:  Surgeons and Role:    * Yolonda Kida, MD - Primary  PHYSICIAN ASSISTANT: Dion Saucier, PA-C  ASSISTANTS: Dion Saucier, PA-C  ANESTHESIA:   regional and general  EBL:  125 mL   BLOOD ADMINISTERED:none  DRAINS: none   LOCAL MEDICATIONS USED:  NONE  SPECIMEN:  No Specimen  DISPOSITION OF SPECIMEN:  N/A  COUNTS:  YES  TOURNIQUET:  * No tourniquets in log *  DICTATION: .Note written in EPIC  PLAN OF CARE: Discharge to home after PACU  PATIENT DISPOSITION:  PACU - hemodynamically stable.   Delay start of Pharmacological VTE agent (>24hrs) due to surgical blood loss or risk of bleeding: not applicable

## 2023-10-08 NOTE — H&P (Signed)
 ORTHOPAEDIC H&P  REQUESTING PHYSICIAN: Yolonda Kida, MD  PCP:  Richmond Campbell., PA-C  Chief Complaint: Left shoulder osteoarthritis  HPI: Linda Berg is a 78 y.o. female who complains of left shoulder pain and weakness.  Here today for reverse arthroplasty left shoulder.  No new complaints.  Past Medical History:  Diagnosis Date   Barrett esophagus    Diverticulosis    Dyslipidemia    Facial numbness    GERD (gastroesophageal reflux disease)    Hearing loss    Hiatal hernia    Hypothyroidism    Rheumatoid arteritis (HCC)    Rotator cuff tear, left    Past Surgical History:  Procedure Laterality Date   CATARACT EXTRACTION, BILATERAL     04/13/2019, 04/27/2019   CHEST TUBE INSERTION  08/17/2017   COLONOSCOPY  08/17/2017   HAMMER TOE SURGERY Left    INTRAUTERINE DEVICE INSERTION     IUD REMOVAL     KNEE ARTHROPLASTY     KNEE ARTHROSCOPY  12/25/11   left   LEFT OOPHORECTOMY  03/13/98   MANDIBLE SURGERY  9/93   pallete expansion   TONSILLECTOMY     TUBAL LIGATION     Social History   Socioeconomic History   Marital status: Married    Spouse name: Not on file   Number of children: 2   Years of education: 12   Highest education level: High school graduate  Occupational History   Occupation: retired    Associate Professor: RETIRED  Tobacco Use   Smoking status: Never    Passive exposure: Past   Smokeless tobacco: Never  Vaping Use   Vaping status: Never Used  Substance and Sexual Activity   Alcohol use: No    Alcohol/week: 0.0 standard drinks of alcohol   Drug use: No   Sexual activity: Not on file  Other Topics Concern   Not on file  Social History Narrative   Live with husband at home.   Left-handed.   1-2 cups per day.   Social Drivers of Health   Financial Resource Strain: Low Risk  (08/30/2021)   Received from [facility] visits prior to 10/10/2022., [facility] visits prior to 10/10/2022.    Overall Financial Resource Strain (CARDIA)    Difficulty of Paying Living Expenses: Not hard at all  Food Insecurity: Low Risk  (09/18/2023)   [facility]   Hunger Vital Sign    Worried About Running Out of Food in the Last Year: Never true    Ran Out of Food in the Last Year: Never true  Transportation Needs: No Transportation Needs (09/18/2023)   Received from [employer]    In the past 12 months, has lack of reliable transportation kept you from medical appointments, meetings, work or from getting things needed for daily living? : No  Physical Activity: Insufficiently Active (08/30/2021)   Received from [facility] visits prior to 10/10/2022., [facility] visits prior to 10/10/2022.   Exercise Vital Sign    Days of Exercise per Week: 1 day    Minutes of Exercise per Session: 30 min  Stress: No Stress Concern Present (08/30/2021)   [facility] visits prior to 10/10/2022., [facility] visits prior to 10/10/2022.   [employer] of Occupational Health - Occupational Stress Questionnaire    Feeling of Stress : Not at all  Social Connections: Socially Integrated (08/30/2021)   Received from [facility] visits prior to 10/10/2022., [facility] visits prior to 10/10/2022.   Social Advertising account executive [NHANES]    Frequency of Communication with Friends and Family: More than three times a week    Frequency of Social Gatherings with Friends and Family: Once a week    Attends Religious Services: More than 4 times per year    Active Member of [employer] or Organizations: Yes    Attends Engineer, structural: 1 to 4 times per year    Marital Status: Married   Family History  Problem Relation Age of Onset   Heart disease Mother    Rheumatic fever Mother    Heart failure Mother    Stroke Father    Diabetes Father     Heart failure Father    Heart disease Maternal Aunt        aunt x 2   Irritable bowel syndrome Son    GER disease Son    Colon cancer Neg Hx    Allergies  Allergen Reactions   Ciprofloxacin Anaphylaxis    Facial numbness   Prednisone Other (See Comments)    Stomach spasms   Terbinafine Rash   Lamisil At [Powders]    Prior to Admission medications   Medication Sig Start Date End Date Taking? Authorizing Provider  AMBULATORY NON FORMULARY MEDICATION ALLERGY SHOTS Once a week   Yes [provider]  aspirin 81 MG tablet Take 81 mg by mouth daily.    Yes [provider]  Biotin 5000 MCG TABS Take 5,000 mcg by mouth daily.   Yes [provider]  cholecalciferol (VITAMIN D) 1000 units tablet Take 1,000 Units by mouth daily.   Yes [provider]  cyanocobalamin (VITAMIN B12) 1000 MCG tablet Take 1,000 mcg by mouth daily.   Yes [provider]  cyclobenzaprine (FLEXERIL) 5 MG tablet Take 5 mg by mouth daily as needed for muscle spasms (TMJ). 09/23/22  Yes [provider]  cycloSPORINE (RESTASIS) 0.05 % ophthalmic emulsion Place 1 drop into both eyes 2 (two) times daily.    Yes [provider]  esomeprazole (NEXIUM) 20 MG capsule Take 20 mg by mouth 2 (two) times daily before a meal.   Yes [provider]  fexofenadine (ALLEGRA) 180 MG tablet Take 180 mg by mouth daily.   Yes [provider]  fluticasone (FLONASE) 50 MCG/ACT nasal spray Place 1 spray into both nostrils daily as needed for allergies.   Yes [provider]  hydroxychloroquine (PLAQUENIL) 200 MG tablet Take 1 tablet (200 mg total) by mouth daily. 03/07/23  Yes Deveshwar, Janalyn Rouse, MD  Krill Oil 500 MG CAPS Take 500 mg by mouth daily.   Yes [provider]  lovastatin (MEVACOR) 20 MG tablet Take 20 mg by mouth daily.   Yes [provider]  Magnesium 250 MG TABS Take 250 mg by mouth daily.   Yes [provider]   Peppermint Oil (IBGARD) 90 MG CPCR Take 1 capsule by mouth daily.   Yes [provider]  polyethylene glycol (MIRALAX / GLYCOLAX) 17 g packet Take 8.5 g by mouth daily as needed for moderate constipation. 1/2 capful daily   Yes [provider]  Probiotic Product (PROBIOTIC DAILY PO) Take 1 tablet by mouth daily.    Yes [provider]  thyroid (ARMOUR) 60 MG tablet Take 60 mg by mouth daily before breakfast.  Yes [provider]  vitamin C (ASCORBIC ACID) 500 MG tablet Take 1,000 mg by mouth 2 (two) times daily.    Yes [provider]  Zinc 30 MG CAPS Take 30 mg by mouth daily.   Yes [provider]  EPINEPHrine 0.3 mg/0.3 mL IJ SOAJ injection Inject 0.3 mg into the muscle as needed for anaphylaxis. 04/29/17   [provider]   No results found.  Positive ROS: All other systems have been reviewed and were otherwise negative with the exception of those mentioned in the HPI and as above.  Physical Exam: General: Alert, no acute distress Cardiovascular: No pedal edema Respiratory: No cyanosis, no use of accessory musculature GI: No organomegaly, abdomen is soft and non-tender Skin: No lesions in the area of chief complaint Neurologic: Sensation intact distally Psychiatric: Patient is competent for consent with normal mood and affect Lymphatic: No axillary or cervical lymphadenopathy  MUSCULOSKELETAL: Left upper extremity warm and well-perfused with no open wounds or lesions.  Neurovascular intact.  Assessment: Left shoulder osteoarthritis  Plan: Plan to proceed today with reverse arthroplasty left shoulder for definitive treatment for end-stage osteoarthritis with some deformity on the glenoid.  Discussed the risk of bleeding, infection, damage to surrounding nerves and vessels, dislocation, fracture, need for revision surgery, persistent pain and weakness, as well as the risk of anesthesia.  She has provided informed  consent.  Admit to overnight ops postop.    Yolonda Kida, MD Cell [phone]    10/08/2023 8:13 AM

## 2023-10-11 ENCOUNTER — Encounter (HOSPITAL_COMMUNITY): Payer: Self-pay | Admitting: Orthopedic Surgery

## 2023-10-21 ENCOUNTER — Other Ambulatory Visit: Payer: Self-pay | Admitting: Physician Assistant

## 2023-10-21 DIAGNOSIS — M0579 Rheumatoid arthritis with rheumatoid factor of multiple sites without organ or systems involvement: Secondary | ICD-10-CM

## 2023-10-21 NOTE — Telephone Encounter (Signed)
 Last Fill: 03/07/2023  Eye exam: 09/16/2023 WNL    Labs: 09/27/2023 RBC 4.04, MCV 97.8  Next Visit: 01/05/2024  Last Visit: 08/09/2023  HQ:[id number] arthritis involving multiple sites with positive rheumatoid factor   Current Dose per office note 08/09/2023: Plaquenil 200 mg 1 tablet by mouth daily   Okay to refill Plaquenil?

## 2023-11-08 NOTE — Progress Notes (Deleted)
 [facility] Return Visit   Referring Provider Linda Berg., PA-C [address]  [place] [address]  Primary Care Provider Linda Berg., PA-C  Patient Profile: Linda Berg is a 78 y.o. female who returns to the [facility] for follow-up of the problem(s) noted below.  Problem List: GERD Dysphagia Barrrett's esophagus Constipation History of colonoscopy complicated by rectosigmoid perforation treated with clips 2019   History of Present Illness   Linda Berg was last seen in the GI office***   Current GI Meds  Esomeprazole 20 mg p.o. twice daily MiraLAX Metamucil  Interval History    Last colonoscopy:  08/2017 - diverticulosis of sigmoid colon, mucosal tear in the rectosigmoid colon treated with clips Last endoscopy:  08/2017 - irregular Z-line with islands of salmon-pink mucosa at 38 cm from incisors, otherwise normal - focal IM on biopsies  Last Abd CT/CTE/MRE: ***  GI Review of Symptoms Significant for {GIROS:50592}. Otherwise negative.  General Review of Systems  Review of systems is significant for the pertinent positives and negatives as listed per the HPI.  Full ROS is otherwise negative.  Past Medical History   Past Medical History:  Diagnosis Date   Barrett esophagus    Diverticulosis    Dyslipidemia    Facial numbness    GERD (gastroesophageal reflux disease)    Hearing loss    Hiatal hernia    Hypothyroidism    Rheumatoid arteritis (HCC)    Rotator cuff tear, left      Past Surgical History   Past Surgical History:  Procedure Laterality Date   CATARACT EXTRACTION, BILATERAL     04/13/2019, 04/27/2019   CHEST TUBE INSERTION  08/17/2017   COLONOSCOPY  08/17/2017   HAMMER TOE SURGERY Left    INTRAUTERINE DEVICE INSERTION     IUD REMOVAL     KNEE ARTHROPLASTY     KNEE ARTHROSCOPY  12/25/11   left   LEFT OOPHORECTOMY  03/13/98   MANDIBLE SURGERY  9/93   pallete  expansion   REVERSE SHOULDER ARTHROPLASTY Left 10/08/2023   Procedure: REVERSE SHOULDER ARTHROPLASTY;  Surgeon: Yolonda Kida, MD;  Location: [facility];  Service: Orthopedics;  Laterality: Left;   TONSILLECTOMY     TUBAL LIGATION       Allergies and Medications   Allergies  Allergen Reactions   Ciprofloxacin Anaphylaxis    Facial numbness   Prednisone Other (See Comments)    Stomach spasms   Terbinafine Rash   Lamisil At [Powders]     @MEDSTODAY @  Family History   Family History  Problem Relation Age of Onset   Heart disease Mother    Rheumatic fever Mother    Heart failure Mother    Stroke Father    Diabetes Father    Heart failure Father    Heart disease Maternal Aunt        aunt x 2   Irritable bowel syndrome Son    GER disease Son    Colon cancer Neg Hx    GI Specific Family History: {gifamhx:50061}   Social History   Social History   Tobacco Use   Smoking status: Never    Passive exposure: Past   Smokeless tobacco: Never  Vaping Use   Vaping status: Never Used  Substance Use Topics   Alcohol use: No    Alcohol/week: 0.0 standard drinks of alcohol   Drug use: No   Linda Berg reports that she has never smoked. She has been  exposed to tobacco smoke. She has never used smokeless tobacco. She reports that she does not drink alcohol and does not use drugs.  Vital Signs and Physical Examination  There were no vitals filed for this visit. There is no height or weight on file to calculate BMI.    General: Well developed, well nourished, no acute distress Head: Normocephalic and atraumatic Eyes: Sclerae anicteric, EOMI Ears: Normal auditory acuity Mouth: No deformities or lesions noted Lungs: Clear throughout to auscultation Heart: Regular rate and rhythm; No murmurs, rubs or bruits Abdomen: Soft, non tender and non distended. No masses, hepatosplenomegaly or hernias noted. Normal Bowel sounds Rectal: Musculoskeletal: Symmetrical with no gross  deformities  Pulses:  Normal pulses noted Extremities: No edema or deformities noted Neurological: Alert oriented x 4, grossly nonfocal Psychological:  Alert and cooperative. Normal mood and affect   Review of Data  The following data was reviewed at the time of this encounter:  Laboratory Studies      Latest Ref Rng & Units 09/27/2023    1:11 PM 08/05/2023    2:55 PM 03/08/2023    2:28 PM  CBC  WBC 4.0 - 10.5 K/uL 5.7  5.9  3.6   Hemoglobin 12.0 - 15.0 g/dL 25.3  66.4  40.3   Hematocrit 36.0 - 46.0 % 40.2  40.6  37.7   Platelets 150 - 400 K/uL 244  281  206     No results found for: "LIPASE"    Latest Ref Rng & Units 09/27/2023    1:11 PM 08/05/2023    2:55 PM 03/08/2023    2:28 PM  CMP  Glucose 70 - 99 mg/dL 97  73  89   BUN 8 - 23 mg/dL 16  19  15    Creatinine 0.44 - 1.00 mg/dL 4.74  2.59  5.63   Sodium 135 - 145 mmol/L 136  136  138   Potassium 3.5 - 5.1 mmol/L 4.5  4.3  4.3   Chloride 98 - 111 mmol/L 100  100  102   CO2 22 - 32 mmol/L 26  26  25    Calcium 8.9 - 10.3 mg/dL 9.1  9.7  9.1   Total Protein 6.1 - 8.1 g/dL  7.7  6.4   Total Bilirubin 0.2 - 1.2 mg/dL  0.3  0.3   AST 10 - 35 U/L  22  18   ALT 6 - 29 U/L  22  14      Imaging Studies    GI Procedures and Studies     Clinical Impression  It is my clinical impression that Ms. Fair is a 78 y.o. female with;  ***  Plan  *** *** *** *** ***   Planned Follow Up No follow-ups on file.  The patient or caregiver verbalized understanding of the material covered, with no barriers to understanding. All questions were answered. Patient or caregiver is agreeable with the plan outlined above.    It was a pleasure to see [name].  If you have any questions or concerns regarding this evaluation, do not hesitate to contact me.  Maren Beach, MD [facility]

## 2023-11-10 ENCOUNTER — Ambulatory Visit: Payer: Medicare Other | Admitting: Pediatrics

## 2023-11-10 DIAGNOSIS — K219 Gastro-esophageal reflux disease without esophagitis: Secondary | ICD-10-CM

## 2023-11-10 DIAGNOSIS — K2270 Barrett's esophagus without dysplasia: Secondary | ICD-10-CM

## 2023-11-17 LAB — HM DEXA SCAN

## 2023-11-24 ENCOUNTER — Telehealth: Payer: Self-pay | Admitting: *Deleted

## 2023-11-24 NOTE — Telephone Encounter (Signed)
 Received DEXA results from [facility].  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   Significant changes in BMD and site measured (5% and above): -8% Left 1/3 Radius, -16% Right total femur, -8% Left Total Femur  Current Regimen: Vitamin D  Recommendation: Discuss treatment options at follow up visit  Reviewed by: Jacinta Martinis, PA-C  Next Appointment:  01/05/2024

## 2023-12-02 ENCOUNTER — Encounter: Payer: Self-pay | Admitting: Rheumatology

## 2023-12-22 NOTE — Progress Notes (Unsigned)
 Office Visit Note  Patient: Linda Berg             Date of Birth: 12-02-1945           MRN: [id number]             PCP: Lory Rough., PA-C Referring: Lory Rough., PA-C Visit Date: 01/05/2024 Occupation: @GUAROCC @  Subjective:  Discuss DEXA results   History of Present Illness: Linda Berg is a 78 y.o. female with history of seropositive rheumatoid arthritis and sjogren's syndrome.  Patient remains on Plaquenil  200 mg 1 tablet by mouth daily.  She is tolerating Plaquenil  without any side effects and has not had any recent gaps in therapy.  Patient underwent a reverse left total shoulder replacement on 10/08/2023 performed by Dr. Hiram Lukes.  Patient has been going to physical therapy on a weekly basis at [facility].  She continues to have some discomfort and limitation with internal rotation.  She has continued home exercises on a daily basis.  Patient continues to have some discomfort in the right shoulder but is hoping to not have to proceed with surgical intervention in the future.  She continues to experience intermittent arthralgias and joint stiffness but denies any joint swelling. Patient had an updated bone density on 11/17/2023 and would like to discuss results and treatment options today.  She has been unable to take calcium due to increased constipation.  Patient recently had a fall while walking on uneven terrain but did not fracture anything.    Activities of Daily Living:  Patient reports morning stiffness for 30 minutes.   Patient Reports nocturnal pain.  Difficulty dressing/grooming: Reports Difficulty climbing stairs: Denies Difficulty getting out of chair: Denies Difficulty using hands for taps, buttons, cutlery, and/or writing: Reports  Review of Systems  Constitutional:  Positive for fatigue.  HENT:  Positive for mouth sores and mouth dryness.   Eyes:  Positive for dryness.  Respiratory:  Negative for shortness of  breath.   Cardiovascular:  Positive for palpitations. Negative for chest pain.  Gastrointestinal:  Positive for constipation. Negative for blood in stool and diarrhea.  Endocrine: Negative for increased urination.  Genitourinary:  Negative for involuntary urination.  Musculoskeletal:  Positive for joint pain, gait problem, joint pain, joint swelling, myalgias, morning stiffness and myalgias. Negative for muscle weakness and muscle tenderness.  Skin:  Positive for color change. Negative for rash, hair loss and sensitivity to sunlight.  Allergic/Immunologic: Negative for susceptible to infections.  Neurological:  Negative for dizziness and headaches.  Hematological:  Negative for swollen glands.  Psychiatric/Behavioral:  Positive for sleep disturbance. Negative for depressed mood. The patient is not nervous/anxious.     PMFS History:  Patient Active Problem List   Diagnosis Date Noted   Paroxysmal atrial tachycardia (HCC) 05/25/2023   Irregular heart beat 09/16/2022   Language difficulty 08/17/2018   Nonintractable headache 08/17/2018   Upper airway cough syndrome 10/13/2017   Pneumomediastinum (HCC)    Pneumoperitoneum    Acute respiratory failure (HCC)    Pneumothorax, traumatic    Anaphylaxis 08/17/2017   Trochanteric bursitis of both hips 03/16/2017   Rheumatoid arthritis involving multiple sites with positive rheumatoid factor (HCC) 10/09/2016   Sicca syndrome (HCC) 10/09/2016   Osteopenia of multiple sites 10/09/2016   History of scoliosis 10/09/2016   History of gastroesophageal reflux (GERD) 10/09/2016   History of hypothyroidism 10/09/2016   History of hyperlipidemia 10/09/2016   High risk medication use 10/09/2016  Primary osteoarthritis of both hands 10/09/2016   Paresthesia 10/11/2014   Hyperlipidemia 10/11/2014   Small vessel disease, 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/11/2014   Tinnitus 09/17/2014   Abdominal pain 09/05/2014   Acute cystitis 09/05/2014   Arthritis 09/05/2014    Cough 09/05/2014   Esophagitis 09/05/2014   Facial numbness 09/05/2014   Difficulty hearing 09/05/2014   H/O disease 09/05/2014   HLD (hyperlipidemia) 09/05/2014   Muscle ache 09/05/2014   Symptoms involving urinary system 09/05/2014   Awareness of heartbeats 09/05/2014   Acne erythematosa 09/05/2014   Absence of bladder continence 09/05/2014   Urinary urgency 09/05/2014   Routine general medical examination at a health care facility 09/05/2014   DYSPHAGIA 01/09/2009   HYPOTHYROIDISM 11/04/2007   DYSLIPIDEMIA 11/04/2007   GERD 11/04/2007   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/04/2007   DUODENITIS, WITHOUT HEMORRHAGE 11/04/2007   HIATAL HERNIA 11/04/2007   DIVERTICULOSIS, COLON 11/04/2007   CONSTIPATION, CHRONIC 11/04/2007   RECTAL BLEEDING 11/04/2007    Past Medical History:  Diagnosis Date   Barrett esophagus    Diverticulosis    Dyslipidemia    Facial numbness    GERD (gastroesophageal reflux disease)    Hearing loss    Hiatal hernia    Hypothyroidism    Rheumatoid arteritis (HCC)    Rotator cuff tear, left     Family History  Problem Relation Age of Onset   Heart disease Mother    Rheumatic fever Mother    Heart failure Mother    Stroke Father    Diabetes Father    Heart failure Father    Heart disease Maternal Aunt        aunt x 2   Irritable bowel syndrome Son    GER disease Son    Colon cancer Neg Hx    Past Surgical History:  Procedure Laterality Date   CATARACT EXTRACTION, BILATERAL     04/13/2019, 04/27/2019   CHEST TUBE INSERTION  08/17/2017   COLONOSCOPY  08/17/2017   HAMMER TOE SURGERY Left    INTRAUTERINE DEVICE INSERTION     IUD REMOVAL     KNEE ARTHROPLASTY     KNEE ARTHROSCOPY  12/25/11   left   LEFT OOPHORECTOMY  03/13/98   MANDIBLE SURGERY  9/93   pallete expansion   REVERSE SHOULDER ARTHROPLASTY Left 10/08/2023   Procedure: REVERSE SHOULDER ARTHROPLASTY;  Surgeon: Janeth Medicus, MD;  Location: [facility];  Service: Orthopedics;  Laterality: Left;    TONSILLECTOMY     TUBAL LIGATION     Social History   Social History Narrative   Live with husband at home.   Left-handed.   1-2 cups per day.   Immunization History  Administered Date(s) Administered   Influenza, High Dose Seasonal PF 04/29/2016, 05/03/2017   Influenza-Unspecified 05/21/2010, 04/28/2013, 05/17/2014, 04/30/2015, 05/03/2017, 05/10/2018   PFIZER(Purple Top)SARS-COV-2 Vaccination 09/02/2019, 09/23/2019, 05/06/2020   Pfizer Covid-19 Vaccine Bivalent Booster 19yrs & up 05/23/2021   Pfizer(Comirnaty)Fall Seasonal Vaccine 12 years and older 06/07/2023   Pneumococcal Conjugate-13 03/29/2014   Pneumococcal Polysaccharide-23 05/21/2010, 09/05/2018   Zoster Recombinant(Shingrix) 12/10/2017, 04/08/2018   Zoster, Live 10/19/2007     Objective: Vital Signs: BP 108/70 (BP Location: Left Arm, Patient Position: Sitting, Cuff Size: Normal)   Pulse 73   Resp 16   Ht 5' 0.25" (1.53 m)   Wt 118 lb 3.2 oz (53.6 kg)   BMI 22.89 kg/m    Physical Exam Vitals and nursing note reviewed.  Constitutional:      Appearance: She is well-developed.  HENT:     Head: Normocephalic and atraumatic.  Eyes:     Conjunctiva/sclera: Conjunctivae normal.  Cardiovascular:     Rate and Rhythm: Normal rate and regular rhythm.     Heart sounds: Normal heart sounds.  Pulmonary:     Effort: Pulmonary effort is normal.     Breath sounds: Normal breath sounds.  Abdominal:     General: Bowel sounds are normal.     Palpations: Abdomen is soft.  Musculoskeletal:     Cervical back: Normal range of motion.  Lymphadenopathy:     Cervical: No cervical adenopathy.  Skin:    General: Skin is warm and dry.     Capillary Refill: Capillary refill takes 2 to 3 seconds. Mild cyanosis of the left index and thumb fingertips noted.  No digital ulcerations noted. Neurological:     Mental Status: She is alert and oriented to person, place, and time.  Psychiatric:        Behavior: Behavior normal.       Musculoskeletal Exam: C-spine has limited range of motion with lateral rotation.  Thoracic kyphosis noted.  Right shoulder has full range of motion with some discomfort.  Left shoulder replacement has painful limited internal rotation.  Elbow joints, wrist joints, MCPs, PIPs, DIPs have good range of motion with no synovitis.  Complete fist formation bilaterally.  CMC, PIP, DIP thickening consistent with osteoarthritis of both hands.  Hip joints have good range of motion with no groin pain.  Knee joints have good range of motion with no warmth or effusion.  Ankle joints have good range of motion with no tenderness or joint swelling.  CDAI Exam: CDAI Score: -- Patient Global: --; Provider Global: -- Swollen: --; Tender: -- Joint Exam 01/05/2024   No joint exam has been documented for this visit   There is currently no information documented on the homunculus. Go to the Rheumatology activity and complete the homunculus joint exam.  Investigation: No additional findings.  Imaging: No results found.  Recent Labs: Lab Results  Component Value Date   WBC 3.9 12/30/2023   HGB 12.8 12/30/2023   PLT 233 12/30/2023   NA 137 12/30/2023   K 5.0 12/30/2023   CL 101 12/30/2023   CO2 30 12/30/2023   GLUCOSE 82 12/30/2023   BUN 23 12/30/2023   CREATININE 0.63 12/30/2023   BILITOT 0.3 12/30/2023   ALKPHOS 84 12/07/2019   AST 19 12/30/2023   ALT 17 12/30/2023   PROT 6.8 12/30/2023   ALBUMIN 4.4 12/07/2019   CALCIUM 9.5 12/30/2023   GFRAA 100 02/04/2021    Speciality Comments: PLQ eye exam: 09/16/2023 WNL @ [facility] Follow up in 1 year  Procedures:  No procedures performed Allergies: Ciprofloxacin, Prednisone, Terbinafine, and Lamisil at [powders]    Assessment / Plan:     Visit Diagnoses: Rheumatoid arthritis involving multiple sites with positive rheumatoid factor (HCC): She has no synovitis on examination today.  She has not had any signs or symptoms of a rheumatoid  arthritis flare.  She has clinically been doing well taking Plaquenil  200 mg 1 tablet by mouth daily.  She is tolerating Plaquenil  without any side effects and has not had any recent gaps in therapy.  No medication changes will be made at this time.  She was advised to notify us  if she develops signs or symptoms of a flare.  She will follow-up in the office in 5 months or sooner if needed.  High risk medication use -  Plaquenil  200 mg 1 tablet by mouth daily.  CBC and CMP WNL on 12/30/23.   Lipid panel within normal limits on 09/27/2023. PLQ eye exam: 09/16/2023 WNL @ [facility] Follow up in 1 year   Sjogren's syndrome with other organ involvement (HCC) - Repeat ANA negative (03/27/20), Ro+, La-: Patient continues to have chronic sicca symptoms--stable.  SPEP updated today  Raynaud's syndrome without gangrene: Patient continues to have intermittent symptoms of Raynaud's phenomenon.  Mild cyanosis was noted in the left thumb and index fingertip.  No digital ulcerations noted.  No signs of sclerodactyly noted.  Primary osteoarthritis of both hands: CMC, PIP, DIP thickening consistent with osteoarthritis of both hands.  No synovitis noted on examination today.  S/P reverse total shoulder arthroplasty, left: Performed by Dr. Hiram Lukes on 10/08/2023.  No complications.  Currently go to physical therapy once a week and completing home exercises daily.  She continues to have some limitation and discomfort internal rotation but overall her pain level has improved since undergoing surgical intervention.  Spinal stenosis of cervical region: C-spine has limited range of motion with lateral rotation.  History of scoliosis: Scoliosis and kyphosis noted in the thoracic region.  Age-related osteoporosis without current pathological fracture - DEXA updated on 09/15/2021: Left radius BMD 0.537 with T score -2.6. Not a good candidate for oral bisphosphonates due to history of Barrett's esophagus.   Patient had an  updated bone density on 11/17/2023: 1/3 left distal radius T-score -3.3 and BMD 0.496.  -8% change.  Right total femur -18% change in BMD.  Left total femur -8% change in BMD.  Statistically significant decrease in both hips and the left radius. Discussed results today in detail.  All questions were addressed.  Different treatment options were discussed.  Discussed the option of IV Reclast, Prolia, Evenity, and Forteo/Tymlos.  She was given an Nurse, learning disability about Evenity and consent was obtained.  She would like to see the cost of Evenity prior to initiating therapy.  If Evenity will be costly she would like to proceed with IV Reclast.  Plan to obtain the following lab work today prior to initiating therapy. No upcoming dental work scheduled.   Plan: VITAMIN D 25 Hydroxy (Vit-D Deficiency, Fractures), Parathyroid hormone, intact (no Ca), Phosphorus, TSH, Serum protein electrophoresis with reflex  Vitamin D deficiency - Vitamin D level checked today. - Plan: VITAMIN D 25 Hydroxy (Vit-D Deficiency, Fractures)  Other medical conditions are listed as follows:   Chronic TMJ pain  History of hyperlipidemia: Lipid panel within normal limits on 09/27/2023.  History of gastroesophageal reflux (GERD)  History of diverticulitis  History of hypothyroidism  Other fatigue -Plan to obtain the following lab work today prior to initiating treatment for osteoporosis.  Plan: VITAMIN D 25 Hydroxy (Vit-D Deficiency, Fractures), Parathyroid hormone, intact (no Ca), Phosphorus, TSH, Serum protein electrophoresis with reflex  Orders: Orders Placed This Encounter  Procedures   VITAMIN D 25 Hydroxy (Vit-D Deficiency, Fractures)   Parathyroid hormone, intact (no Ca)   Phosphorus   TSH   Serum protein electrophoresis with reflex   No orders of the defined types were placed in this encounter.    Follow-Up Instructions: Return in about 5 months (around 06/06/2024) for Rheumatoid arthritis,  Osteoporosis.   Romayne Clubs, PA-C  Note - This record has been created using Dragon software.  Chart creation errors have been sought, but may not always  have been located. Such creation errors do not reflect on  the standard of  medical care.

## 2023-12-30 ENCOUNTER — Other Ambulatory Visit: Payer: Self-pay | Admitting: *Deleted

## 2023-12-30 DIAGNOSIS — Z79899 Other long term (current) drug therapy: Secondary | ICD-10-CM

## 2023-12-31 ENCOUNTER — Ambulatory Visit: Payer: Self-pay | Admitting: Rheumatology

## 2023-12-31 LAB — COMPREHENSIVE METABOLIC PANEL WITH GFR
AG Ratio: 1.7 (calc) (ref 1.0–2.5)
ALT: 17 U/L (ref 6–29)
AST: 19 U/L (ref 10–35)
Albumin: 4.3 g/dL (ref 3.6–5.1)
Alkaline phosphatase (APISO): 84 U/L (ref 37–153)
BUN: 23 mg/dL (ref 7–25)
CO2: 30 mmol/L (ref 20–32)
Calcium: 9.5 mg/dL (ref 8.6–10.4)
Chloride: 101 mmol/L (ref 98–110)
Creat: 0.63 mg/dL (ref 0.60–1.00)
Globulin: 2.5 g/dL (ref 1.9–3.7)
Glucose, Bld: 82 mg/dL (ref 65–99)
Potassium: 5 mmol/L (ref 3.5–5.3)
Sodium: 137 mmol/L (ref 135–146)
Total Bilirubin: 0.3 mg/dL (ref 0.2–1.2)
Total Protein: 6.8 g/dL (ref 6.1–8.1)
eGFR: 91 mL/min/{1.73_m2} (ref >=60)

## 2023-12-31 LAB — CBC WITH DIFFERENTIAL/PLATELET
Absolute Lymphocytes: 1466 {cells}/uL (ref 850–3900)
Absolute Monocytes: 433 {cells}/uL (ref 200–950)
Basophils Absolute: 31 {cells}/uL (ref 0–200)
Basophils Relative: 0.8 %
Eosinophils Absolute: 90 {cells}/uL (ref 15–500)
Eosinophils Relative: 2.3 %
HCT: 38.4 % (ref 35.0–45.0)
Hemoglobin: 12.8 g/dL (ref 11.7–15.5)
MCH: 31.4 pg (ref 27.0–33.0)
MCHC: 33.3 g/dL (ref 32.0–36.0)
MCV: 94.3 fL (ref 80.0–100.0)
MPV: 10.5 fL (ref 7.5–12.5)
Monocytes Relative: 11.1 %
Neutro Abs: 1880 {cells}/uL (ref 1500–7800)
Neutrophils Relative %: 48.2 %
Platelets: 233 10*3/uL (ref 140–400)
RBC: 4.07 10*6/uL (ref 3.80–5.10)
RDW: 11.7 % (ref 11.0–15.0)
Total Lymphocyte: 37.6 %
WBC: 3.9 10*3/uL (ref 3.8–10.8)

## 2023-12-31 NOTE — Progress Notes (Signed)
 CBC and CMP are normal.

## 2024-01-05 ENCOUNTER — Encounter: Payer: Self-pay | Admitting: Physician Assistant

## 2024-01-05 ENCOUNTER — Ambulatory Visit: Payer: Medicare HMO | Attending: Physician Assistant | Admitting: Physician Assistant

## 2024-01-05 VITALS — BP 108/70 | HR 73 | Resp 16 | Ht 60.25 in | Wt 118.2 lb

## 2024-01-05 DIAGNOSIS — M0579 Rheumatoid arthritis with rheumatoid factor of multiple sites without organ or systems involvement: Secondary | ICD-10-CM | POA: Diagnosis not present

## 2024-01-05 DIAGNOSIS — M19041 Primary osteoarthritis, right hand: Secondary | ICD-10-CM

## 2024-01-05 DIAGNOSIS — Z8739 Personal history of other diseases of the musculoskeletal system and connective tissue: Secondary | ICD-10-CM

## 2024-01-05 DIAGNOSIS — R5383 Other fatigue: Secondary | ICD-10-CM

## 2024-01-05 DIAGNOSIS — I7300 Raynaud's syndrome without gangrene: Secondary | ICD-10-CM | POA: Diagnosis not present

## 2024-01-05 DIAGNOSIS — M19042 Primary osteoarthritis, left hand: Secondary | ICD-10-CM

## 2024-01-05 DIAGNOSIS — G8929 Other chronic pain: Secondary | ICD-10-CM

## 2024-01-05 DIAGNOSIS — E559 Vitamin D deficiency, unspecified: Secondary | ICD-10-CM

## 2024-01-05 DIAGNOSIS — M3509 Sicca syndrome with other organ involvement: Secondary | ICD-10-CM

## 2024-01-05 DIAGNOSIS — Z79899 Other long term (current) drug therapy: Secondary | ICD-10-CM

## 2024-01-05 DIAGNOSIS — Z96612 Presence of left artificial shoulder joint: Secondary | ICD-10-CM

## 2024-01-05 DIAGNOSIS — M26629 Arthralgia of temporomandibular joint, unspecified side: Secondary | ICD-10-CM

## 2024-01-05 DIAGNOSIS — Z8639 Personal history of other endocrine, nutritional and metabolic disease: Secondary | ICD-10-CM

## 2024-01-05 DIAGNOSIS — M810 Age-related osteoporosis without current pathological fracture: Secondary | ICD-10-CM

## 2024-01-05 DIAGNOSIS — Z8719 Personal history of other diseases of the digestive system: Secondary | ICD-10-CM

## 2024-01-05 DIAGNOSIS — M4802 Spinal stenosis, cervical region: Secondary | ICD-10-CM

## 2024-01-05 NOTE — Patient Instructions (Signed)
 Zoledronic Acid Injection (Bone Disorders) What is this medication? ZOLEDRONIC ACID (ZOE le dron ik AS id) prevents and treats osteoporosis. It may also be used to treat Paget's disease of the bone. It works by Interior and spatial designer stronger and less likely to break (fracture). It belongs to a group of medications called bisphosphonates. This medicine may be used for other purposes; ask your health care provider or pharmacist if you have questions. COMMON BRAND NAME(S): Reclast What should I tell my care team before I take this medication? They need to know if you have any of these conditions: Bleeding disorder Cancer Dental disease Kidney disease Low levels of calcium in the blood Low red blood cell counts Lung or breathing disease, such as asthma Receiving steroids, such as dexamethasone  or prednisone An unusual or allergic reaction to zoledronic acid, other medications, foods, dyes, or preservatives Pregnant or trying to get pregnant Breast-feeding How should I use this medication? This medication is injected into a vein. It is given by your care team in a hospital or clinic setting. A special MedGuide will be given to you before each treatment. Be sure to read this information carefully each time. Talk to your care team about the use of this medication in children. Special care may be needed. Overdosage: If you think you have taken too much of this medicine contact a poison control center or emergency room at once. NOTE: This medicine is only for you. Do not share this medicine with others. What if I miss a dose? Keep appointments for follow-up doses. It is important not to miss your dose. Call your care team if you are unable to keep an appointment. What may interact with this medication? Certain antibiotics given by injection Medications for pain and inflammation, such as ibuprofen, naproxen, NSAIDs Some diuretics, such as bumetanide, furosemide Teriparatide This list may not  describe all possible interactions. Give your health care provider a list of all the medicines, herbs, non-prescription drugs, or dietary supplements you use. Also tell them if you smoke, drink alcohol , or use illegal drugs. Some items may interact with your medicine. What should I watch for while using this medication? Visit your care team for regular checks on your progress. It may be some time before you see the benefit from this medication. Some people who take this medication have severe bone, joint, or muscle pain. This medication may also increase your risk for jaw problems or a broken thigh bone. Tell your care team right away if you have severe pain in your jaw, bones, joints, or muscles. Tell your care team if you have any pain that does not go away or that gets worse. You should make sure you get enough calcium and vitamin D while you are taking this medication. Discuss the foods you eat and the vitamins you take with your care team. You may need bloodwork while taking this medication. Tell your dentist and dental surgeon that you are taking this medication. You should not have major dental surgery while on this medication. See your dentist to have a dental exam and fix any dental problems before starting this medication. Take good care of your teeth while on this medication. Make sure you see your dentist for regular follow-up appointments. What side effects may I notice from receiving this medication? Side effects that you should report to your care team as soon as possible: Allergic reactions--skin rash, itching, hives, swelling of the face, lips, tongue, or throat Kidney injury--decrease in the amount of urine,  swelling of the ankles, hands, or feet Low calcium level--muscle pain or cramps, confusion, tingling, or numbness in the hands or feet Osteonecrosis of the jaw--pain, swelling, or redness in the mouth, numbness of the jaw, poor healing after dental work, unusual discharge from the  mouth, visible bones in the mouth Severe bone, joint, or muscle pain Side effects that usually do not require medical attention (report to your care team if they continue or are bothersome): Diarrhea Dizziness Headache Nausea Stomach pain Vomiting This list may not describe all possible side effects. Call your doctor for medical advice about side effects. You may report side effects to FDA at 1-800-FDA-1088. Where should I keep my medication? This medication is given in a hospital or clinic. It will not be stored at home. NOTE: This sheet is a summary. It may not cover all possible information. If you have questions about this medicine, talk to your doctor, pharmacist, or health care provider.  2024 Elsevier/Gold Standard (2021-09-12 00:00:00) Romosozumab Injection What is this medication? ROMOSOZUMAB (roe moe SOZ ue mab) prevents and treats osteoporosis. It works by Interior and spatial designer stronger and less likely to break (fracture). It is a monoclonal antibody. This medicine may be used for other purposes; ask your health care provider or pharmacist if you have questions. COMMON BRAND NAME(S): EVENITY What should I tell my care team before I take this medication? They need to know if you have any of these conditions: Dental disease Heart attack Heart disease Kidney problems Low levels of calcium in the blood On dialysis Stroke Wear dentures An unusual or allergic reaction to romosozumab, other medications, foods, dyes or preservatives Pregnant or trying to get pregnant Breast-feeding How should I use this medication? This medication is injected under the skin. It is given by your care team in a hospital or clinic setting. A special MedGuide will be given to you by the pharmacist with each prescription and refill. Be sure to read this information carefully each time. Talk to your care team about the use of this medication in children. Special care may be needed. Overdosage: If you  think you have taken too much of this medicine contact a poison control center or emergency room at once. NOTE: This medicine is only for you. Do not share this medicine with others. What if I miss a dose? Keep appointments for follow-up doses. It is important not to miss your dose. Call your care team if you are unable to keep an appointment. What may interact with this medication? Interactions are not expected. This list may not describe all possible interactions. Give your health care provider a list of all the medicines, herbs, non-prescription drugs, or dietary supplements you use. Also tell them if you smoke, drink alcohol , or use illegal drugs. Some items may interact with your medicine. What should I watch for while using this medication? Your condition will be monitored carefully while you are receiving this medication. You may need bloodwork while taking this medication. You should make sure you get enough calcium and vitamin D while you are taking this medication. Discuss the foods you eat and the vitamins you take with your care team. Some people who take this medication have severe bone, joint, or muscle pain. This medication may also increase your risk for jaw problems or a broken thigh bone. Tell your care team right away if you have severe pain in your jaw, bones, joints, or muscles. Tell you care team if you have any pain that does not  go away or that gets worse. Tell your dentist and dental surgeon that you are taking this medication. You should not have major dental surgery while on this medication. See your dentist to have a dental exam and fix any dental problems before starting this medication. Take good care of your teeth while on this medication. Make sure you see your dentist for regular follow-up appointments. What side effects may I notice from receiving this medication? Side effects that you should report to your care team as soon as possible: Allergic reactions or  angioedema--skin rash, itching or hives, swelling of the face, eyes, lips, tongue, arms, or legs, trouble swallowing or breathing Heart attack--pain or tightness in the chest, shoulders, arms, or jaw, nausea, shortness of breath, cold or clammy skin, feeling faint or lightheaded Low calcium level--muscle pain or cramps, confusion, tingling, or numbness in the hands or feet Osteonecrosis of the jaw--pain, swelling, or redness in the mouth, numbness of the jaw, poor healing after dental work, unusual discharge from the mouth, visible bones in the mouth Severe bone, joint, or muscle pain Stroke--sudden numbness or weakness of the face, arm, or leg, trouble speaking, confusion, trouble walking, loss of balance or coordination, dizziness, severe headache, change in vision Side effects that usually do not require medical attention (report to your care team if they continue or are bothersome): Headache Joint pain Muscle spasms Pain, redness, or irritation at injection site Swelling of the ankles, hands, or feet This list may not describe all possible side effects. Call your doctor for medical advice about side effects. You may report side effects to FDA at 1-800-FDA-1088. Where should I keep my medication? This medication is given in a hospital or clinic. It will not be stored at home. NOTE: This sheet is a summary. It may not cover all possible information. If you have questions about this medicine, talk to your doctor, pharmacist, or health care provider.  2024 Elsevier/Gold Standard (2021-08-27 00:00:00)

## 2024-01-07 LAB — PROTEIN ELECTROPHORESIS, SERUM, WITH REFLEX
Albumin ELP: 4.2 g/dL (ref 3.8–4.8)
Alpha 1: 0.3 g/dL (ref 0.2–0.3)
Alpha 2: 0.7 g/dL (ref 0.5–0.9)
Beta 2: 0.3 g/dL (ref 0.2–0.5)
Beta Globulin: 0.4 g/dL (ref 0.4–0.6)
Gamma Globulin: 0.8 g/dL (ref 0.8–1.7)
Total Protein: 6.7 g/dL (ref 6.1–8.1)

## 2024-01-07 LAB — PHOSPHORUS: Phosphorus: 4.5 mg/dL — ABNORMAL HIGH (ref 2.1–4.3)

## 2024-01-07 LAB — PARATHYROID HORMONE, INTACT (NO CA): PTH: 31 pg/mL (ref 16–77)

## 2024-01-07 LAB — VITAMIN D 25 HYDROXY (VIT D DEFICIENCY, FRACTURES): Vit D, 25-Hydroxy: 50 ng/mL (ref 30–100)

## 2024-01-07 LAB — TSH: TSH: 1.34 m[IU]/L (ref 0.40–4.50)

## 2024-01-10 ENCOUNTER — Telehealth: Payer: Self-pay

## 2024-01-10 ENCOUNTER — Telehealth: Payer: Self-pay | Admitting: Pharmacist

## 2024-01-10 ENCOUNTER — Ambulatory Visit: Payer: Self-pay | Admitting: Physician Assistant

## 2024-01-10 ENCOUNTER — Other Ambulatory Visit: Payer: Self-pay | Admitting: Pharmacist

## 2024-01-10 DIAGNOSIS — M810 Age-related osteoporosis without current pathological fracture: Secondary | ICD-10-CM | POA: Insufficient documentation

## 2024-01-10 NOTE — Progress Notes (Signed)
 SPEP normal  PTH WNL Vitamin D  WNL TSH WNL PTH WNL  Phosphorus is borderline elevated-we will continue to monitor.   Ok to initiate therapy once approved by insurance and the patient is agreeable to proceed.

## 2024-01-10 NOTE — Telephone Encounter (Addendum)
 Patient appears to have a [facility] Medicare plan (not dual SNP). She will likely require pre-certification for Evenity and possibly Reclast.  [employer] will cover 80% of cost of medications and leave patient with 20% coinsurance. Because Evenity is brand-name only and a specialty medication, it is likely to be more expensive though a price a cannot be quotes for either Reclast or [employer] with patient regarding above. She is comfortable with moving forward with Reclast. Referral to [employer] placed today. Patient aware that infusion center scheduling team will reach out once ready   Geraldene Kleine, PharmD, MPH, BCPS, CPP Clinical Pharmacist (Rheumatology and Pulmonology)  ----- Message from [facility] Jerrold Morgan V sent at 01/05/2024  3:02 PM EDT ----- Please apply for Evenity, if to expensive apply for Reclast.   Consent signed and sent to scan center.  Geraldene Kleine, PharmD, MPH, BCPS, CPP Clinical Pharmacist (Rheumatology and Pulmonology)

## 2024-01-10 NOTE — Progress Notes (Signed)
 Therapy plan placed for Reclast IV [phone]) for [facility] to start benefits investigation  Diagnosis: age-related osteoporosis  Provider: Dr. Nicholas Bari and Jacinta Martinis, PA-C  Dose: 5mg  IV every 12 months  Last Clinic Visit: 01/05/24 Next Clinic Visit: 06/07/24  Pertinent Labs: 01/05/24 - Vitamin D , CMP, TSH, phosphorous stable  Geraldene Kleine, PharmD, MPH, BCPS, CPP Clinical Pharmacist (Rheumatology and Pulmonology)

## 2024-01-10 NOTE — Telephone Encounter (Signed)
 Auth Submission: NO AUTH NEEDED Site of care: Site of care: [facility] Payer: [employer] medicare Medication & CPT/J Code(s) submitted: Reclast (Zolendronic acid) [id number] Route of submission (phone, fax, portal):  Phone # Fax # Auth type: Buy/Bill PB Units/visits requested: 5mg  x 1 dose Reference number:  Approval from: 01/10/24 to 08/09/24

## 2024-01-13 NOTE — Progress Notes (Signed)
 Reclast scheduled for 01/21/24

## 2024-01-19 NOTE — Progress Notes (Signed)
 [facility] Return Visit   Referring Provider Lory Rough., PA-C [address],  [place] [address]  Primary Care Provider Lory Rough., PA-C  Patient Profile: Linda Berg is a 78 y.o. female with a past medical history noteworthy for dyslipidemia, hearing loss, hypothyroidism who returns to the [facility] for follow-up of the problem(s) noted below.  Problem List: GERD History of short segement nondysplastic Barrett's esophagus History of hiatal hernia Esophageal dysmotility on barium esophagram 07/2023 Chronic constipation Colonic diverticulosis Colonoscopy complicated by rectosigmoid perforation in 2019 treated with clips   History of Present Illness   Linda Berg was last seen in the GI office 09/08/2023 by Reginal Capra, PA   Current GI Meds  Nexium  20 mg p.o. twice daily OTC Metamucil MiraLAX   Interval History   GERD, esophageal dysmotility -- Currently on Nexium  20 mg p.o. 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  Chronic constipation -- Using MiraLAX  one half cap every other day and Metamucil -on this regimen will pass the stool daily or every other day -- States Linzess was not beneficial in the past -was on 145 mcg daily and developed diarrhea -- Has trialed a stool softener with stimulant and possibly Dulcolax without effect -- Generally satisfied with current regimen and is not necessarily seeking to make changes --  Does consume hot tea and we discussed the use of smooth move tea and/or senna as a natural laxative -- No blood or mucus in stool -- Last colonoscopy 2019 complicated by perforation -procedure was complete to the cecum -no repeat planned at this time given age and benign findings on last colonoscopy  Last colonoscopy: 08/2017 -diverticula in sigmoid colon, mucosal tear in the rectosigmoid colon 20 cm from anal verge treated with clips Last endoscopy: 08/2017 -irregular Z-line, no endoscopic abnormality to explain dysphagia, normal stomach and duodenum  Last Abd CT/CTE/MRE:  None recent  GI Review of Symptoms Significant for Rare GERD. Otherwise negative.  General Review of Systems  Review of systems is significant for the pertinent positives and negatives as listed per the HPI.  Full ROS is otherwise negative.  Past Medical History   Past Medical History:  Diagnosis Date   Barrett esophagus    Diverticulosis    Dyslipidemia    Facial numbness    GERD (gastroesophageal reflux disease)    Hearing loss    Hiatal hernia    Hypothyroidism    Rheumatoid arteritis (HCC)    Rotator cuff tear, left      Past Surgical History   Past Surgical History:  Procedure Laterality Date   CATARACT EXTRACTION, BILATERAL     04/13/2019, 04/27/2019   CHEST TUBE INSERTION  08/17/2017   COLONOSCOPY  08/17/2017   HAMMER TOE SURGERY Left    INTRAUTERINE DEVICE INSERTION     IUD REMOVAL     KNEE ARTHROPLASTY     KNEE ARTHROSCOPY  12/25/11   left   LEFT OOPHORECTOMY  03/13/98   MANDIBLE SURGERY  9/93   pallete expansion   REVERSE SHOULDER ARTHROPLASTY Left 10/08/2023   Procedure: REVERSE SHOULDER ARTHROPLASTY;  Surgeon: Janeth Medicus, MD;  Location: [facility];  Service: Orthopedics;  Laterality: Left;   TONSILLECTOMY     TUBAL LIGATION       Allergies and Medications   Allergies  Allergen Reactions   Ciprofloxacin Anaphylaxis    Facial numbness   Prednisone Other (See Comments)     Stomach spasms   Terbinafine Rash   Lamisil At [Powders]    Current Meds  Medication Sig   AMBULATORY NON FORMULARY MEDICATION ALLERGY SHOTS Once a week   aspirin  81 MG tablet Take 81 mg by mouth daily.    Biotin 5000 MCG TABS Take 5,000 mcg by mouth daily.   cholecalciferol (VITAMIN D ) 1000 units tablet Take 1,000 Units by mouth daily.   cyanocobalamin (VITAMIN B12) 1000 MCG tablet Take 1,000 mcg by mouth daily.   cyclobenzaprine  (FLEXERIL ) 5 MG tablet Take 5 mg by mouth as needed for muscle spasms (TMJ).   cycloSPORINE (RESTASIS) 0.05 % ophthalmic emulsion Place 1 drop into both eyes 2 (two) times daily.    EPINEPHrine  0.3 mg/0.3 mL IJ SOAJ injection Inject 0.3 mg into the muscle as needed for anaphylaxis.   esomeprazole  (NEXIUM ) 20 MG capsule Take 20 mg by mouth 2 (two) times daily before a meal.   fexofenadine (ALLEGRA) 180 MG tablet Take 180 mg by mouth daily.   fluticasone (FLONASE) 50 MCG/ACT nasal spray Place 1 spray into both nostrils daily as needed for allergies.   hydroxychloroquine  (PLAQUENIL ) 200 MG tablet TAKE 1 TABLET BY MOUTH DAILY   Krill Oil 500 MG CAPS Take 500 mg by mouth daily.   lidocaine  (LIDODERM ) 5 % as needed.   lovastatin (MEVACOR) 20 MG tablet Take 20 mg by mouth daily.   Magnesium  250 MG TABS Take 250 mg by mouth daily.   Peppermint Oil (IBGARD) 90 MG CPCR Take 1 capsule by mouth as needed.   polyethylene glycol (MIRALAX  / GLYCOLAX ) 17 g packet Take 8.5 g by mouth as needed for moderate constipation. 1/2 capful daily   Probiotic Product (PROBIOTIC DAILY PO) Take 1 tablet by mouth daily.    thyroid  (ARMOUR) 60 MG tablet Take 60 mg by mouth daily before breakfast.   vitamin C (ASCORBIC ACID) 500 MG tablet Take 1,000 mg by mouth 2 (two) times daily.    Zinc 30 MG CAPS Take 30 mg by mouth daily.     Family His   Family History  Problem Relation Age of Onset   Heart disease Mother    Rheumatic fever Mother    Heart failure Mother    Stroke Father     Diabetes Father    Heart failure Father    Heart disease Maternal Aunt        aunt x 2   Irritable bowel syndrome Son    GER disease Son    Colon cancer Neg Hx     Social History   Social History   Tobacco Use   Smoking status: Never    Passive exposure: Past   Smokeless tobacco: Never  Vaping Use   Vaping status: Never Used  Substance Use Topics   Alcohol  use: No    Alcohol /week: 0.0 standard drinks of alcohol    Drug use: No   Linda Berg reports that she has never smoked. She has been exposed to tobacco smoke. She has never used smokeless tobacco. She reports that she does not drink alcohol  and does not use drugs.  Vital Signs and Physical Examination   Vitals:   01/20/24  1457  BP: 110/60  Pulse: 79   Body mass index is 22.85 kg/m. Weight: 118 lb (53.5 kg)  General: Well developed, well nourished, no acute distress Head: Normocephalic and atraumatic Eyes: Sclerae anicteric, EOMI Ears: Normal auditory acuity Mouth: No deformities or lesions noted Lungs: Clear throughout to auscultation Heart: Regular rate and rhythm; No murmurs, rubs or bruits Abdomen: Soft, non tender and non distended. No masses, hepatosplenomegaly or hernias noted. Normal Bowel sounds Rectal: Musculoskeletal: Symmetrical with no gross deformities  Pulses:  Normal pulses noted Extremities: No edema or deformities noted Neurological: Alert oriented x 4, grossly nonfocal Psychological:  Alert and cooperative. Normal mood and affect   Review of Data   The following data was reviewed at the time of this encounter:   Laboratory Studies      Latest Ref Rng & Units 12/30/2023    2:01 PM 09/27/2023    1:11 PM 08/05/2023    2:55 PM  CBC  WBC 3.8 - 10.8 Thousand/uL 3.9  5.7  5.9   Hemoglobin 11.7 - 15.5 g/dL 40.9  81.1  91.4   Hematocrit 35.0 - 45.0 % 38.4  40.2  40.6   Platelets 140 - 400 Thousand/uL 233  244  281     No results found for: LIPASE    Latest Ref Rng & Units 01/05/2024     3:06 PM 12/30/2023    2:01 PM 09/27/2023    1:11 PM  CMP  Glucose 65 - 99 mg/dL  82  97   BUN 7 - 25 mg/dL  23  16   Creatinine 7.82 - 1.00 mg/dL  9.56  2.13   Sodium 086 - 146 mmol/L  137  136   Potassium 3.5 - 5.3 mmol/L  5.0  4.5   Chloride 98 - 110 mmol/L  101  100   CO2 20 - 32 mmol/L  30  26   Calcium 8.6 - 10.4 mg/dL  9.5  9.1   Total Protein 6.1 - 8.1 g/dL 6.7  6.8    Total Bilirubin 0.2 - 1.2 mg/dL  0.3    AST 10 - 35 U/L  19    ALT 6 - 29 U/L  17       Imaging Studies  Barium esophagram 07/27/2023 Moderate esophageal dysmotility, likely presbyesophagus. Gastroesophageal reflux with water  swallowing.    GI Procedures and Studies  EGD/Colonoscopy 08/2017 EGD - irregular Z-line, no endoscopic abnormality to explain dysphagia, normal stomach and duodenum Colonoscopy - diverticula in sigmoid colon, mucosal tear in the rectosigmoid colon 20 cm from anal verge treated with clips  Path: Focal Barrett's esophagus  EGD/Colonoscopy 04/2007 EGD - SSBE Colonoscopy - Normal, no polyps   Clinical Impression  It is my clinical impression that Linda Berg is a 78 y.o. female with;  GERD History of short segement nondysplastic Barrett's esophagus History of hiatal hernia Esophageal dysmotility on barium esophagram 07/2023 Chronic constipation Colonic diverticulosis Colonoscopy complicated by rectosigmoid perforation in 2019 treated with clips  Linda Berg returns to the office today for follow-up of a history of GERD, esophageal dysmotility, hiatal hernia and short segment nondysplastic Barrett's esophagus.  Her GERD is currently well-controlled on Nexium  20 mg p.o. twice daily.  She is careful to avoid food triggers associated with GERD.  From a dysphagia perspective she is not having current symptoms and notes that she eats her food slowly and take small bites.  Her last EGD in 2019 disclosed evidence of focal Barrett's esophagus.  She is technically  overdue for surveillance.  At  today's visit we discussed coordinating a follow-up procedure.  Noted that her last EGD/colonoscopy was complicated by colon perforation.  She is understandably nervous about undergoing another procedure.  Her EGD was uncomplicated and I do not anticipate significant issues but we will schedule her procedure at the hospital given her prior history and age..  Constipation symptoms are overall stable on a combination of MiraLAX  and Metamucil.  Based on our discussion today, sometimes her bowel movements are irregular and she may benefit from a stimulant laxative.  Linzess resulted in diarrhea.  She does consume hot tea and I suggested a trial of Smooth Move tea and or the addition of senna to her current regimen.  Her last colonoscopy was complicated by rectosigmoid perforation.  The procedure was complete to the cecum and no polyps were found.  Given her previous complication, benign findings on last colonoscopy and age no further surveillance/screening colonoscopies are planned.   Plan  Schedule EGD at hospital for surveillance of short segment nondysplastic Barrett's esophagus Continue Nexium  20 mg p.o. twice daily OTC GERD precautions and lifestyle modifications Continue MiraLAX  one half cap every other day in conjunction with Metamucil Provided information regarding Smooth Move tea and senna No further screening/surveillance colonoscopies planned   Planned Follow Up 3 months  The patient or caregiver verbalized understanding of the material covered, with no barriers to understanding. All questions were answered. Patient or caregiver is agreeable with the plan outlined above.    It was a pleasure to see Linda Berg.  If you have any questions or concerns regarding this evaluation, do not hesitate to contact me.  Eugenia Hess, MD [facility]

## 2024-01-20 ENCOUNTER — Ambulatory Visit: Admitting: Pediatrics

## 2024-01-20 ENCOUNTER — Encounter: Payer: Self-pay | Admitting: Pediatrics

## 2024-01-20 VITALS — BP 110/60 | HR 79 | Ht 60.25 in | Wt 118.0 lb

## 2024-01-20 DIAGNOSIS — K224 Dyskinesia of esophagus: Secondary | ICD-10-CM

## 2024-01-20 DIAGNOSIS — K2270 Barrett's esophagus without dysplasia: Secondary | ICD-10-CM | POA: Diagnosis not present

## 2024-01-20 DIAGNOSIS — K5900 Constipation, unspecified: Secondary | ICD-10-CM | POA: Diagnosis not present

## 2024-01-20 DIAGNOSIS — K219 Gastro-esophageal reflux disease without esophagitis: Secondary | ICD-10-CM | POA: Diagnosis not present

## 2024-01-20 NOTE — Patient Instructions (Signed)
 Trial Smooth Move Tea or Senna to help with constipation.  Follow up in 3 months.  You have been scheduled for an endoscopy. Please follow written instructions given to you at your visit today.  If you use inhalers (even only as needed), please bring them with you on the day of your procedure.  If you take any of the following medications, they will need to be adjusted prior to your procedure:   DO NOT TAKE 7 DAYS PRIOR TO TEST- Trulicity (dulaglutide) Ozempic, Wegovy (semaglutide) Mounjaro (tirzepatide) Bydureon Bcise (exanatide extended release)  DO NOT TAKE 1 DAY PRIOR TO YOUR TEST Rybelsus (semaglutide) Adlyxin (lixisenatide) Victoza (liraglutide) Byetta (exanatide) ___________________________________________________________________________  Thank you for entrusting me with your care and for choosing [facility], Dr. Eugenia Hess   _______________________________________________________  If your blood pressure at your visit was 140/90 or greater, please contact your primary care physician to follow up on this.  _______________________________________________________  If you are age 78 or older, your body mass index should be between 23-30. Your Body mass index is 22.85 kg/m. If this is out of the aforementioned range listed, please consider follow up with your Primary Care Provider.  If you are age 74 or younger, your body mass index should be between 19-25. Your Body mass index is 22.85 kg/m. If this is out of the aformentioned range listed, please consider follow up with your Primary Care Provider.   ________________________________________________________  The [facility] providers would like to encourage you to use MYCHART to communicate with providers for non-urgent requests or questions.  Due to long hold times on the telephone, sending your provider a message by [facility] may be a faster and more efficient way to get a response.  Please allow 48 business hours  for a response.  Please remember that this is for non-urgent requests.  _______________________________________________________

## 2024-01-21 ENCOUNTER — Ambulatory Visit (INDEPENDENT_AMBULATORY_CARE_PROVIDER_SITE_OTHER): Admitting: *Deleted

## 2024-01-21 VITALS — BP 105/66 | HR 65 | Temp 97.4°F | Resp 16 | Ht 60.0 in | Wt 118.4 lb

## 2024-01-21 DIAGNOSIS — M810 Age-related osteoporosis without current pathological fracture: Secondary | ICD-10-CM | POA: Diagnosis not present

## 2024-01-21 MED ORDER — DIPHENHYDRAMINE HCL 25 MG PO CAPS: 25.0000 mg | ORAL_CAPSULE | Freq: Once | ORAL | Status: DC

## 2024-01-21 MED ORDER — ACETAMINOPHEN 325 MG PO TABS: 650.0000 mg | ORAL_TABLET | Freq: Once | ORAL | Status: DC

## 2024-01-21 MED ORDER — ZOLEDRONIC ACID 5 MG/100ML IV SOLN
5.0000 mg | Freq: Once | INTRAVENOUS | Status: AC
  Administered 2024-01-21: 5 mg via INTRAVENOUS
  Filled 2024-01-21: qty 100

## 2024-01-21 NOTE — Progress Notes (Signed)
 Diagnosis: Osteoporosis  Provider:  Mannam, Praveen MD  Procedure: IV Infusion  IV Type: Peripheral, IV Location: L Forearm  Reclast (Zolendronic Acid), Dose: 5 mg  Infusion Start Time: 1424 pm  Infusion Stop Time: 1458 pm  Post Infusion IV Care: Observation period completed and Peripheral IV Discontinued  Discharge: Condition: Good, Destination: Home . AVS Provided  Performed by:  Mayme Spearman, RN

## 2024-02-02 ENCOUNTER — Telehealth: Payer: Self-pay | Admitting: Gastroenterology

## 2024-02-02 NOTE — Telephone Encounter (Signed)
 Procedure:Endoscopy Procedure date: 02/10/24 Procedure location: [facility] Arrival Time: 9:45 am Spoke with the patient Y/N: Yes Any prep concerns? No  Has the patient obtained the prep from the pharmacy ? No prep needed Do you have a care partner and transportation: Yes Any additional concerns? No

## 2024-02-04 ENCOUNTER — Encounter (HOSPITAL_COMMUNITY): Payer: Self-pay | Admitting: Pediatrics

## 2024-02-04 NOTE — Progress Notes (Signed)
 Linda Berg   PCP-Kaplan PA Cardiologist-Patwardhan  Pulmonologist- n/a saw in 2019 Wert MD  EKG-09/27/23 Echo-2015 Cath-n/a Stress-n/a ICD/PM-PM GLP1-n/a Blood Thinner- n/a  HX: Dyslipidemia, hearing loss, hypothyroidism, esophageal dysmotility, constipation. Last saw cardiology in feb, where they gave her clearance for shoulder surgery. She reports no cardiac or breathing issues since then. Of note last colon/egd in 2019 did have some complications. There was a colon perforation, and in recovery she developed some facial edema, there was concern for allergic reaction to propofol . At this time, was transferred to hospital intubated, then develop some resp failure and pneumothorax. At time of d/c this is what they noted  -Initially anaphylaxis to propofol  was suspected, however she had no vocal cord edema on intubation or other signs of anaphylaxis -It is possible she had a microperforation, she had free intraperitoneal air on admission CT scan, which resulted in all of the above, no obvious perforated viscus noted   Anesthesia Review- Yes- anesthesia said propofol  for last procedure went fine so okay to proceed

## 2024-02-10 ENCOUNTER — Ambulatory Visit (HOSPITAL_COMMUNITY): Admitting: Anesthesiology

## 2024-02-10 ENCOUNTER — Ambulatory Visit (HOSPITAL_COMMUNITY)
Admission: RE | Admit: 2024-02-10 | Discharge: 2024-02-10 | Disposition: A | Discharge status: Home / Self Care | Attending: Pediatrics | Admitting: Pediatrics

## 2024-02-10 ENCOUNTER — Encounter (HOSPITAL_COMMUNITY)
Admission: RE | Disposition: A | Discharge status: Home / Self Care | Payer: Self-pay | Source: Home / Self Care | Attending: Pediatrics

## 2024-02-10 ENCOUNTER — Other Ambulatory Visit: Payer: Self-pay | Admitting: Physician Assistant

## 2024-02-10 ENCOUNTER — Encounter (HOSPITAL_COMMUNITY): Payer: Self-pay | Admitting: Pediatrics

## 2024-02-10 DIAGNOSIS — E785 Hyperlipidemia, unspecified: Secondary | ICD-10-CM | POA: Diagnosis not present

## 2024-02-10 DIAGNOSIS — M069 Rheumatoid arthritis, unspecified: Secondary | ICD-10-CM | POA: Diagnosis not present

## 2024-02-10 DIAGNOSIS — I4719 Other supraventricular tachycardia: Secondary | ICD-10-CM | POA: Insufficient documentation

## 2024-02-10 DIAGNOSIS — K219 Gastro-esophageal reflux disease without esophagitis: Secondary | ICD-10-CM

## 2024-02-10 DIAGNOSIS — I340 Nonrheumatic mitral (valve) insufficiency: Secondary | ICD-10-CM | POA: Insufficient documentation

## 2024-02-10 DIAGNOSIS — E039 Hypothyroidism, unspecified: Secondary | ICD-10-CM | POA: Diagnosis not present

## 2024-02-10 DIAGNOSIS — K2270 Barrett's esophagus without dysplasia: Secondary | ICD-10-CM | POA: Diagnosis not present

## 2024-02-10 DIAGNOSIS — Z79899 Other long term (current) drug therapy: Secondary | ICD-10-CM | POA: Insufficient documentation

## 2024-02-10 DIAGNOSIS — M0579 Rheumatoid arthritis with rheumatoid factor of multiple sites without organ or systems involvement: Secondary | ICD-10-CM

## 2024-02-10 DIAGNOSIS — K2100 Gastro-esophageal reflux disease with esophagitis, without bleeding: Secondary | ICD-10-CM | POA: Diagnosis not present

## 2024-02-10 HISTORY — PX: ESOPHAGOGASTRODUODENOSCOPY: SHX5428

## 2024-02-10 SURGERY — EGD (ESOPHAGOGASTRODUODENOSCOPY)
Anesthesia: Monitor Anesthesia Care

## 2024-02-10 MED ORDER — PROPOFOL 500 MG/50ML IV EMUL
INTRAVENOUS | Status: AC
  Filled 2024-02-10: qty 50

## 2024-02-10 MED ORDER — SODIUM CHLORIDE 0.9 % IV SOLN: INTRAVENOUS | Status: DC

## 2024-02-10 MED ORDER — PROPOFOL 500 MG/50ML IV EMUL
INTRAVENOUS | Status: AC
Start: 2024-02-10 — End: 2024-02-10
  Filled 2024-02-10: qty 50

## 2024-02-10 MED ORDER — LIDOCAINE HCL (CARDIAC) PF 100 MG/5ML IV SOSY
PREFILLED_SYRINGE | INTRAVENOUS | Status: DC | PRN
  Administered 2024-02-10: 100 mg via INTRAVENOUS

## 2024-02-10 MED ORDER — PROPOFOL 500 MG/50ML IV EMUL
INTRAVENOUS | Status: DC | PRN
  Administered 2024-02-10: 100 ug/kg/min via INTRAVENOUS
  Administered 2024-02-10 (×2): 50 mg via INTRAVENOUS

## 2024-02-10 MED ORDER — PROPOFOL 10 MG/ML IV BOLUS
INTRAVENOUS | Status: AC
  Filled 2024-02-10: qty 20

## 2024-02-10 NOTE — Transfer of Care (Signed)
 Immediate Anesthesia Transfer of Care Note  Patient: Linda Berg  Procedure(s) Performed: EGD (ESOPHAGOGASTRODUODENOSCOPY)  Patient Location: PACU and Endoscopy Unit  Anesthesia Type:MAC  Level of Consciousness: drowsy, patient cooperative, and responds to stimulation  Airway & Oxygen Therapy: Patient Spontanous Breathing  Post-op Assessment: Report given to RN and Post -op Vital signs reviewed and stable  Post vital signs: Reviewed and stable  Last Vitals:  Vitals Value Taken Time  BP 92/56 02/10/24 11:37  Temp    Pulse 69 02/10/24 11:37  Resp 27 02/10/24 11:37  SpO2 96 % 02/10/24 11:37  Vitals shown include unfiled device data.  Last Pain:  Vitals:   02/10/24 1013  TempSrc: Temporal  PainSc: 0-No pain      Patients Stated Pain Goal: 0 (02/10/24 1013)  Complications: No notable events documented.

## 2024-02-10 NOTE — Anesthesia Preprocedure Evaluation (Addendum)
 Anesthesia Evaluation  Patient identified by MRN, date of birth, ID band Patient awake    Reviewed: Allergy & Precautions, NPO status , Patient's Chart, lab work & pertinent test results  History of Anesthesia Complications Negative for: history of anesthetic complications  Airway Mallampati: III  TM Distance: >3 FB Neck ROM: Full    Dental  (+) Dental Advisory Given   Pulmonary neg shortness of breath, neg sleep apnea, neg COPD, neg recent URI H/o traumatic pneumothorax   Pulmonary exam normal breath sounds clear to auscultation       Cardiovascular (-) hypertension(-) angina (-) Past MI, (-) Cardiac Stents and (-) CABG + dysrhythmias (paroxysmal atrial tachycardia) + Valvular Problems/Murmurs (mild MR)  Rhythm:Regular Rate:Normal  HLD  Echocardiogram 09/24/2022:  Normal LV systolic function with visual EF 60-65%. Left ventricle cavity  is normal in size. Normal left ventricular wall thickness. Normal global  wall motion. Indeterminate diastolic filling pattern. Calculated EF 68%.  Structurally normal mitral valve.  Mild (Grade I) mitral regurgitation.  Structurally normal tricuspid valve with trace regurgitation. No evidence  of pulmonary hypertension.     Neuro/Psych  Headaches, neg Seizures    GI/Hepatic Neg liver ROS, hiatal hernia,GERD (Barrett's esophagus)  Medicated,,Diverticulosis    Endo/Other  neg diabetesHypothyroidism    Renal/GU negative Renal ROS     Musculoskeletal  (+) Arthritis  (on Plaquenil ), Osteoarthritis and Rheumatoid disorders,  Scoliosis    Abdominal   Peds  Hematology negative hematology ROS (+) Lab Results      Component                Value               Date                      WBC                      5.7                 09/27/2023                HGB                      13.1                09/27/2023                HCT                      40.2                09/27/2023                 MCV                      99.0                09/27/2023                PLT                      244                 09/27/2023              Anesthesia Other Findings   Reproductive/Obstetrics  Anesthesia Physical Anesthesia Plan  ASA: 3  Anesthesia Plan: MAC   Post-op Pain Management:    Induction:   PONV Risk Score and Plan: 3 and Treatment may vary due to age or medical condition and Propofol  infusion  Airway Management Planned: Natural Airway and Nasal Cannula  Additional Equipment:   Intra-op Plan:   Post-operative Plan:   Informed Consent: I have reviewed the patients History and Physical, chart, labs and discussed the procedure including the risks, benefits and alternatives for the proposed anesthesia with the patient or authorized representative who has indicated his/her understanding and acceptance.     Dental advisory given  Plan Discussed with: CRNA and Anesthesiologist  Anesthesia Plan Comments: ( )        Anesthesia Quick Evaluation

## 2024-02-10 NOTE — Discharge Instructions (Signed)

## 2024-02-10 NOTE — H&P (Signed)
 [facility] History and Physical   Primary Care Physician:  Debrah Josette ORN., PA-C   Reason for Procedure:  GERD, history of Barrett's esophagus  Plan:    Upper endoscopy     HPI: Linda Berg is a 78 y.o. female undergoing upper endoscopy for investigation of GERD and history of Barrett's esophagus.  Currently on Nexium  20 mg p.o. twice daily.  Last EGD in 2019 showed focal Barrett's esophagus with repeat follow-up recommended in 5 years.  Patient is now due for this study.  Denies dysphagia or uncontrolled GERD symptoms.  No family history of esophageal cancer.   Past Medical History:  Diagnosis Date   Barrett esophagus    Diverticulosis    Dyslipidemia    Facial numbness    GERD (gastroesophageal reflux disease)    Hearing loss    Hiatal hernia    Hypothyroidism    Rheumatoid arteritis (HCC)    Rotator cuff tear, left     Past Surgical History:  Procedure Laterality Date   CATARACT EXTRACTION, BILATERAL     04/13/2019, 04/27/2019   CHEST TUBE INSERTION  08/17/2017   COLONOSCOPY  08/17/2017   HAMMER TOE SURGERY Left    INTRAUTERINE DEVICE INSERTION     IUD REMOVAL     KNEE ARTHROPLASTY     KNEE ARTHROSCOPY  12/25/11   left   LEFT OOPHORECTOMY  03/13/98   MANDIBLE SURGERY  9/93   pallete expansion   REVERSE SHOULDER ARTHROPLASTY Left 10/08/2023   Procedure: REVERSE SHOULDER ARTHROPLASTY;  Surgeon: Sharl Selinda Dover, MD;  Location: [facility];  Service: Orthopedics;  Laterality: Left;   TONSILLECTOMY     TUBAL LIGATION      Prior to Admission medications   Medication Sig Start Date End Date Taking? Authorizing Provider  AMBULATORY NON FORMULARY MEDICATION ALLERGY SHOTS Once a week   Yes [provider]  aspirin  81 MG tablet Take 81 mg by mouth daily.    Yes [provider]  cholecalciferol (VITAMIN D ) 1000 units tablet Take 1,000 Units by mouth daily.   Yes [provider]  cyanocobalamin (VITAMIN B12) 1000  MCG tablet Take 1,000 mcg by mouth daily.   Yes [provider]  cyclobenzaprine  (FLEXERIL ) 5 MG tablet Take 5 mg by mouth as needed for muscle spasms (TMJ). 09/23/22  Yes [provider]  esomeprazole  (NEXIUM ) 20 MG capsule Take 20 mg by mouth 2 (two) times daily before a meal.   Yes [provider]  fexofenadine (ALLEGRA) 180 MG tablet Take 180 mg by mouth daily.   Yes [provider]  fluticasone (FLONASE) 50 MCG/ACT nasal spray Place 1 spray into both nostrils daily as needed for allergies.   Yes [provider]  hydroxychloroquine  (PLAQUENIL ) 200 MG tablet TAKE 1 TABLET BY MOUTH DAILY 02/10/24  Yes Cheryl Waddell HERO, PA-C  Krill Oil 500 MG CAPS Take 500 mg by mouth daily.   Yes [provider]  lidocaine  (LIDODERM ) 5 % as needed.   Yes [provider]  lovastatin (MEVACOR) 20 MG tablet Take 20 mg by mouth daily.   Yes [provider]  Magnesium  250 MG TABS Take 250 mg by mouth daily.   Yes [provider]  Peppermint Oil (IBGARD) 90 MG CPCR Take 1 capsule by mouth as needed.   Yes [provider]  polyethylene glycol (MIRALAX  / GLYCOLAX ) 17 g packet Take 8.5 g by mouth as needed for moderate constipation. 1/2 capful daily   Yes  [provider]  Probiotic Product (PROBIOTIC DAILY PO) Take 1 tablet by mouth daily.    Yes [provider]  thyroid  (ARMOUR) 60 MG tablet Take 60 mg by mouth daily before breakfast.   Yes [provider]  vitamin C (ASCORBIC ACID) 500 MG tablet Take 1,000 mg by mouth 2 (two) times daily.    Yes [provider]  Zinc 30 MG CAPS Take 30 mg by mouth daily.   Yes [provider]  Biotin 5000 MCG TABS Take 5,000 mcg by mouth daily.    [provider]  cycloSPORINE (RESTASIS) 0.05 % ophthalmic emulsion Place 1 drop into both eyes 2 (two) times daily.     [provider]  EPINEPHrine  0.3 mg/0.3 mL IJ SOAJ injection Inject 0.3 mg  into the muscle as needed for anaphylaxis. 04/29/17   [provider]  ondansetron  (ZOFRAN ) 4 MG tablet Take 1 tablet (4 mg total) by mouth every 8 (eight) hours as needed for vomiting or nausea. 10/08/23   Sharl Selinda Dover, MD  oxyCODONE  (ROXICODONE ) 5 MG immediate release tablet Take 1 tablet (5 mg total) by mouth every 8 (eight) hours as needed. 10/08/23 10/07/24  Sharl Selinda Dover, MD    Current Facility-Administered Medications  Medication Dose Route Frequency Provider Last Rate Last Admin   0.9 %  sodium chloride  infusion   Intravenous Continuous Suzann Inocente HERO, MD 20 mL/hr at 02/10/24 1014 New Bag at 02/10/24 1014    Allergies as of 01/20/2024 - Review Complete 01/20/2024  Allergen Reaction Noted   Ciprofloxacin Anaphylaxis 08/17/2017   Prednisone Other (See Comments) 01/08/2009   Terbinafine Rash 09/16/2013   Lamisil at [powders]  10/06/2019    Family History  Problem Relation Age of Onset   Heart disease Mother    Rheumatic fever Mother    Heart failure Mother    Stroke Father    Diabetes Father    Heart failure Father    Heart disease Maternal Aunt        aunt x 2   Irritable bowel syndrome Son    GER disease Son    Colon cancer Neg Hx     Social History   Socioeconomic History   Marital status: Married    Spouse name: Not on file   Number of children: 2   Years of education: 12   Highest education level: High school graduate  Occupational History   Occupation: retired    Associate Professor: RETIRED  Tobacco Use   Smoking status: Never    Passive exposure: Past   Smokeless tobacco: Never  Vaping Use   Vaping status: Never Used  Substance and Sexual Activity   Alcohol  use: No    Alcohol /week: 0.0 standard drinks of alcohol    Drug use: No   Sexual activity: Not on file  Other Topics Concern   Not on file  Social History Narrative   Live with husband at home.   Left-handed.   1-2 cups per day.   Social Drivers of Health   Financial Resource  Strain: Low Risk  (08/30/2021)   Received from [facility] visits prior to 10/10/2022.   Overall Financial Resource Strain (CARDIA)    Difficulty of Paying Living Expenses: Not hard at all  Food Insecurity: Low Risk  (09/18/2023)   Received from [facility]   Hunger Vital Sign    Within the past 12 months, you worried that your food would run out before you got money to  buy more: Never true    Within the past 12 months, the food you bought just didn't last and you didn't have money to get more. : Never true  Transportation Needs: No Transportation Needs (09/18/2023)   Received from [facility]    In the past 12 months, has lack of reliable transportation kept you from medical appointments, meetings, work or from getting things needed for daily living? : No  Physical Activity: Insufficiently Active (08/30/2021)   Received from [facility] visits prior to 10/10/2022.   Exercise Vital Sign    Days of Exercise per Week: 1 day    Minutes of Exercise per Session: 30 min  Stress: No Stress Concern Present (08/30/2021)   Received [facility] visits prior to 10/10/2022.   [employer] of Occupational Health - Occupational Stress Questionnaire    Feeling of Stress : Not at all  Social Connections: Socially Integrated (08/30/2021)   Received [facility] visits prior to 10/10/2022.   Social Connection and Isolation Panel    Frequency of Communication with Friends and Family: More than three times a week    Frequency of Social Gatherings with Friends and Family: Once a week    Attends Religious Services: More than 4 times per year    Active Member of [employer] or Organizations: Yes    Attends Banker Meetings: 1 to 4 times per year    Marital Status: Married  Catering manager Violence: Not At Risk (09/04/2022)   Received from [facility] visits prior to  10/10/2022.   Humiliation, Afraid, Rape, and Kick questionnaire    Fear of Current or Ex-Partner: No    Emotionally Abused: No    Physically Abused: No    Sexually Abused: No    Review of Systems:  All other review of systems negative except as mentioned in the HPI.  Physical Exam: Vital signs BP 134/67   Temp (!) 97.5 F (36.4 C) (Temporal)   Resp 20   Ht 5' (1.524 m)   Wt 53.1 kg   SpO2 100%   BMI 22.85 kg/m   General:   Alert,  Well-developed, well-nourished, pleasant and cooperative in NAD Lungs:  Clear throughout to auscultation.   Heart:  Regular rate and rhythm; no murmurs, clicks, rubs,  or gallops. Abdomen:  Soft, nontender and nondistended. Normal bowel sounds.   Neuro/Psych:  Normal mood and affect. A and O x 3  Inocente Hausen, MD [facility]

## 2024-02-10 NOTE — Telephone Encounter (Signed)
 Last Fill: 10/21/2023  Eye exam: 09/16/2023 WNL   Labs: 12/30/2023 CBC and CMP are normal.   Next Visit: 06/07/2024  Last Visit: 01/05/2024  DX: Rheumatoid arthritis involving multiple sites with positive rheumatoid factor   Current Dose per office note 01/05/2024: Plaquenil  200 mg 1 tablet by mouth daily   Okay to refill Plaquenil ?

## 2024-02-10 NOTE — Op Note (Signed)
 [facility] Patient Name: Linda Berg Procedure Date: 02/10/2024 MRN: [id number] Attending MD: Inocente Hausen , MD, [id number] Date of Birth: [birth year] CSN: [id number] Age: 78 Admit Type: Outpatient Procedure:                Upper GI endoscopy Indications:              Follow-up of gastro-esophageal reflux disease,                            Surveillance for malignancy due to personal history                            of Barrett's esophagus Providers:                Inocente Hausen, MD, Darleene Bare, RN, Haskel Chris, Technician Referring MD:              Medicines:                Monitored Anesthesia Care Complications:            No immediate complications. Estimated blood loss:                            Minimal. Estimated Blood Loss:     Estimated blood loss was minimal. Procedure:                Pre-Anesthesia Assessment:                           - Prior to the procedure, a History and Physical                            was performed, and patient medications and                            allergies were reviewed. The patient's tolerance of                            previous anesthesia was also reviewed. The risks                            and benefits of the procedure and the sedation                            options and risks were discussed with the patient.                            All questions were answered, and informed consent                            was obtained. Prior Anticoagulants: The patient has                            taken no anticoagulant or  antiplatelet agents                            except for aspirin . ASA Grade Assessment: III - A                            patient with severe systemic disease. After                            reviewing the risks and benefits, the patient was                            deemed in satisfactory condition to undergo the                            procedure.                            After obtaining informed consent, the endoscope was                            passed under direct vision. Throughout the                            procedure, the patient's blood pressure, pulse, and                            oxygen saturations were monitored continuously. The                            [id number]-H190 ([id number]) Olympus endoscope was introduced                            through the mouth, and advanced to the second part                            of duodenum. The upper GI endoscopy was                            accomplished without difficulty. The patient                            tolerated the procedure well. Scope In: Scope Out: Findings:      The upper third of the esophagus and middle third of the esophagus were       normal.      There were esophageal mucosal changes secondary to established       short-segment Barrett's disease present in the lower third of the       esophagus. The maximum longitudinal extent of these mucosal changes was       1 cm in length. Mucosa was biopsied with a cold forceps for histology in       4 quadrants at intervals of 1 cm at the gastroesophageal junction. One       specimen bottle was sent to pathology.      The gastric  body, gastric antrum, cardia (on retroflexion) and gastric       fundus (on retroflexion) were normal.      The duodenal bulb and second portion of the duodenum were normal. Impression:               - Normal upper third of esophagus and middle third                            of esophagus.                           - Esophageal mucosal changes secondary to                            established short-segment Barrett's disease.                            Biopsied.                           - Normal gastric body, antrum, cardia and gastric                            fundus.                           - Normal duodenal bulb and second portion of the                            duodenum. Moderate Sedation:       Not Applicable - Patient had care per Anesthesia. Recommendation:           - Discharge patient to home (ambulatory).                           - Await pathology results.                           - Continue present medications.                           - The findings and recommendations were discussed                            with the patient.                           - Patient has a contact number available for                            emergencies. The signs and symptoms of potential                            delayed complications were discussed with the                            patient. Return to normal activities tomorrow.  Written discharge instructions were provided to the                            patient. Procedure Code(s):        --- Professional ---                           [phone], Esophagogastroduodenoscopy, flexible,                            transoral; with biopsy, single or multiple Diagnosis Code(s):        --- Professional ---                           K22.70, Barrett's esophagus without dysplasia                           K21.9, Gastro-esophageal reflux disease without                            esophagitis CPT copyright 2022 American Medical Association. All rights reserved. The codes documented in this report are preliminary and upon coder review may  be revised to meet current compliance requirements. Inocente Hausen, MD 02/10/2024 11:30:53 AM This report has been signed electronically. Number of Addenda: 0

## 2024-02-12 ENCOUNTER — Encounter (HOSPITAL_COMMUNITY): Payer: Self-pay | Admitting: Pediatrics

## 2024-02-14 ENCOUNTER — Ambulatory Visit: Payer: Self-pay | Admitting: Pediatrics

## 2024-02-14 LAB — SURGICAL PATHOLOGY

## 2024-02-14 NOTE — Anesthesia Postprocedure Evaluation (Signed)
 Anesthesia Post Note  Patient: Linda Berg  Procedure(s) Performed: EGD (ESOPHAGOGASTRODUODENOSCOPY)     Patient location during evaluation: PACU Anesthesia Type: MAC Level of consciousness: awake and alert Pain management: pain level controlled Vital Signs Assessment: post-procedure vital signs reviewed and stable Respiratory status: spontaneous breathing, nonlabored ventilation, respiratory function stable and patient connected to nasal cannula oxygen Cardiovascular status: stable and blood pressure returned to baseline Postop Assessment: no apparent nausea or vomiting Anesthetic complications: no   No notable events documented.  Last Vitals:  Vitals:   02/10/24 1150 02/10/24 1156  BP: (!) 105/58 111/61  Pulse: 69 71  Resp: (!) 33 20  Temp:    SpO2: 98% 97%    Last Pain:  Vitals:   02/10/24 1156  TempSrc:   PainSc: 0-No pain                 Epifanio Lamar BRAVO

## 2024-02-29 ENCOUNTER — Other Ambulatory Visit: Payer: Self-pay | Admitting: Family Medicine

## 2024-02-29 DIAGNOSIS — Z1231 Encounter for screening mammogram for malignant neoplasm of breast: Secondary | ICD-10-CM

## 2024-04-14 ENCOUNTER — Ambulatory Visit
Admission: RE | Admit: 2024-04-14 | Discharge: 2024-04-14 | Disposition: A | Discharge status: Home / Self Care | Source: Ambulatory Visit | Attending: Family Medicine | Admitting: Family Medicine

## 2024-04-14 DIAGNOSIS — Z1231 Encounter for screening mammogram for malignant neoplasm of breast: Secondary | ICD-10-CM

## 2024-05-02 ENCOUNTER — Encounter: Payer: Self-pay | Admitting: Pediatrics

## 2024-05-24 NOTE — Progress Notes (Signed)
 Office Visit Note  Patient: Linda Berg             Date of Birth: 11-27-1945           MRN: [id number]             PCP: Debrah Josette ORN., PA-C Referring: Debrah Josette ORN., PA-C Visit Date: 06/07/2024 Occupation: Data Unavailable  Subjective:  Medication management  History of Present Illness: Linda Berg is a 78 y.o. female with seropositive rheumatoid arthritis, Sjogren's and osteoporosis.  She returns today after her last visit in May 2025.  She states she had IV Reclast  on January 21, 2024.  She did not experience any side effects.  She has been taking calcium and vitamin D .  She denies having a flare of rheumatoid arthritis.  However she continues to have discomfort in her hands and left shoulder which has been replaced.  She states she could not do exercises on a regular basis for her shoulder.  She denies any history of joint swelling.  She has Raynaud's phenomenon.  She denies digital ulcers.    Activities of Daily Living:  Patient reports morning stiffness for all day. Patient Reports nocturnal pain.  Difficulty dressing/grooming: Reports Difficulty climbing stairs: Denies Difficulty getting out of chair: Denies Difficulty using hands for taps, buttons, cutlery, and/or writing: Denies  Review of Systems  Constitutional:  Positive for fatigue.  HENT:  Positive for mouth dryness. Negative for mouth sores.   Eyes:  Positive for dryness.  Respiratory:  Negative for shortness of breath.   Cardiovascular:  Negative for chest pain and palpitations.  Gastrointestinal:  Positive for constipation. Negative for blood in stool and diarrhea.  Endocrine: Positive for increased urination.  Genitourinary:  Positive for involuntary urination.  Musculoskeletal:  Positive for joint pain, joint pain, joint swelling and morning stiffness. Negative for gait problem, myalgias, muscle weakness, muscle tenderness and myalgias.  Skin:  Positive for color  change. Negative for rash, hair loss and sensitivity to sunlight.  Allergic/Immunologic: Negative for susceptible to infections.  Neurological:  Negative for dizziness and headaches.  Hematological:  Negative for swollen glands.  Psychiatric/Behavioral:  Positive for sleep disturbance. Negative for depressed mood. The patient is not nervous/anxious.     PMFS History:  Patient Active Problem List   Diagnosis Date Noted   Age related osteoporosis 01/10/2024   Paroxysmal atrial tachycardia 05/25/2023   Irregular heart beat 09/16/2022   Language difficulty 08/17/2018   Nonintractable headache 08/17/2018   Upper airway cough syndrome 10/13/2017   Pneumomediastinum (HCC)    Pneumoperitoneum    Acute respiratory failure (HCC)    Pneumothorax, traumatic    Anaphylaxis 08/17/2017   Trochanteric bursitis of both hips 03/16/2017   Rheumatoid arthritis involving multiple sites with positive rheumatoid factor (HCC) 10/09/2016   Sicca syndrome 10/09/2016   Osteopenia of multiple sites 10/09/2016   History of scoliosis 10/09/2016   History of gastroesophageal reflux (GERD) 10/09/2016   History of hypothyroidism 10/09/2016   History of hyperlipidemia 10/09/2016   High risk medication use 10/09/2016   Primary osteoarthritis of both hands 10/09/2016   Paresthesia 10/11/2014   Hyperlipidemia 10/11/2014   Small vessel disease, 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/11/2014   Tinnitus 09/17/2014   Abdominal pain 09/05/2014   Acute cystitis 09/05/2014   Arthritis 09/05/2014   Cough 09/05/2014   Esophagitis 09/05/2014   Facial numbness 09/05/2014   Difficulty hearing 09/05/2014   H/O disease 09/05/2014   HLD (hyperlipidemia) 09/05/2014   Muscle ache  09/05/2014   Symptoms involving urinary system 09/05/2014   Awareness of heartbeats 09/05/2014   Acne erythematosa 09/05/2014   Absence of bladder continence 09/05/2014   Urinary urgency 09/05/2014   Routine general medical examination at a health care facility  09/05/2014   DYSPHAGIA 01/09/2009   HYPOTHYROIDISM 11/04/2007   DYSLIPIDEMIA 11/04/2007   GERD 11/04/2007   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/04/2007   DUODENITIS, WITHOUT HEMORRHAGE 11/04/2007   HIATAL HERNIA 11/04/2007   DIVERTICULOSIS, COLON 11/04/2007   CONSTIPATION, CHRONIC 11/04/2007   RECTAL BLEEDING 11/04/2007    Past Medical History:  Diagnosis Date   Barrett esophagus    Diverticulosis    Dyslipidemia    Facial numbness    GERD (gastroesophageal reflux disease)    Hearing loss    Hiatal hernia    Hypothyroidism    Rheumatoid arteritis (HCC)    Rotator cuff tear, left     Family History  Problem Relation Age of Onset   Heart disease Mother    Rheumatic fever Mother    Heart failure Mother    Stroke Father    Diabetes Father    Heart failure Father    Heart disease Maternal Aunt        aunt x 2   Irritable bowel syndrome Son    GER disease Son    Colon cancer Neg Hx    Past Surgical History:  Procedure Laterality Date   CATARACT EXTRACTION, BILATERAL     04/13/2019, 04/27/2019   CHEST TUBE INSERTION  08/17/2017   COLONOSCOPY  08/17/2017   ESOPHAGOGASTRODUODENOSCOPY N/A 02/10/2024   Procedure: EGD (ESOPHAGOGASTRODUODENOSCOPY);  Surgeon: Suzann Inocente HERO, MD;  Location: [facility];  Service: Gastroenterology;  Laterality: N/A;   HAMMER TOE SURGERY Left    INTRAUTERINE DEVICE INSERTION     IUD REMOVAL     KNEE ARTHROPLASTY     KNEE ARTHROSCOPY  12/25/11   left   LEFT OOPHORECTOMY  03/13/98   MANDIBLE SURGERY  9/93   pallete expansion   REVERSE SHOULDER ARTHROPLASTY Left 10/08/2023   Procedure: REVERSE SHOULDER ARTHROPLASTY;  Surgeon: Sharl Selinda Dover, MD;  Location: [facility];  Service: Orthopedics;  Laterality: Left;   TONSILLECTOMY     TUBAL LIGATION     Social History   Tobacco Use   Smoking status: Never    Passive exposure: Past   Smokeless tobacco: Never  Vaping Use   Vaping status: Never Used  Substance Use Topics   Alcohol  use: No    Alcohol /week:  0.0 standard drinks of alcohol    Drug use: No   Social History   Social History Narrative   Live with husband at home.   Left-handed.   1-2 cups per day.     Immunization History  Administered Date(s) Administered   INFLUENZA, HIGH DOSE SEASONAL PF 04/29/2016, 05/03/2017   Influenza-Unspecified 05/21/2010, 04/28/2013, 05/17/2014, 04/30/2015, 05/03/2017, 05/10/2018   PFIZER(Purple Top)SARS-COV-2 Vaccination 09/02/2019, 09/23/2019, 05/06/2020   Pfizer Covid-19 Vaccine Bivalent Booster 34yrs & up 05/23/2021   Pfizer(Comirnaty)Fall Seasonal Vaccine 12 years and older 06/07/2023   Pneumococcal Conjugate-13 03/29/2014   Pneumococcal Polysaccharide-23 05/21/2010, 09/05/2018   Zoster Recombinant(Shingrix) 12/10/2017, 04/08/2018   Zoster, Live 10/19/2007     Objective: Vital Signs: BP 105/68   Pulse 73   Temp (!) 96.5 F (35.8 C)   Resp 15   Ht 5' (1.524 m)   Wt 117 lb 12.8 oz (53.4 kg)   BMI 23.01 kg/m    Physical Exam Vitals and nursing note reviewed.  Constitutional:  Appearance: She is well-developed.  HENT:     Head: Normocephalic and atraumatic.  Eyes:     Conjunctiva/sclera: Conjunctivae normal.  Cardiovascular:     Rate and Rhythm: Normal rate and regular rhythm.     Heart sounds: Normal heart sounds.  Pulmonary:     Effort: Pulmonary effort is normal.     Breath sounds: Normal breath sounds.  Abdominal:     General: Bowel sounds are normal.     Palpations: Abdomen is soft.  Musculoskeletal:     Cervical back: Normal range of motion.  Lymphadenopathy:     Cervical: No cervical adenopathy.  Skin:    General: Skin is warm and dry.     Capillary Refill: Capillary refill takes 2 to 3 seconds.     Comments: Hands were warm to touch.  She had good capillary refill without any sclerodactyly or nailbed capillary changes.  Neurological:     Mental Status: She is alert and oriented to person, place, and time.  Psychiatric:        Behavior: Behavior normal.       Musculoskeletal Exam: She had limited lateral rotation of the cervical spine.  Thoracic kyphosis and thoracolumbar scoliosis was noted.  She had limited internal rotation of the left shoulder which was replaced.  Right shoulder joints in good range of motion.  Elbows, wrist, MCPs PIPs and DIPs with good range of motion.  Bilateral CMC, PIP and DIP thickening was noted.  Hip joints were in good range of motion.  Knee joints were in good range of motion without any warmth swelling or effusion.  There was no tenderness over ankles or MTPs.  CDAI Exam: CDAI Score: -- Patient Global: 40 / 100; Provider Global: 20 / 100 Swollen: --; Tender: -- Joint Exam 06/07/2024   No joint exam has been documented for this visit   There is currently no information documented on the homunculus. Go to the Rheumatology activity and complete the homunculus joint exam.  Investigation: No additional findings.  Imaging: No results found.  Recent Labs: Lab Results  Component Value Date   WBC 5.3 06/05/2024   HGB 13.3 06/05/2024   PLT 237 06/05/2024   NA 138 06/05/2024   K 4.2 06/05/2024   CL 101 06/05/2024   CO2 24 06/05/2024   GLUCOSE 87 06/05/2024   BUN 26 (H) 06/05/2024   CREATININE 0.67 06/05/2024   BILITOT 0.3 06/05/2024   ALKPHOS 84 12/07/2019   AST 19 06/05/2024   ALT 14 06/05/2024   PROT 6.9 06/05/2024   PROT 7.0 06/05/2024   ALBUMIN 4.4 12/07/2019   CALCIUM 9.5 06/05/2024   GFRAA 100 02/04/2021    Speciality Comments: PLQ eye exam: 09/16/2023 WNL @ [facility] Follow up in 1 year IV Reclast  01/21/24  Procedures:  No procedures performed Allergies: Ciprofloxacin, Terbinafine, and Lamisil at [powders]   Assessment / Plan:     Visit Diagnoses: Rheumatoid arthritis involving multiple sites with positive rheumatoid factor (HCC)-she continues to have some discomfort in her joints.  No synovitis was noted on the examination.  She continues to be on Plaquenil  200 mg  daily.  High risk medication use - Plaquenil  200 mg 1 tablet by mouth daily. PLQ eye exam: 09/16/2023 -June 05, 2024 CBC and CMP were normal.  Plan: CBC with Differential/Platelet, Comprehensive metabolic panel with GFR  Sjogren's syndrome with other organ involvement - Repeat ANA negative (03/27/20), Ro+, La-: June 05, 2024 SSA negative, SSB negative, RF negative, SPEP normal.  Labs were reviewed with the patient.  Raynaud's syndrome without gangrene-keeping core temperature warm and warm clothing was discussed.  She had no digital ulcers.  No nailbed capillary changes or sclerodactyly was noted.  Primary osteoarthritis of both hands-she had bilateral CMC, PIP and DIP thickening.  Joint protection muscle strengthening was discussed  S/P reverse total shoulder arthroplasty, left - Performed by Dr. Sharl on 10/08/2023.  No complications.  Spinal stenosis of cervical region-she has limited range of motion of the cervical spine.  History of scoliosis-thoracolumbar scoliosis was noted.  Age-related osteoporosis without current pathological fracture - DEXA updated on 09/15/2021: Left radius BMD 0.537 with T score -2.6.Not a good candidate for oral bisphosphonates due to history of Barrett's esophagus.  She had first IV Reclast  on January 21, 2024 without any side effects.  Calcium rich diet and vitamin D  was advised.  Vitamin D  deficiency-vitamin D  was 50 on Jan 05, 2024.  The medical problems are listed as follows:  Chronic TMJ pain  History of hyperlipidemia  History of diverticulitis  History of gastroesophageal reflux (GERD)-she is closely followed by gastroenterology.  History of hypothyroidism  Other fatigue  Orders: Orders Placed This Encounter  Procedures   CBC with Differential/Platelet   Comprehensive metabolic panel with GFR   No orders of the defined types were placed in this encounter.    Follow-Up Instructions: Return in about 5 months (around 11/05/2024) for  Rheumatoid arthritis.   Maya Nash, MD  Note - This record has been created using Animal nutritionist.  Chart creation errors have been sought, but may not always  have been located. Such creation errors do not reflect on  the standard of medical care.

## 2024-05-30 NOTE — Progress Notes (Unsigned)
 [facility] Return Visit   Referring Provider Debrah Josette ORN., PA-C [address],  [place] [address]  Primary Care Provider Debrah Josette ORN., PA-C  Patient Profile: Linda Berg is a 78 y.o. female with a past medical history noteworthy for dyslipidemia, hearing loss, hypothyroidism who returns to the [facility] for follow-up of the problem(s) noted below.  Problem List: GERD History of short segement nondysplastic Barrett's esophagus History of hiatal hernia Esophageal dysmotility on barium esophagram 07/2023 Chronic constipation Colonic diverticulosis Colonoscopy complicated by rectosigmoid perforation in 2019 treated with clips   History of Present Illness   Linda Berg was last seen in the GI office 01/20/2024  Current GI Meds  Nexium  20 mg p.o. twice daily OTC Metamucil MiraLAX   Interval History   Discussed the use of AI scribe software for clinical note transcription with the patient, who gave verbal consent to proceed.  History of Present Illness Linda Berg is a 78 year old female with TMJ issues and esophageal dysmotility who presents with swallowing difficulties and constipation.  GERD, Oropharyngeal dysphagia, Esophageal dysmotility - Continues on Nexium  20 mg p.o. 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  Prior evaluation included: - Esophagram performed in December 2024 -moderate esophageal dysmotility, presbyesophagus, GERD; no stricture - EGD 02/2024 -mucosal changes suggestive of short-segment Barrett's esophagus -biopsies negative for Barrett's esophagus; no webs, rings, strictures or stenoses   Chronic constipation - Lifelong, chronic constipation with recent worsening - Stools described as 'rabbit pellets' - Infrequent bowel movements with skipped days - No diarrhea - Miralax  use sometimes results in overly soft stools - Metamucil has become less effective recently -previously alternating Metamucil and MiraLAX  - Smooth Move Tea used with variable success - No use of Senokot or Dulcolax - History of long and twisted colon - Attempts to stay active by walking - Daily fluid intake approximately 40 ounces, with acknowledgment of possible need for increased hydration - Prior trial of Linzess resulted in diarrhea at a dose of 145 mcg -discussed possible trial of lower dose which she defers at this time - Reviewed option of bowel purge as well  GI Review of Symptoms Significant for dysphagia and constipation. Otherwise negative.  General Review of Systems  Review of systems is significant for the pertinent positives and negatives as listed per the HPI.  Full ROS is otherwise negative.  Past Medical History   Past Medical History:  Diagnosis Date   Barrett esophagus    Diverticulosis    Dyslipidemia    Facial numbness    GERD (gastroesophageal reflux disease)    Hearing loss    Hiatal hernia    Hypothyroidism    Rheumatoid arteritis (HCC)    Rotator cuff tear, left      Past Surgical History   Past Surgical History:  Procedure Laterality Date   CATARACT EXTRACTION, BILATERAL     04/13/2019, 04/27/2019   CHEST TUBE INSERTION  08/17/2017  COLONOSCOPY  08/17/2017   ESOPHAGOGASTRODUODENOSCOPY N/A 02/10/2024   Procedure: EGD (ESOPHAGOGASTRODUODENOSCOPY);   Surgeon: Suzann Inocente HERO, MD;  Location: [facility];  Service: Gastroenterology;  Laterality: N/A;   HAMMER TOE SURGERY Left    INTRAUTERINE DEVICE INSERTION     IUD REMOVAL     KNEE ARTHROPLASTY     KNEE ARTHROSCOPY  12/25/11   left   LEFT OOPHORECTOMY  03/13/98   MANDIBLE SURGERY  9/93   pallete expansion   REVERSE SHOULDER ARTHROPLASTY Left 10/08/2023   Procedure: REVERSE SHOULDER ARTHROPLASTY;  Surgeon: Sharl Selinda Dover, MD;  Location: [facility];  Service: Orthopedics;  Laterality: Left;   TONSILLECTOMY     TUBAL LIGATION       Allergies and Medications   Allergies  Allergen Reactions   Ciprofloxacin Anaphylaxis    Facial numbness   Prednisone Other (See Comments)    Stomach spasms   Terbinafine Rash   Lamisil At [Powders]    Current Meds  Medication Sig   AMBULATORY NON FORMULARY MEDICATION ALLERGY SHOTS Once a week   aspirin  81 MG tablet Take 81 mg by mouth daily.    Biotin 5000 MCG TABS Take 5,000 mcg by mouth daily.   cholecalciferol (VITAMIN D ) 1000 units tablet Take 1,000 Units by mouth daily.   cyanocobalamin (VITAMIN B12) 1000 MCG tablet Take 1,000 mcg by mouth daily.   cyclobenzaprine  (FLEXERIL ) 5 MG tablet Take 5 mg by mouth as needed for muscle spasms (TMJ).   cycloSPORINE (RESTASIS) 0.05 % ophthalmic emulsion Place 1 drop into both eyes 2 (two) times daily.    EPINEPHrine  0.3 mg/0.3 mL IJ SOAJ injection Inject 0.3 mg into the muscle as needed for anaphylaxis.   esomeprazole  (NEXIUM ) 20 MG capsule Take 20 mg by mouth 2 (two) times daily before a meal.   fexofenadine (ALLEGRA) 180 MG tablet Take 180 mg by mouth daily.   fluocinonide (LIDEX) 0.05 % external solution Apply 1 Application topically 2 (two) times daily.   fluticasone (FLONASE) 50 MCG/ACT nasal spray Place 1 spray into both nostrils daily as needed for allergies.   hydroxychloroquine  (PLAQUENIL ) 200 MG tablet TAKE 1 TABLET BY MOUTH DAILY   Krill Oil 500 MG CAPS Take 500 mg by mouth daily.    lidocaine  (LIDODERM ) 5 % as needed.   lovastatin (MEVACOR) 20 MG tablet Take 20 mg by mouth daily.   Magnesium  250 MG TABS Take 250 mg by mouth daily.   Peppermint Oil (IBGARD) 90 MG CPCR Take 1 capsule by mouth as needed.   polyethylene glycol (MIRALAX  / GLYCOLAX ) 17 g packet Take 8.5 g by mouth as needed for moderate constipation. 1/2 capful daily   Probiotic Product (PROBIOTIC DAILY PO) Take 1 tablet by mouth daily.    thyroid  (ARMOUR) 60 MG tablet Take 60 mg by mouth daily before breakfast.   vitamin C (ASCORBIC ACID) 500 MG tablet Take 1,000 mg by mouth 2 (two) times daily.    Zinc 30 MG CAPS Take 30 mg by mouth daily.     Family His   Family History  Problem Relation Age of Onset   Heart disease Mother    Rheumatic fever Mother    Heart failure Mother    Stroke Father    Diabetes Father    Heart failure Father    Heart disease Maternal Aunt        aunt x 2   Irritable bowel syndrome Son    GER disease Son    Colon cancer Neg  Hx     Social History   Social History   Tobacco Use   Smoking status: Never    Passive exposure: Past   Smokeless tobacco: Never  Vaping Use   Vaping status: Never Used  Substance Use Topics   Alcohol  use: No    Alcohol /week: 0.0 standard drinks of alcohol    Drug use: No   Linda Berg reports that she has never smoked. She has been exposed to tobacco smoke. She has never used smokeless tobacco. She reports that she does not drink alcohol  and does not use drugs.  Vital Signs and Physical Examination   Vitals:   05/31/24 0930  BP: 118/62  Pulse: 72    Body mass index is 20.79 kg/m. Weight: 117 lb 6 oz (53.2 kg)  General: Thin, petite, no acute distress Head: Normocephalic and atraumatic Eyes: Sclerae anicteric, EOMI Ears: Normal auditory acuity Mouth: No deformities or lesions noted Lungs: Clear throughout to auscultation Heart: Regular rate and rhythm; No murmurs, rubs or bruits Abdomen: Soft, mild tenderness to palpation in left  upper quadrant and non distended. No masses, hepatosplenomegaly or hernias noted. Normal Bowel sounds Rectal: Deferred Musculoskeletal: Symmetrical with no gross deformities     Review of Data   The following data was reviewed at the time of this encounter:   Laboratory Studies      Latest Ref Rng & Units 12/30/2023    2:01 PM 09/27/2023    1:11 PM 08/05/2023    2:55 PM  CBC  WBC 3.8 - 10.8 Thousand/uL 3.9  5.7  5.9   Hemoglobin 11.7 - 15.5 g/dL 87.1  86.8  86.4   Hematocrit 35.0 - 45.0 % 38.4  40.2  40.6   Platelets 140 - 400 Thousand/uL 233  244  281     No results found for: LIPASE    Latest Ref Rng & Units 01/05/2024    3:06 PM 12/30/2023    2:01 PM 09/27/2023    1:11 PM  CMP  Glucose 65 - 99 mg/dL  82  97   BUN 7 - 25 mg/dL  23  16   Creatinine 9.39 - 1.00 mg/dL  9.36  9.29   Sodium 864 - 146 mmol/L  137  136   Potassium 3.5 - 5.3 mmol/L  5.0  4.5   Chloride 98 - 110 mmol/L  101  100   CO2 20 - 32 mmol/L  30  26   Calcium 8.6 - 10.4 mg/dL  9.5  9.1   Total Protein 6.1 - 8.1 g/dL 6.7  6.8    Total Bilirubin 0.2 - 1.2 mg/dL  0.3    AST 10 - 35 U/L  19    ALT 6 - 29 U/L  17       Imaging Studies  Barium esophagram 07/27/2023 Moderate esophageal dysmotility, likely presbyesophagus. Gastroesophageal reflux with water  swallowing.    GI Procedures and Studies  EGD 02/10/2024 Endoscopic changes suggestive of short segment Barrett's esophagus measuring 1 cm  Path: Reflux esophagitis-no Barrett's esophagus  EGD/Colonoscopy 08/2017 EGD - irregular Z-line, no endoscopic abnormality to explain dysphagia, normal stomach and duodenum Colonoscopy - diverticula in sigmoid colon, mucosal tear in the rectosigmoid colon 20 cm from anal verge treated with clips  Path: Focal Barrett's esophagus  EGD/Colonoscopy 04/2007 EGD - SSBE Colonoscopy - Normal, no polyps   Clinical Impression  It is my clinical impression that Linda Berg is a 78 y.o. female  with;  GERD History of short segement nondysplastic  Barrett's esophagus History of hiatal hernia Esophageal dysmotility on barium esophagram 07/2023 Chronic constipation Colonic diverticulosis Colonoscopy complicated by rectosigmoid perforation in 2019 treated with clips  Linda Berg returns to the office today for follow-up of a history of GERD, esophageal dysmotility, hiatal hernia and short segment nondysplastic Barrett's esophagus.  Linda Berg underwent EGD 02/2024 for surveillance of Barrett's esophagus.  Mucosal changes suggestive of Barrett's esophagus were seen, however, no intestinal metaplasia was seen on biopsy.  There was only evidence of reflux esophagitis.  No webs, rings or strictures.  Given age and benign findings at the time of recent EGD, advised that she does not need to continue ongoing Barrett's surveillance.  Her GERD is currently well-controlled on Nexium  20 mg p.o. twice daily.  She is careful to avoid food triggers associated with GERD.  From a dysphagia perspective she does note ongoing difficulty with food sticking in her throat.  Reviewed that her barium esophagram 07/2023 showed esophageal dysmotility but no strictures.  Similarly, EGD did not show any strictures, webs, rings or stenoses.  Her symptoms of dysphagia seem most likely related to esophageal dysmotility.  Her problems are further compounded by her TMJ that is resulting in difficulty with her chewing her food adequately.  Reviewed the importance of opting for soft foods, liquids and chewing food well.  Her constipation symptoms have been lifelong.  At the time of her last visit symptoms are stable on a combination of MiraLAX  and Metamucil.  She did try Linzess 145 mcg in the past which resulted in diarrhea.  I suggested a trial of smooth move tea but she did not feel that it yielded significant change in her bowel habits.  Sometimes her bowel movements are small and hard as well as infrequent.  We reviewed the difference  between osmotic and stimulant laxatives.  I have encouraged her to continue with MiraLAX  and Metamucil on a regular basis and consider the addition of sennakot or Dulcolax to facilitate more frequent bowel movements.  If these measures are ineffective we can reevaluate other prescription constipation modalities such as low-dose Linzess, Amitiza, Trulance.  Her last colonoscopy was complicated by rectosigmoid perforation.  The procedure was complete to the cecum and no polyps were found.  Given her previous complication, benign findings on last colonoscopy and age no further surveillance/screening colonoscopies are planned.  Plan  Continue Nexium  20 mg p.o. twice daily OTC GERD precautions and lifestyle modifications Dietary modification for management of esophageal dysmotility-soft/liquid foods, chewing small bites, addressing TMJ issues Continue MiraLAX  1-1/2 cap daily in conjunction with Metamucil. Provided information regarding the use of senna and Dulcolax for stimulant laxative effect-if these results in cramping or loose stool can take the medication on an intermittent basis such as 1-3 times a week Discussed the possibility of performing a bowel purge in the future with subsequent laxative therapy.  Will reevaluate if this is necessary in the future. No further screening/surveillance colonoscopies or endoscopies planned given age and benign findings at the time of last EGD and colonoscopy.   Planned Follow Up 4-6 months  The patient or caregiver verbalized understanding of the material covered, with no barriers to understanding. All questions were answered. Patient or caregiver is agreeable with the plan outlined above.    It was a pleasure to see Linda Berg.  If you have any questions or concerns regarding this evaluation, do not hesitate to contact me.  Inocente Hausen, MD [facility]   I spent total of 35 minutes in both face-to-face (20 minutes  interview) and non-face-to-face  (15 minutes chart review, care coordination, documentation)  activities, excluding procedures performed, for the visit on the date of this encounter.

## 2024-05-31 ENCOUNTER — Encounter: Payer: Self-pay | Admitting: Pediatrics

## 2024-05-31 ENCOUNTER — Ambulatory Visit (INDEPENDENT_AMBULATORY_CARE_PROVIDER_SITE_OTHER): Admitting: Pediatrics

## 2024-05-31 VITALS — BP 118/62 | HR 72 | Ht 63.0 in | Wt 117.4 lb

## 2024-05-31 DIAGNOSIS — K5900 Constipation, unspecified: Secondary | ICD-10-CM

## 2024-05-31 DIAGNOSIS — K219 Gastro-esophageal reflux disease without esophagitis: Secondary | ICD-10-CM

## 2024-05-31 DIAGNOSIS — R1310 Dysphagia, unspecified: Secondary | ICD-10-CM | POA: Diagnosis not present

## 2024-05-31 NOTE — Patient Instructions (Signed)
 Please purchase the following medications over the counter and take as directed: Try Senna, Dulcolax, Miralax , or Metamucil.  Follow up in 4-6 months.  Thank you for entrusting me with your care and for choosing [facility], Dr. Inocente Hausen  _______________________________________________________  If your blood pressure at your visit was 140/90 or greater, please contact your primary care physician to follow up on this.  _______________________________________________________  If you are age 14 or older, your body mass index should be between 23-30. Your Body mass index is 20.79 kg/m. If this is out of the aforementioned range listed, please consider follow up with your Primary Care Provider.  If you are age 71 or younger, your body mass index should be between 19-25. Your Body mass index is 20.79 kg/m. If this is out of the aformentioned range listed, please consider follow up with your Primary Care Provider.   ________________________________________________________  The [facility] providers would like to encourage you to use MYCHART to communicate with providers for non-urgent requests or questions.  Due to long hold times on the telephone, sending your provider a message by [facility] may be a faster and more efficient way to get a response.  Please allow 48 business hours for a response.  Please remember that this is for non-urgent requests.  _______________________________________________________  [facility] is using a team-based approach to care.  Your team is made up of your doctor and two to three APPS. Our APPS (Nurse Practitioners and Physician Assistants) work with your physician to ensure care continuity for you. They are fully qualified to address your health concerns and develop a treatment plan. They communicate directly with your gastroenterologist to care for you. Seeing the Advanced Practice Practitioners on your physician's team can help you by  facilitating care more promptly, often allowing for earlier appointments, access to diagnostic testing, procedures, and other specialty referrals.

## 2024-06-05 ENCOUNTER — Other Ambulatory Visit: Payer: Self-pay

## 2024-06-05 DIAGNOSIS — M3509 Sicca syndrome with other organ involvement: Secondary | ICD-10-CM

## 2024-06-05 DIAGNOSIS — M0579 Rheumatoid arthritis with rheumatoid factor of multiple sites without organ or systems involvement: Secondary | ICD-10-CM

## 2024-06-05 DIAGNOSIS — Z79899 Other long term (current) drug therapy: Secondary | ICD-10-CM

## 2024-06-06 ENCOUNTER — Ambulatory Visit: Payer: Self-pay | Admitting: Physician Assistant

## 2024-06-06 NOTE — Progress Notes (Signed)
 CBC WNL ESR WNL RF negative  BUN is borderline elevated, rest of CMP WNL

## 2024-06-07 ENCOUNTER — Ambulatory Visit: Attending: Rheumatology | Admitting: Rheumatology

## 2024-06-07 ENCOUNTER — Encounter: Payer: Self-pay | Admitting: Rheumatology

## 2024-06-07 VITALS — BP 105/68 | HR 73 | Temp 96.5°F | Resp 15 | Ht 60.0 in | Wt 117.8 lb

## 2024-06-07 DIAGNOSIS — Z8719 Personal history of other diseases of the digestive system: Secondary | ICD-10-CM

## 2024-06-07 DIAGNOSIS — E559 Vitamin D deficiency, unspecified: Secondary | ICD-10-CM

## 2024-06-07 DIAGNOSIS — I7300 Raynaud's syndrome without gangrene: Secondary | ICD-10-CM

## 2024-06-07 DIAGNOSIS — M4802 Spinal stenosis, cervical region: Secondary | ICD-10-CM

## 2024-06-07 DIAGNOSIS — M0579 Rheumatoid arthritis with rheumatoid factor of multiple sites without organ or systems involvement: Secondary | ICD-10-CM | POA: Diagnosis not present

## 2024-06-07 DIAGNOSIS — M810 Age-related osteoporosis without current pathological fracture: Secondary | ICD-10-CM

## 2024-06-07 DIAGNOSIS — M19042 Primary osteoarthritis, left hand: Secondary | ICD-10-CM

## 2024-06-07 DIAGNOSIS — Z8739 Personal history of other diseases of the musculoskeletal system and connective tissue: Secondary | ICD-10-CM

## 2024-06-07 DIAGNOSIS — Z8639 Personal history of other endocrine, nutritional and metabolic disease: Secondary | ICD-10-CM

## 2024-06-07 DIAGNOSIS — Z79899 Other long term (current) drug therapy: Secondary | ICD-10-CM

## 2024-06-07 DIAGNOSIS — M26629 Arthralgia of temporomandibular joint, unspecified side: Secondary | ICD-10-CM

## 2024-06-07 DIAGNOSIS — R5383 Other fatigue: Secondary | ICD-10-CM

## 2024-06-07 DIAGNOSIS — M19041 Primary osteoarthritis, right hand: Secondary | ICD-10-CM

## 2024-06-07 DIAGNOSIS — G8929 Other chronic pain: Secondary | ICD-10-CM

## 2024-06-07 DIAGNOSIS — M3509 Sicca syndrome with other organ involvement: Secondary | ICD-10-CM | POA: Diagnosis not present

## 2024-06-07 DIAGNOSIS — Z96612 Presence of left artificial shoulder joint: Secondary | ICD-10-CM

## 2024-06-07 LAB — CBC WITH DIFFERENTIAL/PLATELET
Absolute Lymphocytes: 1251 {cells}/uL (ref 850–3900)
Absolute Monocytes: 445 {cells}/uL (ref 200–950)
Basophils Absolute: 32 {cells}/uL (ref 0–200)
Basophils Relative: 0.6 %
Eosinophils Absolute: 69 {cells}/uL (ref 15–500)
Eosinophils Relative: 1.3 %
HCT: 39.6 % (ref 35.0–45.0)
Hemoglobin: 13.3 g/dL (ref 11.7–15.5)
MCH: 32 pg (ref 27.0–33.0)
MCHC: 33.6 g/dL (ref 32.0–36.0)
MCV: 95.4 fL (ref 80.0–100.0)
MPV: 11 fL (ref 7.5–12.5)
Monocytes Relative: 8.4 %
Neutro Abs: 3503 {cells}/uL (ref 1500–7800)
Neutrophils Relative %: 66.1 %
Platelets: 237 Thousand/uL (ref 140–400)
RBC: 4.15 Million/uL (ref 3.80–5.10)
RDW: 11.7 % (ref 11.0–15.0)
Total Lymphocyte: 23.6 %
WBC: 5.3 Thousand/uL (ref 3.8–10.8)

## 2024-06-07 LAB — COMPREHENSIVE METABOLIC PANEL WITH GFR
AG Ratio: 1.9 (calc) (ref 1.0–2.5)
ALT: 14 U/L (ref 6–29)
AST: 19 U/L (ref 10–35)
Albumin: 4.5 g/dL (ref 3.6–5.1)
Alkaline phosphatase (APISO): 60 U/L (ref 37–153)
BUN/Creatinine Ratio: 39 (calc) — ABNORMAL HIGH (ref 6–22)
BUN: 26 mg/dL — ABNORMAL HIGH (ref 7–25)
CO2: 24 mmol/L (ref 20–32)
Calcium: 9.5 mg/dL (ref 8.6–10.4)
Chloride: 101 mmol/L (ref 98–110)
Creat: 0.67 mg/dL (ref 0.60–1.00)
Globulin: 2.4 g/dL (ref 1.9–3.7)
Glucose, Bld: 87 mg/dL (ref 65–99)
Potassium: 4.2 mmol/L (ref 3.5–5.3)
Sodium: 138 mmol/L (ref 135–146)
Total Bilirubin: 0.3 mg/dL (ref 0.2–1.2)
Total Protein: 6.9 g/dL (ref 6.1–8.1)
eGFR: 89 mL/min/1.73m2 (ref >=60)

## 2024-06-07 LAB — ANTI-NUCLEAR AB-TITER (ANA TITER): ANA Titer 1: 1:40 {titer} — ABNORMAL HIGH

## 2024-06-07 LAB — PROTEIN ELECTROPHORESIS, SERUM, WITH REFLEX
Albumin ELP: 4.4 g/dL (ref 3.8–4.8)
Alpha 1: 0.3 g/dL (ref 0.2–0.3)
Alpha 2: 0.8 g/dL (ref 0.5–0.9)
Beta 2: 0.3 g/dL (ref 0.2–0.5)
Beta Globulin: 0.4 g/dL (ref 0.4–0.6)
Gamma Globulin: 0.8 g/dL (ref 0.8–1.7)
Total Protein: 7 g/dL (ref 6.1–8.1)

## 2024-06-07 LAB — SJOGRENS SYNDROME-A EXTRACTABLE NUCLEAR ANTIBODY: SSA (Ro) (ENA) Antibody, IgG: <1 AI

## 2024-06-07 LAB — C3 AND C4
C3 Complement: 120 mg/dL (ref 83–193)
C4 Complement: 24 mg/dL (ref 15–57)

## 2024-06-07 LAB — ANA: Anti Nuclear Antibody (ANA): POSITIVE — AB

## 2024-06-07 LAB — RHEUMATOID FACTOR: Rheumatoid fact SerPl-aCnc: <10 [IU]/mL (ref <14)

## 2024-06-07 LAB — SEDIMENTATION RATE: Sed Rate: 11 mm/h (ref 0–30)

## 2024-06-07 LAB — SJOGRENS SYNDROME-B EXTRACTABLE NUCLEAR ANTIBODY: SSB (La) (ENA) Antibody, IgG: <1 AI

## 2024-06-07 NOTE — Patient Instructions (Signed)

## 2024-06-08 NOTE — Progress Notes (Signed)
 ANA is positive--very low titer-nonspecific.  Ro and La negative SPEP normal

## 2024-06-09 ENCOUNTER — Other Ambulatory Visit: Payer: Self-pay | Admitting: Physician Assistant

## 2024-06-09 DIAGNOSIS — M0579 Rheumatoid arthritis with rheumatoid factor of multiple sites without organ or systems involvement: Secondary | ICD-10-CM

## 2024-06-09 NOTE — Telephone Encounter (Signed)
 Last Fill: 02/10/2024  Eye exam: 09/16/2023   Labs: 06/05/2024 CBC WNL ESR WNL RF negative  BUN is borderline elevated, rest of CMP WNL ANA is positive--very low titer-nonspecific.  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   Okay to refill Plaquenil ?

## 2024-07-26 ENCOUNTER — Other Ambulatory Visit: Payer: Self-pay | Admitting: Rheumatology

## 2024-07-26 DIAGNOSIS — M0579 Rheumatoid arthritis with rheumatoid factor of multiple sites without organ or systems involvement: Secondary | ICD-10-CM

## 2024-09-04 ENCOUNTER — Other Ambulatory Visit: Payer: Self-pay | Admitting: Rheumatology

## 2024-09-04 DIAGNOSIS — M0579 Rheumatoid arthritis with rheumatoid factor of multiple sites without organ or systems involvement: Secondary | ICD-10-CM

## 2024-09-04 NOTE — Telephone Encounter (Signed)
 Last Fill: 06/09/2024  Eye exam: 09/16/2023   Labs: 06/05/2024 CBC WNL ESR WNL RF negative  BUN is borderline elevated, rest of CMP WNL. ANA is positive--very low titer-nonspecific.  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   Okay to refill Plaquenil ?

## 2024-09-11 ENCOUNTER — Encounter: Payer: Self-pay | Admitting: Rheumatology

## 2024-09-11 DIAGNOSIS — M0579 Rheumatoid arthritis with rheumatoid factor of multiple sites without organ or systems involvement: Secondary | ICD-10-CM

## 2024-09-11 MED ORDER — HYDROXYCHLOROQUINE SULFATE 200 MG PO TABS: 200.0000 mg | ORAL_TABLET | Freq: Every day | ORAL | 0 refills | Status: AC

## 2024-11-06 ENCOUNTER — Ambulatory Visit: Admitting: Physician Assistant
